# Patient Record
Sex: Male | Born: 1935
Health system: Southern US, Community
[De-identification: ages and names within clinical notes are randomized; demographics above are authoritative.]

## PROBLEM LIST (undated history)

## (undated) ENCOUNTER — Emergency Department (HOSPITAL_COMMUNITY): Admission: EM | Disposition: A | Discharge status: Home / Self Care | Payer: Self-pay

## (undated) DIAGNOSIS — I480 Paroxysmal atrial fibrillation: Secondary | ICD-10-CM

## (undated) DIAGNOSIS — E66811 Obesity, class 1: Secondary | ICD-10-CM

## (undated) DIAGNOSIS — K219 Gastro-esophageal reflux disease without esophagitis: Secondary | ICD-10-CM

## (undated) DIAGNOSIS — I252 Old myocardial infarction: Secondary | ICD-10-CM

## (undated) DIAGNOSIS — I119 Hypertensive heart disease without heart failure: Secondary | ICD-10-CM

## (undated) DIAGNOSIS — I4891 Unspecified atrial fibrillation: Secondary | ICD-10-CM

## (undated) DIAGNOSIS — Z9289 Personal history of other medical treatment: Secondary | ICD-10-CM

## (undated) DIAGNOSIS — I2510 Atherosclerotic heart disease of native coronary artery without angina pectoris: Secondary | ICD-10-CM

## (undated) DIAGNOSIS — I2699 Other pulmonary embolism without acute cor pulmonale: Secondary | ICD-10-CM

## (undated) DIAGNOSIS — E669 Obesity, unspecified: Secondary | ICD-10-CM

## (undated) HISTORY — DX: Atherosclerotic heart disease of native coronary artery without angina pectoris: I25.10

## (undated) HISTORY — DX: Gastro-esophageal reflux disease without esophagitis: K21.9

## (undated) HISTORY — PX: ACHILLES TENDON REPAIR: SUR1153

## (undated) HISTORY — DX: Old myocardial infarction: I25.2

## (undated) HISTORY — DX: Other pulmonary embolism without acute cor pulmonale: I26.99

## (undated) HISTORY — DX: Paroxysmal atrial fibrillation: I48.0

## (undated) HISTORY — PX: SHOULDER SURGERY: SHX246

## (undated) HISTORY — DX: Obesity, class 1: E66.811

## (undated) HISTORY — DX: Obesity, unspecified: E66.9

## (undated) HISTORY — DX: Hypertensive heart disease without heart failure: I11.9

## (undated) HISTORY — PX: APPENDECTOMY: SHX54

---

## 1993-03-08 DIAGNOSIS — I252 Old myocardial infarction: Secondary | ICD-10-CM

## 1993-03-08 HISTORY — DX: Old myocardial infarction: I25.2

## 2004-07-06 ENCOUNTER — Encounter: Admission: RE | Admit: 2004-07-06 | Discharge: 2004-07-06 | Payer: Self-pay | Admitting: Cardiology

## 2004-07-09 ENCOUNTER — Inpatient Hospital Stay (HOSPITAL_BASED_OUTPATIENT_CLINIC_OR_DEPARTMENT_OTHER): Admission: RE | Admit: 2004-07-09 | Discharge: 2004-07-09 | Payer: Self-pay | Admitting: Cardiology

## 2009-04-08 ENCOUNTER — Ambulatory Visit (HOSPITAL_COMMUNITY): Admission: RE | Admit: 2009-04-08 | Discharge: 2009-04-08 | Payer: Self-pay | Admitting: Urology

## 2010-04-05 ENCOUNTER — Encounter
Admission: RE | Admit: 2010-04-05 | Discharge: 2010-04-05 | Payer: Self-pay | Source: Home / Self Care | Attending: Orthopaedic Surgery | Admitting: Orthopaedic Surgery

## 2010-07-24 NOTE — Cardiovascular Report (Signed)
NAME:  Richard [name], Richard [name] NO.:  [id number]   MEDICAL RECORD NO.:  [id number]          PATIENT TYPE:  OIB   LOCATION:  6501                         FACILITY:  [facility]   PHYSICIAN:  W. Ashley Royalty., M.D.DATE OF BIRTH:  23-Jul-1933   DATE OF PROCEDURE:  DATE OF DISCHARGE:                              CARDIAC CATHETERIZATION   INDICATIONS:  A 77 year old male with known coronary artery disease who  presents with increasing dyspnea.  He had a previous Cardiolite study one  year ago that showed some inferior ischemia and thought due to a previous  infarction and he has a known occlusion of the right coronary artery.   PROCEDURE:  Left heart catheterization with coronary angiograms and left  ventriculogram.   COMMENTS ABOUT PROCEDURE:  The procedure was done with 4 French catheters.  He tolerated the procedure well without complications and had good  hemostasis and peripheral pulses noted at the end of the procedure.  The  femoral artery was entered using a single anterior needle wall stick.   HEMODYNAMIC DATA:  The aorta post contrast 149/78 LV, post contrast 149/18.   ANGIOGRAPHIC DATA:  Left ventriculogram:  Performed in the 30 degree RAO  projection.  The aortic valve is normal.  The mitral valve is normal.  The  left ventricle appears normal in size.  The estimated ejection fraction is  60%.  Coronary arteries arise and distribute normally.  The left main  coronary artery has some aneurysmal change and has some mild tapering at the  proximal portion but is large and is free of disease.  The left anterior  descending is a large vessel which extends around the apex.  There appears  to be some aneurysmal disease in the proximal portion of the vessel with  moderate irregularity.  A large first diagonal branch arises and a series of  several other diagonal branches arise but are free of disease.  The  circumflex coronary artery has a small intermediate branch.  There  are two  marginal arteries that arise.  Collateral filling is seen at the distal  right coronary vessel.  The right coronary artery is occluded proximally  with bridging collaterals seen.  A well developed system of collaterals in  the left coronary artery is visible.   IMPRESSION:  1.  Coronary artery disease with occlusion of right coronary artery, some      aneurysmal disease and moderate irregularity of the left anterior      descending and circumflex without significant focal obstruction of these      vessels.  2.  Normal left ventricular function.   RECOMMENDATIONS:  Continue medical therapy.  Evaluate for other causes of  dyspnea.  Possibly consider stopping beta blockers or reducing beta blockers  to give a better heart rate response with exercise.       WST/MEDQ  D:  07/09/2004  T:  07/09/2004  Job:  [id number]   cc:   Fara Chute  [address] [place]  [place] [address]  Fax: [phone]

## 2011-06-29 DIAGNOSIS — E7800 Pure hypercholesterolemia, unspecified: Secondary | ICD-10-CM | POA: Diagnosis not present

## 2011-06-29 DIAGNOSIS — E119 Type 2 diabetes mellitus without complications: Secondary | ICD-10-CM | POA: Diagnosis not present

## 2011-06-29 DIAGNOSIS — I10 Essential (primary) hypertension: Secondary | ICD-10-CM | POA: Diagnosis not present

## 2011-07-06 DIAGNOSIS — E669 Obesity, unspecified: Secondary | ICD-10-CM | POA: Diagnosis not present

## 2011-07-06 DIAGNOSIS — M1990 Unspecified osteoarthritis, unspecified site: Secondary | ICD-10-CM | POA: Diagnosis not present

## 2011-07-06 DIAGNOSIS — I10 Essential (primary) hypertension: Secondary | ICD-10-CM | POA: Diagnosis not present

## 2011-07-06 DIAGNOSIS — E7800 Pure hypercholesterolemia, unspecified: Secondary | ICD-10-CM | POA: Diagnosis not present

## 2011-07-06 DIAGNOSIS — E119 Type 2 diabetes mellitus without complications: Secondary | ICD-10-CM | POA: Diagnosis not present

## 2011-07-06 DIAGNOSIS — I259 Chronic ischemic heart disease, unspecified: Secondary | ICD-10-CM | POA: Diagnosis not present

## 2011-10-14 DIAGNOSIS — M62838 Other muscle spasm: Secondary | ICD-10-CM | POA: Diagnosis not present

## 2011-10-14 DIAGNOSIS — M549 Dorsalgia, unspecified: Secondary | ICD-10-CM | POA: Diagnosis not present

## 2011-11-02 DIAGNOSIS — I10 Essential (primary) hypertension: Secondary | ICD-10-CM | POA: Diagnosis not present

## 2011-11-02 DIAGNOSIS — E7800 Pure hypercholesterolemia, unspecified: Secondary | ICD-10-CM | POA: Diagnosis not present

## 2011-11-02 DIAGNOSIS — E119 Type 2 diabetes mellitus without complications: Secondary | ICD-10-CM | POA: Diagnosis not present

## 2011-11-09 DIAGNOSIS — L570 Actinic keratosis: Secondary | ICD-10-CM | POA: Diagnosis not present

## 2011-11-09 DIAGNOSIS — C44319 Basal cell carcinoma of skin of other parts of face: Secondary | ICD-10-CM | POA: Diagnosis not present

## 2011-11-09 DIAGNOSIS — D235 Other benign neoplasm of skin of trunk: Secondary | ICD-10-CM | POA: Diagnosis not present

## 2011-11-11 DIAGNOSIS — E7800 Pure hypercholesterolemia, unspecified: Secondary | ICD-10-CM | POA: Diagnosis not present

## 2011-11-11 DIAGNOSIS — E119 Type 2 diabetes mellitus without complications: Secondary | ICD-10-CM | POA: Diagnosis not present

## 2011-11-11 DIAGNOSIS — I10 Essential (primary) hypertension: Secondary | ICD-10-CM | POA: Diagnosis not present

## 2011-11-11 DIAGNOSIS — M1990 Unspecified osteoarthritis, unspecified site: Secondary | ICD-10-CM | POA: Diagnosis not present

## 2011-11-11 DIAGNOSIS — E669 Obesity, unspecified: Secondary | ICD-10-CM | POA: Diagnosis not present

## 2011-11-15 DIAGNOSIS — J019 Acute sinusitis, unspecified: Secondary | ICD-10-CM | POA: Diagnosis not present

## 2011-11-15 DIAGNOSIS — J209 Acute bronchitis, unspecified: Secondary | ICD-10-CM | POA: Diagnosis not present

## 2011-12-21 DIAGNOSIS — Z85828 Personal history of other malignant neoplasm of skin: Secondary | ICD-10-CM | POA: Diagnosis not present

## 2011-12-23 DIAGNOSIS — Z23 Encounter for immunization: Secondary | ICD-10-CM | POA: Diagnosis not present

## 2012-01-21 ENCOUNTER — Inpatient Hospital Stay (HOSPITAL_COMMUNITY)
Admission: AD | Admit: 2012-01-21 | Discharge: 2012-01-24 | DRG: 287 | Disposition: A | Discharge status: Home / Self Care | Payer: Medicare Other | Source: Other Acute Inpatient Hospital | Attending: Cardiology | Admitting: Cardiology

## 2012-01-21 ENCOUNTER — Encounter: Payer: Self-pay | Admitting: Cardiology

## 2012-01-21 DIAGNOSIS — Z9089 Acquired absence of other organs: Secondary | ICD-10-CM | POA: Diagnosis not present

## 2012-01-21 DIAGNOSIS — Z7982 Long term (current) use of aspirin: Secondary | ICD-10-CM

## 2012-01-21 DIAGNOSIS — E119 Type 2 diabetes mellitus without complications: Secondary | ICD-10-CM | POA: Diagnosis present

## 2012-01-21 DIAGNOSIS — Z79899 Other long term (current) drug therapy: Secondary | ICD-10-CM | POA: Diagnosis not present

## 2012-01-21 DIAGNOSIS — R109 Unspecified abdominal pain: Secondary | ICD-10-CM | POA: Diagnosis not present

## 2012-01-21 DIAGNOSIS — K219 Gastro-esophageal reflux disease without esophagitis: Secondary | ICD-10-CM | POA: Diagnosis present

## 2012-01-21 DIAGNOSIS — J9819 Other pulmonary collapse: Secondary | ICD-10-CM | POA: Diagnosis not present

## 2012-01-21 DIAGNOSIS — I119 Hypertensive heart disease without heart failure: Secondary | ICD-10-CM | POA: Diagnosis present

## 2012-01-21 DIAGNOSIS — R141 Gas pain: Secondary | ICD-10-CM | POA: Diagnosis not present

## 2012-01-21 DIAGNOSIS — E669 Obesity, unspecified: Secondary | ICD-10-CM | POA: Diagnosis present

## 2012-01-21 DIAGNOSIS — I2582 Chronic total occlusion of coronary artery: Secondary | ICD-10-CM | POA: Diagnosis present

## 2012-01-21 DIAGNOSIS — Z888 Allergy status to other drugs, medicaments and biological substances status: Secondary | ICD-10-CM

## 2012-01-21 DIAGNOSIS — E785 Hyperlipidemia, unspecified: Secondary | ICD-10-CM | POA: Diagnosis present

## 2012-01-21 DIAGNOSIS — I10 Essential (primary) hypertension: Secondary | ICD-10-CM | POA: Diagnosis not present

## 2012-01-21 DIAGNOSIS — I200 Unstable angina: Secondary | ICD-10-CM

## 2012-01-21 DIAGNOSIS — Z87891 Personal history of nicotine dependence: Secondary | ICD-10-CM

## 2012-01-21 DIAGNOSIS — I252 Old myocardial infarction: Secondary | ICD-10-CM

## 2012-01-21 DIAGNOSIS — M79609 Pain in unspecified limb: Secondary | ICD-10-CM | POA: Diagnosis not present

## 2012-01-21 DIAGNOSIS — I509 Heart failure, unspecified: Secondary | ICD-10-CM | POA: Diagnosis not present

## 2012-01-21 DIAGNOSIS — I2510 Atherosclerotic heart disease of native coronary artery without angina pectoris: Secondary | ICD-10-CM | POA: Insufficient documentation

## 2012-01-21 DIAGNOSIS — I498 Other specified cardiac arrhythmias: Secondary | ICD-10-CM | POA: Diagnosis present

## 2012-01-21 DIAGNOSIS — Z6832 Body mass index (BMI) 32.0-32.9, adult: Secondary | ICD-10-CM | POA: Diagnosis not present

## 2012-01-21 DIAGNOSIS — R0789 Other chest pain: Secondary | ICD-10-CM | POA: Diagnosis not present

## 2012-01-21 LAB — COMPREHENSIVE METABOLIC PANEL
ALT: 23 U/L (ref 0–53)
BUN: 15 mg/dL (ref 6–23)
CO2: 25 mEq/L (ref 19–32)
Calcium: 9 mg/dL (ref 8.4–10.5)
Creatinine, Ser: 0.8 mg/dL (ref 0.50–1.35)
GFR calc Af Amer: >90 mL/min (ref >90)
GFR calc non Af Amer: 85 mL/min — ABNORMAL LOW (ref >90)
Glucose, Bld: 150 mg/dL — ABNORMAL HIGH (ref 70–99)
Total Protein: 6.7 g/dL (ref 6.0–8.3)

## 2012-01-21 LAB — TROPONIN I: Troponin I: <0.3 ng/mL (ref <0.30)

## 2012-01-21 LAB — PROTIME-INR
INR: 1.03 (ref 0.00–1.49)
Prothrombin Time: 13.4 seconds (ref 11.6–15.2)

## 2012-01-21 MED ORDER — HEPARIN (PORCINE) IN NACL 100-0.45 UNIT/ML-% IJ SOLN
1750.0000 [IU]/h | INTRAMUSCULAR | Status: DC
  Administered 2012-01-21: 1350 [IU]/h via INTRAVENOUS
  Administered 2012-01-22: 1700 [IU]/h via INTRAVENOUS
  Administered 2012-01-23: 1750 [IU]/h via INTRAVENOUS
  Filled 2012-01-21 (×6): qty 250

## 2012-01-21 MED ORDER — IRBESARTAN 150 MG PO TABS
150.0000 mg | ORAL_TABLET | Freq: Every day | ORAL | Status: DC
  Administered 2012-01-22 – 2012-01-24 (×3): 150 mg via ORAL
  Filled 2012-01-21 (×3): qty 1

## 2012-01-21 MED ORDER — ACETAMINOPHEN 325 MG PO TABS: 650.0000 mg | ORAL_TABLET | ORAL | Status: DC | PRN

## 2012-01-21 MED ORDER — ATENOLOL 12.5 MG HALF TABLET
12.5000 mg | ORAL_TABLET | Freq: Every day | ORAL | Status: DC
  Administered 2012-01-22 – 2012-01-24 (×3): 12.5 mg via ORAL
  Filled 2012-01-21 (×3): qty 1

## 2012-01-21 MED ORDER — HEPARIN BOLUS VIA INFUSION
4000.0000 [IU] | Freq: Once | INTRAVENOUS | Status: AC
  Administered 2012-01-21: 4000 [IU] via INTRAVENOUS
  Filled 2012-01-21: qty 4000

## 2012-01-21 MED ORDER — SODIUM CHLORIDE 0.9 % IV SOLN
250.0000 mL | INTRAVENOUS | Status: DC | PRN
  Administered 2012-01-21: 250 mL via INTRAVENOUS

## 2012-01-21 MED ORDER — NITROGLYCERIN 0.4 MG SL SUBL: 0.4000 mg | SUBLINGUAL_TABLET | SUBLINGUAL | Status: DC | PRN

## 2012-01-21 MED ORDER — ONDANSETRON HCL 4 MG/2ML IJ SOLN: 4.0000 mg | Freq: Four times a day (QID) | INTRAMUSCULAR | Status: DC | PRN

## 2012-01-21 MED ORDER — ATORVASTATIN CALCIUM 10 MG PO TABS
10.0000 mg | ORAL_TABLET | Freq: Every evening | ORAL | Status: DC
  Administered 2012-01-21 – 2012-01-23 (×3): 10 mg via ORAL
  Filled 2012-01-21 (×4): qty 1

## 2012-01-21 MED ORDER — SODIUM CHLORIDE 0.9 % IJ SOLN
3.0000 mL | Freq: Two times a day (BID) | INTRAMUSCULAR | Status: DC
  Administered 2012-01-21 – 2012-01-23 (×2): 3 mL via INTRAVENOUS

## 2012-01-21 MED ORDER — SODIUM CHLORIDE 0.9 % IJ SOLN: 3.0000 mL | INTRAMUSCULAR | Status: DC | PRN

## 2012-01-21 MED ORDER — METFORMIN HCL ER 500 MG PO TB24
500.0000 mg | ORAL_TABLET | Freq: Every day | ORAL | Status: DC
  Administered 2012-01-22 – 2012-01-23 (×2): 500 mg via ORAL
  Filled 2012-01-21 (×6): qty 1

## 2012-01-21 MED ORDER — ASPIRIN EC 81 MG PO TBEC
81.0000 mg | DELAYED_RELEASE_TABLET | Freq: Every day | ORAL | Status: DC
  Administered 2012-01-22 – 2012-01-24 (×3): 81 mg via ORAL
  Filled 2012-01-21 (×3): qty 1

## 2012-01-21 NOTE — Progress Notes (Signed)
ANTICOAGULATION CONSULT NOTE - Initial Consult  Pharmacy Consult for heparin Indication: chest pain/ACS  Allergies  Allergen Reactions  . Metoprolol     fatigue  . Niaspan (Niacin Er)     flushing    Patient Measurements: Height: 5\' 8"  (172.7 cm) Weight: 212 lb 8.4 oz (96.4 kg) IBW/kg (Calculated) : 68.4  Heparin Dosing Weight: 89 kg  Vital Signs: Temp: 98.1 F (36.7 C) (11/15 1742) Temp src: Oral (11/15 1742) BP: 159/83 mmHg (11/15 1742) Pulse Rate: 54  (11/15 1742)  Labs: No results found for this basename: HGB:2,HCT:3,PLT:3,APTT:3,LABPROT:3,INR:3,HEPARINUNFRC:3,CREATININE:3,CKTOTAL:3,CKMB:3,TROPONINI:3 in the last 72 hours  CrCl is unknown because no creatinine reading has been taken.   Medical History: Past Medical History  Diagnosis Date  . Hypertensive heart disease   . GERD (gastroesophageal reflux disease)   . Obesity (BMI 30.0-34.9)   . CAD (coronary artery disease)   . Old inferior myocardial infarction 1995    Medications:  Prescriptions prior to admission  Medication Sig Dispense Refill  . aspirin EC 81 MG tablet Take 81 mg by mouth daily.      Marland Kitchen atenolol (TENORMIN) 25 MG tablet Take 12.5 mg by mouth daily.      Marland Kitchen atorvastatin (LIPITOR) 10 MG tablet Take 10 mg by mouth every evening.      . naproxen sodium (ANAPROX) 220 MG tablet Take 440 mg by mouth daily as needed. For pain      . valsartan (DIOVAN) 160 MG tablet Take 160 mg by mouth daily.      . metFORMIN (GLUCOPHAGE-XR) 500 MG 24 hr tablet Take 500 mg by mouth daily with breakfast.        Assessment: 78 yo male with known hx CAD admitted with chest pain, awaiting cath.  Pharmacy asked to begin anticoagulation with IV heparin.  No anticoagulants PTA, baseline labs pending.  Goal of Therapy:  Heparin level 0.3-0.7 units/ml Monitor platelets by anticoagulation protocol: Yes   Plan:  1. Heparin 4000 unit IV bolus x 1. 2. Then start IV heparin gtt at 1350 units/hr. 3. Check heparin level 8  hrs after gtt starts. 4. Daily heparin level and CBC.  Orren Pietsch C 01/21/2012,7:10 PM

## 2012-01-21 NOTE — Progress Notes (Signed)
Pt arrived to unit from [facility]. VSS, BP: 159/83, P: 54, T: 98.1, R:18, 97 % RA. Pt comfortable and resting.

## 2012-01-21 NOTE — Progress Notes (Signed)
MD called upon arrival for orders.

## 2012-01-21 NOTE — H&P (Signed)
History and Physical   Admit date: 01/21/2012 Name:  Richard [name] Medical record number: [id number] DOB/Age:  Feb 14, 1934  78 y.o. male  Referring Physician:  [facility]  Primary Cardiologist:  Dr. Viann Fish  Primary Physician: Dr. Fara Chute  Chief complaint/reason for admission: Arm discomfort  HPI:  This 78 year old male has a history of an inferior infarction in 1995. He was treated medically at the time and he is subsequently had cardiac catheterization showing an occluded right coronary artery and a 50% diagonal stenosis. He has normally been asymptomatic without significant angina. Today he was sitting and had the onset of excessive belching associated with left arm discomfort which involved his entire left arm similar to his previous symptoms of his myocardial infarction except that it was his left arm instead of the right. The discomfort intensified. It was not associated with sweating or shortness of breath and not associated with mid sternal chest discomfort. [facility] was [facility] room where he was given aspirin and 2 nitroglycerin with ultimate relief after about 2 hours of pain. He was transferred to [facility] for further evaluation. He is currently pain-free. He has noted some mild dyspnea with exertion. He normally has no PND, orthopnea, syncope, or claudication. He does not normally have any significant angina.   Past Medical History  Diagnosis Date  . Hypertensive heart disease   . GERD (gastroesophageal reflux disease)   . Obesity (BMI 30.0-34.9)   . CAD (coronary artery disease)   . Old inferior myocardial infarction 1995     Past Surgical History  Procedure Date  . Appendectomy   . Shoulder surgery   . Achilles tendon repair    Allergies: is allergic to metoprolol and niaspan.   Medications: Prior to Admission medications   Medication Sig Start Date End Date Taking? Authorizing Provider  aspirin EC 81 MG  tablet Take 81 mg by mouth daily.   Yes Historical Provider, MD  atenolol (TENORMIN) 25 MG tablet Take 12.5 mg by mouth daily.   Yes Historical Provider, MD  atorvastatin (LIPITOR) 10 MG tablet Take 10 mg by mouth every evening.   Yes Historical Provider, MD  naproxen sodium (ANAPROX) 220 MG tablet Take 440 mg by mouth daily as needed. For pain   Yes Historical Provider, MD  valsartan (DIOVAN) 160 MG tablet Take 160 mg by mouth daily.   Yes Historical Provider, MD  metFORMIN (GLUCOPHAGE-XR) 500 MG 24 hr tablet Take 500 mg by mouth daily with breakfast.    Historical Provider, MD    Family History:  Family Status  Relation Status Death Age  . Father Deceased 61    suicide  . Mother Deceased 32    died of CHF  . Sister Alive     Social History:   reports that he quit smoking about 18 years ago. He has never used smokeless tobacco. He reports that he does not drink alcohol or use illicit drugs.   History   Social History Narrative   Married, retired [employer].  Works part time at the [employer]     Review of Systems: He is been obese for several years. He has had significant low back pain and has had some injections in his low back. He normally has some mild dyspnea. He has some mild indigestion. Mild nocturia. Occasional headaches. Other than as noted above, the remainder of the review of systems is normal  Physical Exam: BP 159/83  Pulse 54  Temp 98.1 F (36.7 C) (Oral)  Resp 18  Ht 5\' 8"  (1.727 m)  Wt 96.4 kg (212 lb 8.4 oz)  BMI 32.31 kg/m2  SpO2 97% General appearance: alert, cooperative, appears stated age and moderately obese Head: Normocephalic, without obvious abnormality, atraumatic Eyes: conjunctivae/corneas clear. PERRL, EOM's intact. Fundi benign. Neck: no adenopathy, no carotid bruit, no JVD and supple, symmetrical, trachea midline Lungs: clear to auscultation bilaterally Heart: regular rate and rhythm, S1, S2 normal, no murmur, click, rub or  gallop Abdomen: soft, non-tender; bowel sounds normal; no masses,  no organomegaly Rectal: deferred Extremities: extremities normal, atraumatic, no cyanosis or edema Pulses: 2+ and symmetric Skin: Skin color, texture, turgor normal. No rashes or lesions Neurologic: Grossly normal   Labs: EKG: Sinus bradycardia, nonspecific ST changes   IMPRESSIONS: 1. Unstable angina 2. Coronary artery disease with previous inferior infarction 3. Sinus bradycardia 4. Diabetes mellitus 5. Hyperlipidemia 6. Obesity  PLAN: Admit to telemetry bed. Intravenous heparin, we will hold metformin and a day or so in anticipation of cardiac catheterization. He has a previously abnormal nuclear scan so noninvasive testing would not be useful.  Signed: Darden Palmer MD [facility] Cardiology  01/21/2012, 6:37 PM

## 2012-01-22 ENCOUNTER — Encounter (HOSPITAL_COMMUNITY): Payer: Self-pay | Admitting: *Deleted

## 2012-01-22 LAB — HEMOGLOBIN A1C
Hgb A1c MFr Bld: 6.5 % — ABNORMAL HIGH (ref <5.7)
Mean Plasma Glucose: 140 mg/dL — ABNORMAL HIGH (ref <117)

## 2012-01-22 LAB — CBC
HCT: 41.9 % (ref 39.0–52.0)
MCH: 31.2 pg (ref 26.0–34.0)
MCV: 93.9 fL (ref 78.0–100.0)
RDW: 13.6 % (ref 11.5–15.5)
WBC: 7 10*3/uL (ref 4.0–10.5)

## 2012-01-22 LAB — GLUCOSE, CAPILLARY: Glucose-Capillary: 157 mg/dL — ABNORMAL HIGH (ref 70–99)

## 2012-01-22 LAB — LIPID PANEL
HDL: 36 mg/dL — ABNORMAL LOW (ref >39)
LDL Cholesterol: 81 mg/dL (ref 0–99)

## 2012-01-22 LAB — HEPARIN LEVEL (UNFRACTIONATED): Heparin Unfractionated: 0.4 IU/mL (ref 0.30–0.70)

## 2012-01-22 MED ORDER — HEPARIN BOLUS VIA INFUSION
2500.0000 [IU] | Freq: Once | INTRAVENOUS | Status: AC
  Administered 2012-01-22: 2500 [IU] via INTRAVENOUS
  Filled 2012-01-22: qty 2500

## 2012-01-22 NOTE — Progress Notes (Signed)
Admit Complaint: 48 yoM Tx to [facility] from [facility] 11/15 with excessive belching and L arm discomfort- to be evaluated for UA. Pain alleviated in [facility] ED with ASA and NTG.  AC: none PTA, heparin gtt per Rx for CP/ACS. HL in therapeutic range at 0.40 on 1700 units/hr. H/H ok, plts low at 129. Baseline INR 1.03  Plan: -Increase heparin gtt to 1750 units/hr to keep in therapeutic range -8 hour HL to confirm -Daily HL and CBC -F/u s/sx bleeding  ANTICOAGULATION CONSULT NOTE - Follow Up Consult  Pharmacy Consult for heparin Indication: chest pain/ACS  Allergies  Allergen Reactions  . Metoprolol     fatigue  . Niaspan (Niacin Er)     flushing    Patient Measurements: Height: 5\' 8"  (172.7 cm) Weight: 212 lb 8.4 oz (96.4 kg) IBW/kg (Calculated) : 68.4  Heparin Dosing Weight: 89 kg  Vital Signs: Temp: 98.5 F (36.9 C) (11/16 1408) Temp src: Oral (11/16 1408) BP: 116/71 mmHg (11/16 1408) Pulse Rate: 56  (11/16 1408)  Labs:  Basename 01/22/12 1406 01/22/12 0515 01/22/12 0010 01/21/12 2030 01/21/12 2028  HGB -- 13.9 -- -- --  HCT -- 41.9 -- -- --  PLT -- 129* -- -- --  APTT -- -- -- -- 30  LABPROT -- -- -- -- 13.4  INR -- -- -- -- 1.03  HEPARINUNFRC 0.40 0.13* -- -- --  CREATININE -- -- -- -- 0.80  CKTOTAL -- -- -- -- --  CKMB -- -- -- -- --  TROPONINI -- <0.30 <0.30 <0.30 --    Estimated Creatinine Clearance: 88.4 ml/min (by C-G formula based on Cr of 0.8).    Assessment: 78 yoM Tx to [facility] from [facility] 11/15 with excessive belching and L arm discomfort- to be evaluated for UA. Pain alleviated in [facility] ED with ASA and NTG.  No anticoagulation PTA. Heparin gtt per Rx for CP/ACS. HL in therapeutic range at 0.40 on 1700 units/hr. H/H ok, plts low at 129. Baseline INR 1.03  Goal of Therapy:  Heparin level 0.3-0.7 units/ml Monitor platelets by anticoagulation protocol: Yes   Plan:  -Increase heparin gtt to 1750 units/hr to keep in therapeutic range -8 hour HL to  confirm -Daily HL and CBC -F/u s/sx bleeding  Thank you for the consult.  Tomi Bamberger, PharmD Clinical Pharmacist Pager: [phone] Pharmacy: [phone] 01/22/2012 3:11 PM

## 2012-01-22 NOTE — Progress Notes (Signed)
ANTICOAGULATION CONSULT NOTE - Follow Up Consult  Pharmacy Consult for heparin Indication: chest pain/ACS  Allergies  Allergen Reactions  . Metoprolol     fatigue  . Niaspan (Niacin Er)     flushing    Patient Measurements: Height: 5\' 8"  (172.7 cm) Weight: 212 lb 8.4 oz (96.4 kg) IBW/kg (Calculated) : 68.4  Heparin Dosing Weight: 89 kg  Vital Signs: Temp: 98.3 F (36.8 C) (11/15 2100) BP: 140/84 mmHg (11/15 2100) Pulse Rate: 51  (11/15 2100)  Labs:  Basename 01/22/12 0515 01/22/12 0010 01/21/12 2030 01/21/12 2028  HGB 13.9 -- -- --  HCT 41.9 -- -- --  PLT 129* -- -- --  APTT -- -- -- 30  LABPROT -- -- -- 13.4  INR -- -- -- 1.03  HEPARINUNFRC 0.13* -- -- --  CREATININE -- -- -- 0.80  CKTOTAL -- -- -- --  CKMB -- -- -- --  TROPONINI -- <0.30 <0.30 --    Estimated Creatinine Clearance: 88.4 ml/min (by C-G formula based on Cr of 0.8).   Medical History: Past Medical History  Diagnosis Date  . Hypertensive heart disease   . GERD (gastroesophageal reflux disease)   . Obesity (BMI 30.0-34.9)   . CAD (coronary artery disease)   . Old inferior myocardial infarction 1995    Medications:  Prescriptions prior to admission  Medication Sig Dispense Refill  . aspirin EC 81 MG tablet Take 81 mg by mouth daily.      Marland Kitchen atenolol (TENORMIN) 25 MG tablet Take 12.5 mg by mouth daily.      Marland Kitchen atorvastatin (LIPITOR) 10 MG tablet Take 10 mg by mouth every evening.      . naproxen sodium (ANAPROX) 220 MG tablet Take 440 mg by mouth daily as needed. For pain      . valsartan (DIOVAN) 160 MG tablet Take 160 mg by mouth daily.      . metFORMIN (GLUCOPHAGE-XR) 500 MG 24 hr tablet Take 500 mg by mouth daily with breakfast.        Assessment: 78 yo male with known hx CAD admitted with chest pain, awaiting cath.  Pharmacy asked to manage anticoagulation with IV heparin. Heparin level (0.13) is below-goal on 1350 units/hr.   Goal of Therapy:  Heparin level 0.3-0.7 units/ml Monitor  platelets by anticoagulation protocol: Yes   Plan:  1. Heparin 2500 unit IV bolus x 1, then increase IV infusion to 1700 units/hr 2. Heparin level in 8 hours.   Emeline Gins 01/22/2012,6:31 AM

## 2012-01-22 NOTE — Progress Notes (Signed)
Subjective:  No recurrent arm pain overnight.  Enzymes negative. No arrhythmias.  Objective:  Vital Signs in the last 24 hours: BP 116/71  Pulse 58  Temp 98.3 F (36.8 C) (Oral)  Resp 15  Ht 5\' 8"  (1.727 m)  Wt 96.4 kg (212 lb 8.4 oz)  BMI 32.31 kg/m2  SpO2 97%  Physical Exam: Pleasant WM in NAD Lungs:  Clear  Cardiac:  Regular rhythm, normal S1 and S2, no S3 Extremities:  No edema present  Intake/Output from previous day:    Weight Filed Weights   01/21/12 1742  Weight: 96.4 kg (212 lb 8.4 oz)    Lab Results: Basic Metabolic Panel:  [facility] 01/21/12 2028  NA 138  K 3.8  CL 103  CO2 25  GLUCOSE 150*  BUN 15  CREATININE 0.80   CBC:  Basename 01/22/12 0515  WBC 7.0  NEUTROABS --  HGB 13.9  HCT 41.9  MCV 93.9  PLT 129*   Cardiac Enzymes:  Basename 01/22/12 0515 01/22/12 0010 01/21/12 2030  CKTOTAL -- -- --  CKMB -- -- --  CKMBINDEX -- -- --  TROPONINI <0.30 <0.30 <0.30    Telemetry: Sinus bradycardia  Assessment/Plan: 1. Unstable angina 2. CAD 3. Diabetes  Rec:  Stable on IV heparin, cath Monday. Cardiac catheterization was discussed with the patient fully including risks of myocardial infarction, death, stroke, bleeding, arrhythmia, dye allergy, renal insufficiency or bleeding.  The patient understands and is willing to proceed.    Darden Palmer  MD [facility] Cardiology  01/22/2012, 10:37 AM

## 2012-01-23 LAB — CBC
Hemoglobin: 14.7 g/dL (ref 13.0–17.0)
MCH: 31.3 pg (ref 26.0–34.0)
MCV: 94.3 fL (ref 78.0–100.0)
Platelets: 127 10*3/uL — ABNORMAL LOW (ref 150–400)
RBC: 4.7 MIL/uL (ref 4.22–5.81)

## 2012-01-23 LAB — GLUCOSE, CAPILLARY
Glucose-Capillary: 136 mg/dL — ABNORMAL HIGH (ref 70–99)
Glucose-Capillary: 142 mg/dL — ABNORMAL HIGH (ref 70–99)

## 2012-01-23 MED ORDER — DIAZEPAM 5 MG PO TABS
10.0000 mg | ORAL_TABLET | ORAL | Status: AC
  Administered 2012-01-24: 10 mg via ORAL
  Filled 2012-01-23: qty 2

## 2012-01-23 MED ORDER — SODIUM CHLORIDE 0.9 % IV SOLN
1.0000 mL/kg/h | INTRAVENOUS | Status: DC
  Administered 2012-01-24: 1 mL/kg/h via INTRAVENOUS

## 2012-01-23 NOTE — Progress Notes (Signed)
Subjective:  No recurrent arm pain overnight.  Enzymes negative. No arrhythmias.  Objective:  Vital Signs in the last 24 hours: BP 129/79  Pulse 55  Temp 97.8 F (36.6 C) (Oral)  Resp 17  Ht 5\' 8"  (1.727 m)  Wt 95.981 kg (211 lb 9.6 oz)  BMI 32.17 kg/m2  SpO2 95%  Physical Exam: Pleasant WM in NAD Lungs:  Clear  Cardiac:  Regular rhythm, normal S1 and S2, no S3 Extremities:  No edema present  Intake/Output from previous day: 11/16 0701 - 11/17 0700 In: 1775.7 [P.O.:1440; I.V.:335.7] Out: 2900 [Urine:2900]  Weight Filed Weights   01/21/12 1742 01/23/12 0546  Weight: 96.4 kg (212 lb 8.4 oz) 95.981 kg (211 lb 9.6 oz)    Lab Results: Basic Metabolic Panel:  [facility] 01/21/12 2028  NA 138  K 3.8  CL 103  CO2 25  GLUCOSE 150*  BUN 15  CREATININE 0.80   CBC:  Basename 01/23/12 0540 01/22/12 0515  WBC 7.5 7.0  NEUTROABS -- --  HGB 14.7 13.9  HCT 44.3 41.9  MCV 94.3 93.9  PLT 127* 129*   Cardiac Enzymes:  Basename 01/22/12 0515 01/22/12 0010 01/21/12 2030  CKTOTAL -- -- --  CKMB -- -- --  CKMBINDEX -- -- --  TROPONINI <0.30 <0.30 <0.30    Telemetry: Sinus bradycardia  Assessment/Plan: 1. Unstable angina 2. CAD 3. Diabetes  Rec:  Stable on IV heparin, cath in am. Cardiac catheterization was discussed with the patient fully including risks of myocardial infarction, death, stroke, bleeding, arrhythmia, dye allergy, renal insufficiency or bleeding.  The patient understands and is willing to proceed.    Darden Palmer  MD [facility] Cardiology  01/23/2012, 11:57 AM

## 2012-01-23 NOTE — Progress Notes (Signed)
Pt with diabetes.  CBG's being monitored.  Metformin will be held tomorrow for cath.  Notified Dr. Donnie Aho.  No new orders rec'd.  Will continue to monitor.  Vanna Sailer, [facility]

## 2012-01-23 NOTE — Progress Notes (Signed)
ANTICOAGULATION CONSULT NOTE - Follow Up Consult  Pharmacy Consult for heparin Indication: chest pain/ACS  Allergies  Allergen Reactions  . Metoprolol     fatigue  . Niaspan (Niacin Er)     flushing    Patient Measurements: Height: 5\' 8"  (172.7 cm) Weight: 212 lb 8.4 oz (96.4 kg) IBW/kg (Calculated) : 68.4  Heparin Dosing Weight: 89 kg  Vital Signs: Temp: 98.2 F (36.8 C) (11/16 2059) Temp src: Oral (11/16 2059) BP: 118/71 mmHg (11/16 2059) Pulse Rate: 53  (11/16 2059)  Labs:  Basename 01/22/12 2357 01/22/12 1406 01/22/12 0515 01/22/12 0010 01/21/12 2030 01/21/12 2028  HGB -- -- 13.9 -- -- --  HCT -- -- 41.9 -- -- --  PLT -- -- 129* -- -- --  APTT -- -- -- -- -- 30  LABPROT -- -- -- -- -- 13.4  INR -- -- -- -- -- 1.03  HEPARINUNFRC 0.48 0.40 0.13* -- -- --  CREATININE -- -- -- -- -- 0.80  CKTOTAL -- -- -- -- -- --  CKMB -- -- -- -- -- --  TROPONINI -- -- <0.30 <0.30 <0.30 --    Estimated Creatinine Clearance: 88.4 ml/min (by C-G formula based on Cr of 0.8).   Medical History: Past Medical History  Diagnosis Date  . Hypertensive heart disease   . GERD (gastroesophageal reflux disease)   . Obesity (BMI 30.0-34.9)   . CAD (coronary artery disease)   . Old inferior myocardial infarction 1995    Medications:  Prescriptions prior to admission  Medication Sig Dispense Refill  . aspirin EC 81 MG tablet Take 81 mg by mouth daily.      Marland Kitchen atenolol (TENORMIN) 25 MG tablet Take 12.5 mg by mouth daily.      Marland Kitchen atorvastatin (LIPITOR) 10 MG tablet Take 10 mg by mouth every evening.      . naproxen sodium (ANAPROX) 220 MG tablet Take 440 mg by mouth daily as needed. For pain      . valsartan (DIOVAN) 160 MG tablet Take 160 mg by mouth daily.      . metFORMIN (GLUCOPHAGE-XR) 500 MG 24 hr tablet Take 500 mg by mouth daily with breakfast.        Assessment: 78 yo male with known hx CAD admitted with chest pain, awaiting cath on Monday.  Pharmacy asked to manage  anticoagulation with IV heparin. Heparin level (0.48) is at-goal on 1750 units/hr.   Goal of Therapy:  Heparin level 0.3-0.7 units/ml Monitor platelets by anticoagulation protocol: Yes   Plan:  1. Continue IV heparin at 1750 units/hr. 2. Daily CBC, heparin level.  Emeline Gins 01/23/2012,12:47 AM

## 2012-01-23 NOTE — Progress Notes (Signed)
ANTICOAGULATION CONSULT NOTE - Follow Up Consult  Pharmacy Consult for Heparin Indication: chest pain/ACS  Allergies  Allergen Reactions  . Metoprolol     fatigue  . Niaspan (Niacin Er)     flushing    Patient Measurements: Height: 5\' 8"  (172.7 cm) Weight: 211 lb 9.6 oz (95.981 kg) IBW/kg (Calculated) : 68.4  Heparin Dosing Weight: 89 kg  Vital Signs: Temp: 97.8 F (36.6 C) (11/17 0546) Temp src: Oral (11/17 0546) BP: 129/79 mmHg (11/17 0546) Pulse Rate: 55  (11/17 0546)  Labs:  Basename 01/23/12 0540 01/22/12 2357 01/22/12 1406 01/22/12 0515 01/22/12 0010 01/21/12 2030 01/21/12 2028  HGB 14.7 -- -- 13.9 -- -- --  HCT 44.3 -- -- 41.9 -- -- --  PLT 127* -- -- 129* -- -- --  APTT -- -- -- -- -- -- 30  LABPROT -- -- -- -- -- -- 13.4  INR -- -- -- -- -- -- 1.03  HEPARINUNFRC 0.52 0.48 0.40 -- -- -- --  CREATININE -- -- -- -- -- -- 0.80  CKTOTAL -- -- -- -- -- -- --  CKMB -- -- -- -- -- -- --  TROPONINI -- -- -- <0.30 <0.30 <0.30 --    Estimated Creatinine Clearance: 88.2 ml/min (by C-G formula based on Cr of 0.8).    Assessment: 68 yoM Tx to [facility] from [facility] 11/15 with excessive belching and L arm discomfort- to be evaluated for unstable angina. Pain alleviated in [facility] ED with ASA and NTG.   No anticoagulation PTA. Heparin gtt per Rx for CP/ACS. Heparin level remains in therapeutic range at 0.52 on 1750 units/hr.  No infusion line/ bleeding issues per RN. H/H ok, plts low, stable at 127. Baseline INR 1.03  Goal of Therapy:  Heparin level 0.3-0.7 units/ml Monitor platelets by anticoagulation protocol: Yes   Plan:  -Continue heparin gtt at 1750 units/hr -Daily HL and CBC -F/u s/sx bleeding   Thank you for the consult.  Tomi Bamberger, PharmD Clinical Pharmacist Pager: [phone] Pharmacy: [phone] 01/23/2012 9:51 AM

## 2012-01-24 ENCOUNTER — Encounter (HOSPITAL_COMMUNITY)
Admission: AD | Disposition: A | Discharge status: Home / Self Care | Payer: Self-pay | Source: Other Acute Inpatient Hospital | Attending: Cardiology

## 2012-01-24 HISTORY — PX: LEFT HEART CATHETERIZATION WITH CORONARY ANGIOGRAM: SHX5451

## 2012-01-24 LAB — CBC
HCT: 44.1 % (ref 39.0–52.0)
Platelets: 128 10*3/uL — ABNORMAL LOW (ref 150–400)
RDW: 13.6 % (ref 11.5–15.5)
WBC: 7.4 10*3/uL (ref 4.0–10.5)

## 2012-01-24 LAB — HEPARIN LEVEL (UNFRACTIONATED): Heparin Unfractionated: 0.29 IU/mL — ABNORMAL LOW (ref 0.30–0.70)

## 2012-01-24 LAB — GLUCOSE, CAPILLARY: Glucose-Capillary: 136 mg/dL — ABNORMAL HIGH (ref 70–99)

## 2012-01-24 LAB — POCT ACTIVATED CLOTTING TIME: Activated Clotting Time: 130 seconds

## 2012-01-24 SURGERY — LEFT HEART CATHETERIZATION WITH CORONARY ANGIOGRAM
Anesthesia: LOCAL

## 2012-01-24 MED ORDER — SODIUM CHLORIDE 0.9 % IV SOLN
1.0000 mL/kg/h | INTRAVENOUS | Status: DC
  Administered 2012-01-24: 1 mL/kg/h via INTRAVENOUS

## 2012-01-24 MED ORDER — ACETAMINOPHEN 325 MG PO TABS: 650.0000 mg | ORAL_TABLET | ORAL | Status: DC | PRN

## 2012-01-24 MED ORDER — CLOPIDOGREL BISULFATE 75 MG PO TABS: 75.0000 mg | ORAL_TABLET | Freq: Every day | ORAL | Status: DC

## 2012-01-24 MED ORDER — LIDOCAINE HCL (PF) 1 % IJ SOLN
INTRAMUSCULAR | Status: AC
  Filled 2012-01-24: qty 30

## 2012-01-24 MED ORDER — METFORMIN HCL ER 500 MG PO TB24: 500.0000 mg | ORAL_TABLET | Freq: Every day | ORAL | Status: DC

## 2012-01-24 MED ORDER — NITROGLYCERIN 0.4 MG SL SUBL: 0.4000 mg | SUBLINGUAL_TABLET | SUBLINGUAL | Status: DC | PRN

## 2012-01-24 MED ORDER — NITROGLYCERIN 0.2 MG/ML ON CALL CATH LAB
INTRAVENOUS | Status: AC
  Filled 2012-01-24: qty 1

## 2012-01-24 MED ORDER — HEPARIN (PORCINE) IN NACL 2-0.9 UNIT/ML-% IJ SOLN
INTRAMUSCULAR | Status: AC
  Filled 2012-01-24: qty 1000

## 2012-01-24 NOTE — Discharge Summary (Signed)
Physician Discharge Summary  Patient ID: Richard [name] MRN: [id number] DOB/AGE: 13-Jun-1933 78 y.o.  Admit date: 01/21/2012 Discharge date: 01/24/2012  Primary Physician: Dr. Fara Chute  Primary Discharge Diagnosis: 1. Prolonged chest pain-possible unstable angina pectoris  Secondary Discharge Diagnosis:  2. Coronary artery disease with total occlusion of the right coronary arteries collaterals, mild/moderate proximal left main coronary artery disease treated medically 3. Hypertensive heart disease 4. Esophageal reflux 5. Obesity 6. Hyperlipidemia  Procedures:  Cardiac catheterization  Hospital Course: This 78 year old male has a history of an inferior infarction in 1995. He was treated medically at the time and he is subsequently had cardiac catheterization showing an occluded right coronary artery and a 50% diagonal stenosis. He has normally been asymptomatic without significant angina. Today he was sitting and had the onset of excessive belching associated with left arm discomfort which involved his entire left arm similar to his previous symptoms of his myocardial infarction except that it was his left arm instead of the right. The discomfort intensified. It was not associated with sweating or shortness of breath and not associated with mid sternal chest discomfort. He was taken to the [facility] emergency room where he was given aspirin and 2 nitroglycerin with ultimate relief after about 2 hours of pain. He was transferred to [facility] for further evaluation. He is currently pain-free. He has noted some mild dyspnea with exertion. He normally has no PND, orthopnea, syncope, or claudication. He does not normally have any significant angina.  The patient was transferred from the [facility] emergency room. Initial enzymes were negative. EKG did not show any ischemic ST changes. He was placed on intravenous heparin and remained pain-free throughout the weekend. He was taken to  the cardiac catheterization laboratory on the 18th and had findings of a 30-50% proximal left main stenosis that had progressed somewhat since his previous catheterization. The right coronary was occluded. There was minimal disease in the LAD and the circumflex. It was opted to place him on Plavix and continue medical treatment at this time. The left main is a very large caliber vessel distally. He is to resume his metformin in 2 days.   Discharge Exam: Blood pressure 110/61, pulse 54, temperature 98.3 F (36.8 C), temperature source Oral, resp. rate 16, height 5\' 8"  (1.727 m), weight 95.981 kg (211 lb 9.6 oz), SpO2 96.00%.   Catheterization site has a moderate size ecchymoses noted  Labs: CBC:   Lab Results  Component Value Date   WBC 7.4 01/24/2012   HGB 14.5 01/24/2012   HCT 44.1 01/24/2012   MCV 95.0 01/24/2012   PLT 128* 01/24/2012   CMP:  Lab 01/21/12 2028  NA 138  K 3.8  CL 103  CO2 25  BUN 15  CREATININE 0.80  CALCIUM 9.0  PROT 6.7  BILITOT 0.7  ALKPHOS 45  ALT 23  AST 21  GLUCOSE 150*   Lipid Panel     Component Value Date/Time   CHOL 141 01/22/2012 0515   TRIG 122 01/22/2012 0515   HDL 36* 01/22/2012 0515   CHOLHDL 3.9 01/22/2012 0515   VLDL 24 01/22/2012 0515   LDLCALC 81 01/22/2012 0515   Cardiac Enzymes:  Basename 01/22/12 0515 01/22/12 0010 01/21/12 2030  CKTOTAL -- -- --  CKMB -- -- --  CKMBINDEX -- -- --  TROPONINI <0.30 <0.30 <0.30   EKG: Normal EKG without ischemic changes  Discharge Medications:  [place], [place]  Home Medication Instructions FAO:[id number]   Printed on:01/24/12 1745  Medication Information                    metFORMIN (GLUCOPHAGE-XR) 500 MG 24 hr tablet Take 500 mg by mouth daily with breakfast.           atenolol (TENORMIN) 25 MG tablet Take 12.5 mg by mouth daily.           atorvastatin (LIPITOR) 10 MG tablet Take 10 mg by mouth every evening.           valsartan (DIOVAN) 160 MG tablet Take 160 mg by  mouth daily.           aspirin EC 81 MG tablet Take 81 mg by mouth daily.           naproxen sodium (ANAPROX) 220 MG tablet Take 440 mg by mouth daily as needed. For pain           nitroGLYCERIN (NITROSTAT) 0.4 MG SL tablet Place 1 tablet (0.4 mg total) under the tongue every 5 (five) minutes x 3 doses as needed for chest pain.           clopidogrel (PLAVIX) 75 MG tablet Take 1 tablet (75 mg total) by mouth daily with breakfast.             Followup plans and appointments: Followup in one week. Advance activity slowly. Call if problems with catheterization site.    Time spent with patient to include physician time. 30 minutes  Signed: Darden Palmer. MD [facility] 01/24/2012, 5:45 PM

## 2012-01-24 NOTE — H&P (View-Only) (Signed)
Subjective:  No recurrent arm pain overnight.  Enzymes negative. No arrhythmias.  Objective:  Vital Signs in the last 24 hours: BP 129/79  Pulse 55  Temp 97.8 F (36.6 C) (Oral)  Resp 17  Ht 5' 8" (1.727 m)  Wt 95.981 kg (211 lb 9.6 oz)  BMI 32.17 kg/m2  SpO2 95%  Physical Exam: Pleasant WM in NAD Lungs:  Clear  Cardiac:  Regular rhythm, normal S1 and S2, no S3 Extremities:  No edema present  Intake/Output from previous day: 11/16 0701 - 11/17 0700 In: 1775.7 [P.O.:1440; I.V.:335.7] Out: 2900 [Urine:2900]  Weight Filed Weights   01/21/12 1742 01/23/12 0546  Weight: 96.4 kg (212 lb 8.4 oz) 95.981 kg (211 lb 9.6 oz)    Lab Results: Basic Metabolic Panel:  Basename 01/21/12 2028  NA 138  K 3.8  CL 103  CO2 25  GLUCOSE 150*  BUN 15  CREATININE 0.80   CBC:  Basename 01/23/12 0540 01/22/12 0515  WBC 7.5 7.0  NEUTROABS -- --  HGB 14.7 13.9  HCT 44.3 41.9  MCV 94.3 93.9  PLT 127* 129*   Cardiac Enzymes:  Basename 01/22/12 0515 01/22/12 0010 01/21/12 2030  CKTOTAL -- -- --  CKMB -- -- --  CKMBINDEX -- -- --  TROPONINI <0.30 <0.30 <0.30    Telemetry: Sinus bradycardia  Assessment/Plan: 1. Unstable angina 2. CAD 3. Diabetes  Rec:  Stable on IV heparin, cath in am. Cardiac catheterization was discussed with the patient fully including risks of myocardial infarction, death, stroke, bleeding, arrhythmia, dye allergy, renal insufficiency or bleeding.  The patient understands and is willing to proceed.    W. Spencer Tilley, Jr.  MD FACC Cardiology  01/23/2012, 11:57 AM    

## 2012-01-24 NOTE — Interval H&P Note (Signed)
History and Physical Interval Note:  01/24/2012 7:34 AM    Patient seen and examined.  No interval change in history and exam since last note.  Stable for procedure.  Richard [name]. MD [facility]  01/24/2012

## 2012-01-24 NOTE — Op Note (Signed)
Cardiac Catheterization Report   Richard [name]    78 y.o.  male  DOB: 1933/04/06  MRN: [id number]  01/24/2012   PROCEDURE:  Left heart catheterization with selective coronary angiography, left ventriculogram.  INDICATIONS: Unstable angina pectoris. The risks, benefits, and details of the procedure were explained to the patient.  The patient verbalized understanding and wanted to proceed.  Informed written consent was obtained.  PROCEDURE TECHNIQUE:  After Xylocaine anesthesia a 64F sheath was placed in the right femoral artery with a single anterior needle wall stick.   Left coronary angiography was done using a Judkins L4 guide catheter.  Right coronary angiography was done using a Judkins R4 guide catheter.  A 30 cc ventriculogram was performed in the 30 degree RAO projection.  Tolerated the procedure well.  Sheath removed in the holding area.   CONTRAST:  Total of 75 cc.  COMPLICATIONS:  None.    HEMODYNAMICS:  Aortic postcontrast 119/70, LV postcontrast 119/6-12.  There was no gradient between the left ventricle and aorta.    ANGIOGRAPHIC DATA:    CORONARY ARTERIES:   Arise and distribute normally.  Right dominant. Mild coronary calcification is noted.  Left main coronary artery: Proximal 40% narrowing but the vessel is quite large distally  Left anterior descending: Mild irregularities throughout the vessel, has a moderate sized diagonal branch  Circumflex coronary artery: Scattered irregularities.  Right coronary artery: Occluded proximally. The distal vessel fills 3 well-developed system of collaterals from the left coronary system.  LEFT VENTRICULOGRAM:  Performed in the 30 RAO projection.  The aortic and mitral valves are normal. The left ventricle is normal in size with normal wall motion. Estimated ejection fraction is 60%.  IMPRESSIONS:  1. Mild to moderate proximal left main coronary artery stenosis, occluded right coronary  artery with good collaterals and scattered irregularities in the LAD and circumflex.  2. Normal left ventricle.  RECOMMENDATION:  Continued medical treatment at this time.   Darden Palmer MD [facility]

## 2012-02-01 DIAGNOSIS — E1159 Type 2 diabetes mellitus with other circulatory complications: Secondary | ICD-10-CM | POA: Diagnosis not present

## 2012-02-01 DIAGNOSIS — E669 Obesity, unspecified: Secondary | ICD-10-CM | POA: Diagnosis not present

## 2012-02-01 DIAGNOSIS — I252 Old myocardial infarction: Secondary | ICD-10-CM | POA: Diagnosis not present

## 2012-02-01 DIAGNOSIS — I10 Essential (primary) hypertension: Secondary | ICD-10-CM | POA: Diagnosis not present

## 2012-02-01 DIAGNOSIS — R5381 Other malaise: Secondary | ICD-10-CM | POA: Diagnosis not present

## 2012-02-01 DIAGNOSIS — E785 Hyperlipidemia, unspecified: Secondary | ICD-10-CM | POA: Diagnosis not present

## 2012-02-01 DIAGNOSIS — I2510 Atherosclerotic heart disease of native coronary artery without angina pectoris: Secondary | ICD-10-CM | POA: Diagnosis not present

## 2012-02-01 DIAGNOSIS — R5383 Other fatigue: Secondary | ICD-10-CM | POA: Diagnosis not present

## 2012-02-01 DIAGNOSIS — K219 Gastro-esophageal reflux disease without esophagitis: Secondary | ICD-10-CM | POA: Diagnosis not present

## 2012-02-15 DIAGNOSIS — E669 Obesity, unspecified: Secondary | ICD-10-CM | POA: Diagnosis not present

## 2012-02-15 DIAGNOSIS — K219 Gastro-esophageal reflux disease without esophagitis: Secondary | ICD-10-CM | POA: Diagnosis not present

## 2012-02-15 DIAGNOSIS — E785 Hyperlipidemia, unspecified: Secondary | ICD-10-CM | POA: Diagnosis not present

## 2012-02-15 DIAGNOSIS — I252 Old myocardial infarction: Secondary | ICD-10-CM | POA: Diagnosis not present

## 2012-02-15 DIAGNOSIS — E1159 Type 2 diabetes mellitus with other circulatory complications: Secondary | ICD-10-CM | POA: Diagnosis not present

## 2012-02-15 DIAGNOSIS — I10 Essential (primary) hypertension: Secondary | ICD-10-CM | POA: Diagnosis not present

## 2012-02-15 DIAGNOSIS — I2510 Atherosclerotic heart disease of native coronary artery without angina pectoris: Secondary | ICD-10-CM | POA: Diagnosis not present

## 2012-03-09 DIAGNOSIS — E119 Type 2 diabetes mellitus without complications: Secondary | ICD-10-CM | POA: Diagnosis not present

## 2012-03-16 DIAGNOSIS — I10 Essential (primary) hypertension: Secondary | ICD-10-CM | POA: Diagnosis not present

## 2012-03-16 DIAGNOSIS — E7800 Pure hypercholesterolemia, unspecified: Secondary | ICD-10-CM | POA: Diagnosis not present

## 2012-03-16 DIAGNOSIS — E119 Type 2 diabetes mellitus without complications: Secondary | ICD-10-CM | POA: Diagnosis not present

## 2012-03-16 DIAGNOSIS — N400 Enlarged prostate without lower urinary tract symptoms: Secondary | ICD-10-CM | POA: Diagnosis not present

## 2012-03-21 DIAGNOSIS — M1990 Unspecified osteoarthritis, unspecified site: Secondary | ICD-10-CM | POA: Diagnosis not present

## 2012-03-21 DIAGNOSIS — E669 Obesity, unspecified: Secondary | ICD-10-CM | POA: Diagnosis not present

## 2012-03-21 DIAGNOSIS — I2510 Atherosclerotic heart disease of native coronary artery without angina pectoris: Secondary | ICD-10-CM | POA: Diagnosis not present

## 2012-03-21 DIAGNOSIS — E7800 Pure hypercholesterolemia, unspecified: Secondary | ICD-10-CM | POA: Diagnosis not present

## 2012-03-21 DIAGNOSIS — I10 Essential (primary) hypertension: Secondary | ICD-10-CM | POA: Diagnosis not present

## 2012-03-21 DIAGNOSIS — E119 Type 2 diabetes mellitus without complications: Secondary | ICD-10-CM | POA: Diagnosis not present

## 2012-03-22 DIAGNOSIS — Z85828 Personal history of other malignant neoplasm of skin: Secondary | ICD-10-CM | POA: Diagnosis not present

## 2012-04-13 DIAGNOSIS — H402290 Chronic angle-closure glaucoma, unspecified eye, stage unspecified: Secondary | ICD-10-CM | POA: Diagnosis not present

## 2012-04-13 DIAGNOSIS — H2510 Age-related nuclear cataract, unspecified eye: Secondary | ICD-10-CM | POA: Diagnosis not present

## 2012-04-13 DIAGNOSIS — E109 Type 1 diabetes mellitus without complications: Secondary | ICD-10-CM | POA: Diagnosis not present

## 2012-06-09 DIAGNOSIS — H2510 Age-related nuclear cataract, unspecified eye: Secondary | ICD-10-CM | POA: Diagnosis not present

## 2012-06-09 DIAGNOSIS — H269 Unspecified cataract: Secondary | ICD-10-CM | POA: Diagnosis not present

## 2012-07-12 DIAGNOSIS — E7800 Pure hypercholesterolemia, unspecified: Secondary | ICD-10-CM | POA: Diagnosis not present

## 2012-07-12 DIAGNOSIS — E119 Type 2 diabetes mellitus without complications: Secondary | ICD-10-CM | POA: Diagnosis not present

## 2012-07-12 DIAGNOSIS — I10 Essential (primary) hypertension: Secondary | ICD-10-CM | POA: Diagnosis not present

## 2012-07-12 DIAGNOSIS — I2510 Atherosclerotic heart disease of native coronary artery without angina pectoris: Secondary | ICD-10-CM | POA: Diagnosis not present

## 2012-07-12 DIAGNOSIS — M1990 Unspecified osteoarthritis, unspecified site: Secondary | ICD-10-CM | POA: Diagnosis not present

## 2012-07-12 DIAGNOSIS — E669 Obesity, unspecified: Secondary | ICD-10-CM | POA: Diagnosis not present

## 2012-07-19 DIAGNOSIS — E7800 Pure hypercholesterolemia, unspecified: Secondary | ICD-10-CM | POA: Diagnosis not present

## 2012-07-19 DIAGNOSIS — M1990 Unspecified osteoarthritis, unspecified site: Secondary | ICD-10-CM | POA: Diagnosis not present

## 2012-07-19 DIAGNOSIS — E119 Type 2 diabetes mellitus without complications: Secondary | ICD-10-CM | POA: Diagnosis not present

## 2012-07-19 DIAGNOSIS — I2510 Atherosclerotic heart disease of native coronary artery without angina pectoris: Secondary | ICD-10-CM | POA: Diagnosis not present

## 2012-07-19 DIAGNOSIS — I10 Essential (primary) hypertension: Secondary | ICD-10-CM | POA: Diagnosis not present

## 2012-07-19 DIAGNOSIS — E669 Obesity, unspecified: Secondary | ICD-10-CM | POA: Diagnosis not present

## 2012-07-25 DIAGNOSIS — R21 Rash and other nonspecific skin eruption: Secondary | ICD-10-CM | POA: Diagnosis not present

## 2012-07-25 DIAGNOSIS — R42 Dizziness and giddiness: Secondary | ICD-10-CM | POA: Diagnosis not present

## 2012-08-23 DIAGNOSIS — H2510 Age-related nuclear cataract, unspecified eye: Secondary | ICD-10-CM | POA: Diagnosis not present

## 2012-08-25 DIAGNOSIS — H2510 Age-related nuclear cataract, unspecified eye: Secondary | ICD-10-CM | POA: Diagnosis not present

## 2012-08-25 DIAGNOSIS — H269 Unspecified cataract: Secondary | ICD-10-CM | POA: Diagnosis not present

## 2012-11-07 DIAGNOSIS — Z85828 Personal history of other malignant neoplasm of skin: Secondary | ICD-10-CM | POA: Diagnosis not present

## 2012-11-07 DIAGNOSIS — L570 Actinic keratosis: Secondary | ICD-10-CM | POA: Diagnosis not present

## 2012-11-15 DIAGNOSIS — I10 Essential (primary) hypertension: Secondary | ICD-10-CM | POA: Diagnosis not present

## 2012-11-15 DIAGNOSIS — E7800 Pure hypercholesterolemia, unspecified: Secondary | ICD-10-CM | POA: Diagnosis not present

## 2012-11-15 DIAGNOSIS — E119 Type 2 diabetes mellitus without complications: Secondary | ICD-10-CM | POA: Diagnosis not present

## 2012-11-22 DIAGNOSIS — M1990 Unspecified osteoarthritis, unspecified site: Secondary | ICD-10-CM | POA: Diagnosis not present

## 2012-11-22 DIAGNOSIS — E669 Obesity, unspecified: Secondary | ICD-10-CM | POA: Diagnosis not present

## 2012-11-22 DIAGNOSIS — Z23 Encounter for immunization: Secondary | ICD-10-CM | POA: Diagnosis not present

## 2012-11-22 DIAGNOSIS — I10 Essential (primary) hypertension: Secondary | ICD-10-CM | POA: Diagnosis not present

## 2012-11-22 DIAGNOSIS — E7800 Pure hypercholesterolemia, unspecified: Secondary | ICD-10-CM | POA: Diagnosis not present

## 2012-11-22 DIAGNOSIS — E119 Type 2 diabetes mellitus without complications: Secondary | ICD-10-CM | POA: Diagnosis not present

## 2012-11-22 DIAGNOSIS — I2510 Atherosclerotic heart disease of native coronary artery without angina pectoris: Secondary | ICD-10-CM | POA: Diagnosis not present

## 2013-02-13 DIAGNOSIS — I2510 Atherosclerotic heart disease of native coronary artery without angina pectoris: Secondary | ICD-10-CM | POA: Diagnosis not present

## 2013-02-13 DIAGNOSIS — K219 Gastro-esophageal reflux disease without esophagitis: Secondary | ICD-10-CM | POA: Diagnosis not present

## 2013-02-13 DIAGNOSIS — I10 Essential (primary) hypertension: Secondary | ICD-10-CM | POA: Diagnosis not present

## 2013-02-13 DIAGNOSIS — E1159 Type 2 diabetes mellitus with other circulatory complications: Secondary | ICD-10-CM | POA: Diagnosis not present

## 2013-02-13 DIAGNOSIS — E785 Hyperlipidemia, unspecified: Secondary | ICD-10-CM | POA: Diagnosis not present

## 2013-02-13 DIAGNOSIS — I252 Old myocardial infarction: Secondary | ICD-10-CM | POA: Diagnosis not present

## 2013-02-13 DIAGNOSIS — E669 Obesity, unspecified: Secondary | ICD-10-CM | POA: Diagnosis not present

## 2013-03-09 DIAGNOSIS — H6690 Otitis media, unspecified, unspecified ear: Secondary | ICD-10-CM | POA: Diagnosis not present

## 2013-03-09 DIAGNOSIS — H833X9 Noise effects on inner ear, unspecified ear: Secondary | ICD-10-CM | POA: Diagnosis not present

## 2013-03-15 DIAGNOSIS — J111 Influenza due to unidentified influenza virus with other respiratory manifestations: Secondary | ICD-10-CM | POA: Diagnosis not present

## 2013-03-15 DIAGNOSIS — R1110 Vomiting, unspecified: Secondary | ICD-10-CM | POA: Diagnosis not present

## 2013-03-19 DIAGNOSIS — E119 Type 2 diabetes mellitus without complications: Secondary | ICD-10-CM | POA: Diagnosis not present

## 2013-03-19 DIAGNOSIS — K2100 Gastro-esophageal reflux disease with esophagitis, without bleeding: Secondary | ICD-10-CM | POA: Diagnosis not present

## 2013-03-19 DIAGNOSIS — I10 Essential (primary) hypertension: Secondary | ICD-10-CM | POA: Diagnosis not present

## 2013-03-19 DIAGNOSIS — E7800 Pure hypercholesterolemia, unspecified: Secondary | ICD-10-CM | POA: Diagnosis not present

## 2013-03-23 DIAGNOSIS — H6690 Otitis media, unspecified, unspecified ear: Secondary | ICD-10-CM | POA: Diagnosis not present

## 2013-03-23 DIAGNOSIS — H903 Sensorineural hearing loss, bilateral: Secondary | ICD-10-CM | POA: Diagnosis not present

## 2013-03-26 DIAGNOSIS — IMO0001 Reserved for inherently not codable concepts without codable children: Secondary | ICD-10-CM | POA: Diagnosis not present

## 2013-03-26 DIAGNOSIS — I10 Essential (primary) hypertension: Secondary | ICD-10-CM | POA: Diagnosis not present

## 2013-03-26 DIAGNOSIS — M1990 Unspecified osteoarthritis, unspecified site: Secondary | ICD-10-CM | POA: Diagnosis not present

## 2013-03-26 DIAGNOSIS — E7800 Pure hypercholesterolemia, unspecified: Secondary | ICD-10-CM | POA: Diagnosis not present

## 2013-03-26 DIAGNOSIS — Z23 Encounter for immunization: Secondary | ICD-10-CM | POA: Diagnosis not present

## 2013-03-26 DIAGNOSIS — E669 Obesity, unspecified: Secondary | ICD-10-CM | POA: Diagnosis not present

## 2013-03-26 DIAGNOSIS — I2510 Atherosclerotic heart disease of native coronary artery without angina pectoris: Secondary | ICD-10-CM | POA: Diagnosis not present

## 2013-04-08 DIAGNOSIS — I059 Rheumatic mitral valve disease, unspecified: Secondary | ICD-10-CM | POA: Diagnosis not present

## 2013-04-08 DIAGNOSIS — R079 Chest pain, unspecified: Secondary | ICD-10-CM | POA: Diagnosis not present

## 2013-04-08 DIAGNOSIS — Z79899 Other long term (current) drug therapy: Secondary | ICD-10-CM | POA: Diagnosis not present

## 2013-04-08 DIAGNOSIS — E7800 Pure hypercholesterolemia, unspecified: Secondary | ICD-10-CM | POA: Diagnosis present

## 2013-04-08 DIAGNOSIS — Z7982 Long term (current) use of aspirin: Secondary | ICD-10-CM | POA: Diagnosis not present

## 2013-04-08 DIAGNOSIS — Z7902 Long term (current) use of antithrombotics/antiplatelets: Secondary | ICD-10-CM | POA: Diagnosis not present

## 2013-04-08 DIAGNOSIS — I10 Essential (primary) hypertension: Secondary | ICD-10-CM | POA: Diagnosis not present

## 2013-04-08 DIAGNOSIS — I2699 Other pulmonary embolism without acute cor pulmonale: Secondary | ICD-10-CM | POA: Diagnosis not present

## 2013-04-08 DIAGNOSIS — I4891 Unspecified atrial fibrillation: Secondary | ICD-10-CM | POA: Diagnosis not present

## 2013-04-08 DIAGNOSIS — I2510 Atherosclerotic heart disease of native coronary artery without angina pectoris: Secondary | ICD-10-CM | POA: Diagnosis not present

## 2013-04-08 DIAGNOSIS — I252 Old myocardial infarction: Secondary | ICD-10-CM | POA: Diagnosis not present

## 2013-04-08 DIAGNOSIS — Z87891 Personal history of nicotine dependence: Secondary | ICD-10-CM | POA: Diagnosis not present

## 2013-04-08 DIAGNOSIS — I369 Nonrheumatic tricuspid valve disorder, unspecified: Secondary | ICD-10-CM | POA: Diagnosis not present

## 2013-04-08 DIAGNOSIS — E785 Hyperlipidemia, unspecified: Secondary | ICD-10-CM | POA: Diagnosis present

## 2013-04-08 DIAGNOSIS — E119 Type 2 diabetes mellitus without complications: Secondary | ICD-10-CM | POA: Diagnosis not present

## 2013-04-08 DIAGNOSIS — I359 Nonrheumatic aortic valve disorder, unspecified: Secondary | ICD-10-CM | POA: Diagnosis not present

## 2013-05-09 DIAGNOSIS — R0602 Shortness of breath: Secondary | ICD-10-CM | POA: Diagnosis not present

## 2013-05-09 DIAGNOSIS — I4891 Unspecified atrial fibrillation: Secondary | ICD-10-CM | POA: Diagnosis not present

## 2013-05-09 DIAGNOSIS — R079 Chest pain, unspecified: Secondary | ICD-10-CM | POA: Diagnosis not present

## 2013-05-12 DIAGNOSIS — I2699 Other pulmonary embolism without acute cor pulmonale: Secondary | ICD-10-CM | POA: Diagnosis not present

## 2013-05-12 DIAGNOSIS — E7800 Pure hypercholesterolemia, unspecified: Secondary | ICD-10-CM | POA: Diagnosis not present

## 2013-05-12 DIAGNOSIS — E119 Type 2 diabetes mellitus without complications: Secondary | ICD-10-CM | POA: Diagnosis not present

## 2013-05-12 DIAGNOSIS — I4891 Unspecified atrial fibrillation: Secondary | ICD-10-CM | POA: Diagnosis not present

## 2013-05-12 DIAGNOSIS — I10 Essential (primary) hypertension: Secondary | ICD-10-CM | POA: Diagnosis not present

## 2013-05-14 DIAGNOSIS — E1139 Type 2 diabetes mellitus with other diabetic ophthalmic complication: Secondary | ICD-10-CM | POA: Diagnosis not present

## 2013-06-26 DIAGNOSIS — S60229A Contusion of unspecified hand, initial encounter: Secondary | ICD-10-CM | POA: Diagnosis not present

## 2013-06-26 DIAGNOSIS — S20219A Contusion of unspecified front wall of thorax, initial encounter: Secondary | ICD-10-CM | POA: Diagnosis not present

## 2013-07-04 DIAGNOSIS — I4891 Unspecified atrial fibrillation: Secondary | ICD-10-CM | POA: Diagnosis not present

## 2013-07-04 DIAGNOSIS — R079 Chest pain, unspecified: Secondary | ICD-10-CM | POA: Diagnosis not present

## 2013-07-09 DIAGNOSIS — I4891 Unspecified atrial fibrillation: Secondary | ICD-10-CM | POA: Diagnosis not present

## 2013-07-09 DIAGNOSIS — R0602 Shortness of breath: Secondary | ICD-10-CM | POA: Diagnosis not present

## 2013-07-24 DIAGNOSIS — M546 Pain in thoracic spine: Secondary | ICD-10-CM | POA: Diagnosis not present

## 2013-07-24 DIAGNOSIS — M5430 Sciatica, unspecified side: Secondary | ICD-10-CM | POA: Diagnosis not present

## 2013-07-24 DIAGNOSIS — M9981 Other biomechanical lesions of cervical region: Secondary | ICD-10-CM | POA: Diagnosis not present

## 2013-07-24 DIAGNOSIS — M999 Biomechanical lesion, unspecified: Secondary | ICD-10-CM | POA: Diagnosis not present

## 2013-07-24 DIAGNOSIS — M4712 Other spondylosis with myelopathy, cervical region: Secondary | ICD-10-CM | POA: Diagnosis not present

## 2013-07-27 DIAGNOSIS — I4891 Unspecified atrial fibrillation: Secondary | ICD-10-CM | POA: Diagnosis not present

## 2013-07-27 DIAGNOSIS — I259 Chronic ischemic heart disease, unspecified: Secondary | ICD-10-CM | POA: Diagnosis not present

## 2013-07-27 DIAGNOSIS — I10 Essential (primary) hypertension: Secondary | ICD-10-CM | POA: Diagnosis not present

## 2013-07-27 DIAGNOSIS — E669 Obesity, unspecified: Secondary | ICD-10-CM | POA: Diagnosis not present

## 2013-07-27 DIAGNOSIS — M1990 Unspecified osteoarthritis, unspecified site: Secondary | ICD-10-CM | POA: Diagnosis not present

## 2013-07-27 DIAGNOSIS — E7800 Pure hypercholesterolemia, unspecified: Secondary | ICD-10-CM | POA: Diagnosis not present

## 2013-07-27 DIAGNOSIS — E119 Type 2 diabetes mellitus without complications: Secondary | ICD-10-CM | POA: Diagnosis not present

## 2013-07-28 DIAGNOSIS — M545 Low back pain, unspecified: Secondary | ICD-10-CM | POA: Diagnosis not present

## 2013-08-07 DIAGNOSIS — I2510 Atherosclerotic heart disease of native coronary artery without angina pectoris: Secondary | ICD-10-CM | POA: Diagnosis not present

## 2013-08-07 DIAGNOSIS — I10 Essential (primary) hypertension: Secondary | ICD-10-CM | POA: Diagnosis not present

## 2013-08-07 DIAGNOSIS — M545 Low back pain, unspecified: Secondary | ICD-10-CM | POA: Diagnosis not present

## 2013-08-07 DIAGNOSIS — IMO0001 Reserved for inherently not codable concepts without codable children: Secondary | ICD-10-CM | POA: Diagnosis not present

## 2013-08-07 DIAGNOSIS — M1990 Unspecified osteoarthritis, unspecified site: Secondary | ICD-10-CM | POA: Diagnosis not present

## 2013-08-07 DIAGNOSIS — E669 Obesity, unspecified: Secondary | ICD-10-CM | POA: Diagnosis not present

## 2013-08-07 DIAGNOSIS — E7800 Pure hypercholesterolemia, unspecified: Secondary | ICD-10-CM | POA: Diagnosis not present

## 2013-09-05 DIAGNOSIS — M545 Low back pain, unspecified: Secondary | ICD-10-CM | POA: Diagnosis not present

## 2013-09-25 DIAGNOSIS — C44319 Basal cell carcinoma of skin of other parts of face: Secondary | ICD-10-CM | POA: Diagnosis not present

## 2013-11-06 DIAGNOSIS — L570 Actinic keratosis: Secondary | ICD-10-CM | POA: Diagnosis not present

## 2013-11-06 DIAGNOSIS — Z85828 Personal history of other malignant neoplasm of skin: Secondary | ICD-10-CM | POA: Diagnosis not present

## 2013-12-04 DIAGNOSIS — I4891 Unspecified atrial fibrillation: Secondary | ICD-10-CM | POA: Diagnosis not present

## 2013-12-04 DIAGNOSIS — R0602 Shortness of breath: Secondary | ICD-10-CM | POA: Diagnosis not present

## 2013-12-13 DIAGNOSIS — Z23 Encounter for immunization: Secondary | ICD-10-CM | POA: Diagnosis not present

## 2014-01-09 DIAGNOSIS — L570 Actinic keratosis: Secondary | ICD-10-CM | POA: Diagnosis not present

## 2014-01-09 DIAGNOSIS — H02824 Cysts of left upper eyelid: Secondary | ICD-10-CM | POA: Diagnosis not present

## 2014-01-09 DIAGNOSIS — Z08 Encounter for follow-up examination after completed treatment for malignant neoplasm: Secondary | ICD-10-CM | POA: Diagnosis not present

## 2014-01-30 DIAGNOSIS — E1165 Type 2 diabetes mellitus with hyperglycemia: Secondary | ICD-10-CM | POA: Diagnosis not present

## 2014-01-30 DIAGNOSIS — K210 Gastro-esophageal reflux disease with esophagitis: Secondary | ICD-10-CM | POA: Diagnosis not present

## 2014-01-30 DIAGNOSIS — E780 Pure hypercholesterolemia: Secondary | ICD-10-CM | POA: Diagnosis not present

## 2014-01-30 DIAGNOSIS — I10 Essential (primary) hypertension: Secondary | ICD-10-CM | POA: Diagnosis not present

## 2014-02-06 DIAGNOSIS — I10 Essential (primary) hypertension: Secondary | ICD-10-CM | POA: Diagnosis not present

## 2014-02-06 DIAGNOSIS — I482 Chronic atrial fibrillation: Secondary | ICD-10-CM | POA: Diagnosis not present

## 2014-02-06 DIAGNOSIS — Z1389 Encounter for screening for other disorder: Secondary | ICD-10-CM | POA: Diagnosis not present

## 2014-02-06 DIAGNOSIS — E1165 Type 2 diabetes mellitus with hyperglycemia: Secondary | ICD-10-CM | POA: Diagnosis not present

## 2014-02-06 DIAGNOSIS — E780 Pure hypercholesterolemia: Secondary | ICD-10-CM | POA: Diagnosis not present

## 2014-02-06 DIAGNOSIS — I259 Chronic ischemic heart disease, unspecified: Secondary | ICD-10-CM | POA: Diagnosis not present

## 2014-02-14 ENCOUNTER — Encounter (HOSPITAL_COMMUNITY): Payer: Self-pay | Admitting: Cardiology

## 2014-02-20 DIAGNOSIS — M25531 Pain in right wrist: Secondary | ICD-10-CM | POA: Diagnosis not present

## 2014-02-20 DIAGNOSIS — G5601 Carpal tunnel syndrome, right upper limb: Secondary | ICD-10-CM | POA: Diagnosis not present

## 2014-04-23 DIAGNOSIS — R918 Other nonspecific abnormal finding of lung field: Secondary | ICD-10-CM | POA: Diagnosis not present

## 2014-04-23 DIAGNOSIS — R0602 Shortness of breath: Secondary | ICD-10-CM | POA: Diagnosis not present

## 2014-04-23 DIAGNOSIS — I4891 Unspecified atrial fibrillation: Secondary | ICD-10-CM | POA: Diagnosis not present

## 2014-04-30 DIAGNOSIS — E669 Obesity, unspecified: Secondary | ICD-10-CM | POA: Diagnosis not present

## 2014-04-30 DIAGNOSIS — E119 Type 2 diabetes mellitus without complications: Secondary | ICD-10-CM | POA: Diagnosis not present

## 2014-04-30 DIAGNOSIS — R0602 Shortness of breath: Secondary | ICD-10-CM | POA: Diagnosis not present

## 2014-04-30 DIAGNOSIS — I4891 Unspecified atrial fibrillation: Secondary | ICD-10-CM | POA: Diagnosis not present

## 2014-04-30 DIAGNOSIS — Z6833 Body mass index (BMI) 33.0-33.9, adult: Secondary | ICD-10-CM | POA: Diagnosis not present

## 2014-04-30 DIAGNOSIS — I252 Old myocardial infarction: Secondary | ICD-10-CM | POA: Diagnosis not present

## 2014-04-30 DIAGNOSIS — I10 Essential (primary) hypertension: Secondary | ICD-10-CM | POA: Diagnosis not present

## 2014-04-30 DIAGNOSIS — I2510 Atherosclerotic heart disease of native coronary artery without angina pectoris: Secondary | ICD-10-CM | POA: Diagnosis not present

## 2014-04-30 DIAGNOSIS — R0789 Other chest pain: Secondary | ICD-10-CM | POA: Diagnosis not present

## 2014-04-30 DIAGNOSIS — Z79899 Other long term (current) drug therapy: Secondary | ICD-10-CM | POA: Diagnosis not present

## 2014-04-30 DIAGNOSIS — Z7982 Long term (current) use of aspirin: Secondary | ICD-10-CM | POA: Diagnosis not present

## 2014-04-30 DIAGNOSIS — Z86711 Personal history of pulmonary embolism: Secondary | ICD-10-CM | POA: Diagnosis not present

## 2014-05-07 DIAGNOSIS — Z9889 Other specified postprocedural states: Secondary | ICD-10-CM | POA: Diagnosis not present

## 2014-05-07 DIAGNOSIS — I4891 Unspecified atrial fibrillation: Secondary | ICD-10-CM | POA: Diagnosis not present

## 2014-05-30 DIAGNOSIS — K210 Gastro-esophageal reflux disease with esophagitis: Secondary | ICD-10-CM | POA: Diagnosis not present

## 2014-05-30 DIAGNOSIS — E780 Pure hypercholesterolemia: Secondary | ICD-10-CM | POA: Diagnosis not present

## 2014-05-30 DIAGNOSIS — I482 Chronic atrial fibrillation: Secondary | ICD-10-CM | POA: Diagnosis not present

## 2014-05-30 DIAGNOSIS — I10 Essential (primary) hypertension: Secondary | ICD-10-CM | POA: Diagnosis not present

## 2014-05-30 DIAGNOSIS — E1165 Type 2 diabetes mellitus with hyperglycemia: Secondary | ICD-10-CM | POA: Diagnosis not present

## 2014-06-07 DIAGNOSIS — I482 Chronic atrial fibrillation: Secondary | ICD-10-CM | POA: Diagnosis not present

## 2014-06-07 DIAGNOSIS — I10 Essential (primary) hypertension: Secondary | ICD-10-CM | POA: Diagnosis not present

## 2014-06-07 DIAGNOSIS — E1165 Type 2 diabetes mellitus with hyperglycemia: Secondary | ICD-10-CM | POA: Diagnosis not present

## 2014-06-18 DIAGNOSIS — I4891 Unspecified atrial fibrillation: Secondary | ICD-10-CM | POA: Diagnosis not present

## 2014-07-08 DIAGNOSIS — E119 Type 2 diabetes mellitus without complications: Secondary | ICD-10-CM | POA: Diagnosis not present

## 2014-07-08 DIAGNOSIS — Z961 Presence of intraocular lens: Secondary | ICD-10-CM | POA: Diagnosis not present

## 2014-07-10 DIAGNOSIS — Z08 Encounter for follow-up examination after completed treatment for malignant neoplasm: Secondary | ICD-10-CM | POA: Diagnosis not present

## 2014-07-10 DIAGNOSIS — Z85828 Personal history of other malignant neoplasm of skin: Secondary | ICD-10-CM | POA: Diagnosis not present

## 2014-07-10 DIAGNOSIS — L570 Actinic keratosis: Secondary | ICD-10-CM | POA: Diagnosis not present

## 2014-08-29 DIAGNOSIS — I259 Chronic ischemic heart disease, unspecified: Secondary | ICD-10-CM | POA: Diagnosis not present

## 2014-08-29 DIAGNOSIS — E1165 Type 2 diabetes mellitus with hyperglycemia: Secondary | ICD-10-CM | POA: Diagnosis not present

## 2014-08-29 DIAGNOSIS — K210 Gastro-esophageal reflux disease with esophagitis: Secondary | ICD-10-CM | POA: Diagnosis not present

## 2014-08-29 DIAGNOSIS — E780 Pure hypercholesterolemia: Secondary | ICD-10-CM | POA: Diagnosis not present

## 2014-09-13 DIAGNOSIS — E1165 Type 2 diabetes mellitus with hyperglycemia: Secondary | ICD-10-CM | POA: Diagnosis not present

## 2014-09-13 DIAGNOSIS — I10 Essential (primary) hypertension: Secondary | ICD-10-CM | POA: Diagnosis not present

## 2014-09-13 DIAGNOSIS — M7061 Trochanteric bursitis, right hip: Secondary | ICD-10-CM | POA: Diagnosis not present

## 2014-09-13 DIAGNOSIS — E780 Pure hypercholesterolemia: Secondary | ICD-10-CM | POA: Diagnosis not present

## 2014-09-23 DIAGNOSIS — I480 Paroxysmal atrial fibrillation: Secondary | ICD-10-CM | POA: Diagnosis not present

## 2014-09-23 DIAGNOSIS — T462X4S Poisoning by other antidysrhythmic drugs, undetermined, sequela: Secondary | ICD-10-CM | POA: Diagnosis not present

## 2014-09-23 DIAGNOSIS — R0602 Shortness of breath: Secondary | ICD-10-CM | POA: Diagnosis not present

## 2014-10-08 DIAGNOSIS — R0602 Shortness of breath: Secondary | ICD-10-CM | POA: Diagnosis not present

## 2014-10-08 DIAGNOSIS — I480 Paroxysmal atrial fibrillation: Secondary | ICD-10-CM | POA: Diagnosis not present

## 2014-10-08 DIAGNOSIS — T462X4S Poisoning by other antidysrhythmic drugs, undetermined, sequela: Secondary | ICD-10-CM | POA: Diagnosis not present

## 2014-11-27 DIAGNOSIS — M5441 Lumbago with sciatica, right side: Secondary | ICD-10-CM | POA: Diagnosis not present

## 2014-11-27 DIAGNOSIS — M25551 Pain in right hip: Secondary | ICD-10-CM | POA: Diagnosis not present

## 2014-11-28 ENCOUNTER — Other Ambulatory Visit: Payer: Self-pay | Admitting: Orthopaedic Surgery

## 2014-11-28 DIAGNOSIS — M545 Low back pain: Secondary | ICD-10-CM

## 2014-12-12 ENCOUNTER — Other Ambulatory Visit: Payer: PRIVATE HEALTH INSURANCE

## 2014-12-25 DIAGNOSIS — I252 Old myocardial infarction: Secondary | ICD-10-CM | POA: Diagnosis not present

## 2014-12-25 DIAGNOSIS — S40819A Abrasion of unspecified upper arm, initial encounter: Secondary | ICD-10-CM | POA: Diagnosis not present

## 2014-12-25 DIAGNOSIS — I10 Essential (primary) hypertension: Secondary | ICD-10-CM | POA: Diagnosis not present

## 2014-12-25 DIAGNOSIS — S50311A Abrasion of right elbow, initial encounter: Secondary | ICD-10-CM | POA: Diagnosis not present

## 2014-12-25 DIAGNOSIS — Z7982 Long term (current) use of aspirin: Secondary | ICD-10-CM | POA: Diagnosis not present

## 2014-12-25 DIAGNOSIS — Z79899 Other long term (current) drug therapy: Secondary | ICD-10-CM | POA: Diagnosis not present

## 2014-12-25 DIAGNOSIS — S0003XA Contusion of scalp, initial encounter: Secondary | ICD-10-CM | POA: Diagnosis not present

## 2014-12-25 DIAGNOSIS — S80819A Abrasion, unspecified lower leg, initial encounter: Secondary | ICD-10-CM | POA: Diagnosis not present

## 2014-12-25 DIAGNOSIS — S0990XA Unspecified injury of head, initial encounter: Secondary | ICD-10-CM | POA: Diagnosis not present

## 2014-12-25 DIAGNOSIS — S0093XA Contusion of unspecified part of head, initial encounter: Secondary | ICD-10-CM | POA: Diagnosis not present

## 2014-12-25 DIAGNOSIS — R51 Headache: Secondary | ICD-10-CM | POA: Diagnosis not present

## 2014-12-25 DIAGNOSIS — S50312A Abrasion of left elbow, initial encounter: Secondary | ICD-10-CM | POA: Diagnosis not present

## 2014-12-25 DIAGNOSIS — I4891 Unspecified atrial fibrillation: Secondary | ICD-10-CM | POA: Diagnosis not present

## 2014-12-25 DIAGNOSIS — Z86711 Personal history of pulmonary embolism: Secondary | ICD-10-CM | POA: Diagnosis not present

## 2014-12-25 DIAGNOSIS — Z7901 Long term (current) use of anticoagulants: Secondary | ICD-10-CM | POA: Diagnosis not present

## 2014-12-25 DIAGNOSIS — E119 Type 2 diabetes mellitus without complications: Secondary | ICD-10-CM | POA: Diagnosis not present

## 2014-12-25 DIAGNOSIS — W0110XA Fall on same level from slipping, tripping and stumbling with subsequent striking against unspecified object, initial encounter: Secondary | ICD-10-CM | POA: Diagnosis not present

## 2014-12-27 ENCOUNTER — Other Ambulatory Visit: Payer: PRIVATE HEALTH INSURANCE

## 2014-12-31 DIAGNOSIS — Z23 Encounter for immunization: Secondary | ICD-10-CM | POA: Diagnosis not present

## 2015-01-03 ENCOUNTER — Ambulatory Visit
Admission: RE | Admit: 2015-01-03 | Discharge: 2015-01-03 | Disposition: A | Discharge status: Home / Self Care | Payer: Medicare Other | Source: Ambulatory Visit | Attending: Orthopaedic Surgery | Admitting: Orthopaedic Surgery

## 2015-01-03 DIAGNOSIS — M545 Low back pain: Secondary | ICD-10-CM

## 2015-01-03 DIAGNOSIS — M5126 Other intervertebral disc displacement, lumbar region: Secondary | ICD-10-CM | POA: Diagnosis not present

## 2015-01-03 DIAGNOSIS — M4806 Spinal stenosis, lumbar region: Secondary | ICD-10-CM | POA: Diagnosis not present

## 2015-01-08 DIAGNOSIS — M47817 Spondylosis without myelopathy or radiculopathy, lumbosacral region: Secondary | ICD-10-CM | POA: Diagnosis not present

## 2015-01-08 DIAGNOSIS — M5417 Radiculopathy, lumbosacral region: Secondary | ICD-10-CM | POA: Diagnosis not present

## 2015-01-08 DIAGNOSIS — M5441 Lumbago with sciatica, right side: Secondary | ICD-10-CM | POA: Diagnosis not present

## 2015-01-20 DIAGNOSIS — I480 Paroxysmal atrial fibrillation: Secondary | ICD-10-CM | POA: Diagnosis not present

## 2015-02-07 DIAGNOSIS — M5136 Other intervertebral disc degeneration, lumbar region: Secondary | ICD-10-CM | POA: Diagnosis not present

## 2015-02-07 DIAGNOSIS — M9983 Other biomechanical lesions of lumbar region: Secondary | ICD-10-CM | POA: Diagnosis not present

## 2015-02-07 DIAGNOSIS — M4727 Other spondylosis with radiculopathy, lumbosacral region: Secondary | ICD-10-CM | POA: Diagnosis not present

## 2015-02-17 DIAGNOSIS — Z86711 Personal history of pulmonary embolism: Secondary | ICD-10-CM | POA: Diagnosis not present

## 2015-02-17 DIAGNOSIS — M4726 Other spondylosis with radiculopathy, lumbar region: Secondary | ICD-10-CM | POA: Diagnosis not present

## 2015-02-17 DIAGNOSIS — Z79899 Other long term (current) drug therapy: Secondary | ICD-10-CM | POA: Diagnosis not present

## 2015-02-17 DIAGNOSIS — M5136 Other intervertebral disc degeneration, lumbar region: Secondary | ICD-10-CM | POA: Diagnosis not present

## 2015-02-17 DIAGNOSIS — I10 Essential (primary) hypertension: Secondary | ICD-10-CM | POA: Diagnosis not present

## 2015-02-17 DIAGNOSIS — Z7901 Long term (current) use of anticoagulants: Secondary | ICD-10-CM | POA: Diagnosis not present

## 2015-02-17 DIAGNOSIS — Z8249 Family history of ischemic heart disease and other diseases of the circulatory system: Secondary | ICD-10-CM | POA: Diagnosis not present

## 2015-02-17 DIAGNOSIS — Z7984 Long term (current) use of oral hypoglycemic drugs: Secondary | ICD-10-CM | POA: Diagnosis not present

## 2015-02-17 DIAGNOSIS — M9983 Other biomechanical lesions of lumbar region: Secondary | ICD-10-CM | POA: Diagnosis not present

## 2015-02-17 DIAGNOSIS — E119 Type 2 diabetes mellitus without complications: Secondary | ICD-10-CM | POA: Diagnosis not present

## 2015-02-17 DIAGNOSIS — E785 Hyperlipidemia, unspecified: Secondary | ICD-10-CM | POA: Diagnosis not present

## 2015-02-17 DIAGNOSIS — Z818 Family history of other mental and behavioral disorders: Secondary | ICD-10-CM | POA: Diagnosis not present

## 2015-02-17 DIAGNOSIS — M4727 Other spondylosis with radiculopathy, lumbosacral region: Secondary | ICD-10-CM | POA: Diagnosis not present

## 2015-02-17 DIAGNOSIS — M5116 Intervertebral disc disorders with radiculopathy, lumbar region: Secondary | ICD-10-CM | POA: Diagnosis not present

## 2015-02-17 DIAGNOSIS — Z87891 Personal history of nicotine dependence: Secondary | ICD-10-CM | POA: Diagnosis not present

## 2015-02-17 DIAGNOSIS — I252 Old myocardial infarction: Secondary | ICD-10-CM | POA: Diagnosis not present

## 2015-03-04 DIAGNOSIS — M9983 Other biomechanical lesions of lumbar region: Secondary | ICD-10-CM | POA: Diagnosis not present

## 2015-03-04 DIAGNOSIS — M5136 Other intervertebral disc degeneration, lumbar region: Secondary | ICD-10-CM | POA: Diagnosis not present

## 2015-03-04 DIAGNOSIS — M4727 Other spondylosis with radiculopathy, lumbosacral region: Secondary | ICD-10-CM | POA: Diagnosis not present

## 2015-03-05 DIAGNOSIS — R944 Abnormal results of kidney function studies: Secondary | ICD-10-CM | POA: Diagnosis not present

## 2015-03-05 DIAGNOSIS — Z0000 Encounter for general adult medical examination without abnormal findings: Secondary | ICD-10-CM | POA: Diagnosis not present

## 2015-03-05 DIAGNOSIS — K210 Gastro-esophageal reflux disease with esophagitis: Secondary | ICD-10-CM | POA: Diagnosis not present

## 2015-03-05 DIAGNOSIS — E7800 Pure hypercholesterolemia, unspecified: Secondary | ICD-10-CM | POA: Diagnosis not present

## 2015-03-05 DIAGNOSIS — I482 Chronic atrial fibrillation: Secondary | ICD-10-CM | POA: Diagnosis not present

## 2015-03-05 DIAGNOSIS — N400 Enlarged prostate without lower urinary tract symptoms: Secondary | ICD-10-CM | POA: Diagnosis not present

## 2015-03-05 DIAGNOSIS — E1165 Type 2 diabetes mellitus with hyperglycemia: Secondary | ICD-10-CM | POA: Diagnosis not present

## 2015-03-05 DIAGNOSIS — I10 Essential (primary) hypertension: Secondary | ICD-10-CM | POA: Diagnosis not present

## 2015-03-05 DIAGNOSIS — I259 Chronic ischemic heart disease, unspecified: Secondary | ICD-10-CM | POA: Diagnosis not present

## 2015-03-13 DIAGNOSIS — E1165 Type 2 diabetes mellitus with hyperglycemia: Secondary | ICD-10-CM | POA: Diagnosis not present

## 2015-03-13 DIAGNOSIS — R944 Abnormal results of kidney function studies: Secondary | ICD-10-CM | POA: Diagnosis not present

## 2015-03-13 DIAGNOSIS — Z23 Encounter for immunization: Secondary | ICD-10-CM | POA: Diagnosis not present

## 2015-03-13 DIAGNOSIS — I259 Chronic ischemic heart disease, unspecified: Secondary | ICD-10-CM | POA: Diagnosis not present

## 2015-03-27 DIAGNOSIS — R2681 Unsteadiness on feet: Secondary | ICD-10-CM | POA: Diagnosis not present

## 2015-03-27 DIAGNOSIS — I480 Paroxysmal atrial fibrillation: Secondary | ICD-10-CM | POA: Diagnosis not present

## 2015-03-27 DIAGNOSIS — R42 Dizziness and giddiness: Secondary | ICD-10-CM | POA: Diagnosis not present

## 2015-03-27 DIAGNOSIS — Z9181 History of falling: Secondary | ICD-10-CM | POA: Diagnosis not present

## 2015-03-27 DIAGNOSIS — R0602 Shortness of breath: Secondary | ICD-10-CM | POA: Diagnosis not present

## 2015-03-27 DIAGNOSIS — I491 Atrial premature depolarization: Secondary | ICD-10-CM | POA: Diagnosis not present

## 2015-03-27 DIAGNOSIS — S0990XS Unspecified injury of head, sequela: Secondary | ICD-10-CM | POA: Diagnosis not present

## 2015-03-27 DIAGNOSIS — R001 Bradycardia, unspecified: Secondary | ICD-10-CM | POA: Diagnosis not present

## 2015-03-27 DIAGNOSIS — G44309 Post-traumatic headache, unspecified, not intractable: Secondary | ICD-10-CM | POA: Diagnosis not present

## 2015-04-25 DIAGNOSIS — S20212A Contusion of left front wall of thorax, initial encounter: Secondary | ICD-10-CM | POA: Diagnosis not present

## 2015-04-25 DIAGNOSIS — R079 Chest pain, unspecified: Secondary | ICD-10-CM | POA: Diagnosis not present

## 2015-05-08 DIAGNOSIS — I480 Paroxysmal atrial fibrillation: Secondary | ICD-10-CM | POA: Diagnosis not present

## 2015-06-02 DIAGNOSIS — E1165 Type 2 diabetes mellitus with hyperglycemia: Secondary | ICD-10-CM | POA: Diagnosis not present

## 2015-06-11 DIAGNOSIS — E1165 Type 2 diabetes mellitus with hyperglycemia: Secondary | ICD-10-CM | POA: Diagnosis not present

## 2015-07-09 DIAGNOSIS — L570 Actinic keratosis: Secondary | ICD-10-CM | POA: Diagnosis not present

## 2015-09-02 DIAGNOSIS — H35031 Hypertensive retinopathy, right eye: Secondary | ICD-10-CM | POA: Diagnosis not present

## 2015-09-30 DIAGNOSIS — I482 Chronic atrial fibrillation: Secondary | ICD-10-CM | POA: Diagnosis not present

## 2015-09-30 DIAGNOSIS — K210 Gastro-esophageal reflux disease with esophagitis: Secondary | ICD-10-CM | POA: Diagnosis not present

## 2015-09-30 DIAGNOSIS — E1165 Type 2 diabetes mellitus with hyperglycemia: Secondary | ICD-10-CM | POA: Diagnosis not present

## 2015-09-30 DIAGNOSIS — R5383 Other fatigue: Secondary | ICD-10-CM | POA: Diagnosis not present

## 2015-09-30 DIAGNOSIS — Z13220 Encounter for screening for lipoid disorders: Secondary | ICD-10-CM | POA: Diagnosis not present

## 2015-09-30 DIAGNOSIS — R944 Abnormal results of kidney function studies: Secondary | ICD-10-CM | POA: Diagnosis not present

## 2015-09-30 DIAGNOSIS — E7800 Pure hypercholesterolemia, unspecified: Secondary | ICD-10-CM | POA: Diagnosis not present

## 2015-10-02 DIAGNOSIS — I482 Chronic atrial fibrillation: Secondary | ICD-10-CM | POA: Diagnosis not present

## 2015-10-02 DIAGNOSIS — E1165 Type 2 diabetes mellitus with hyperglycemia: Secondary | ICD-10-CM | POA: Diagnosis not present

## 2015-10-02 DIAGNOSIS — Z6832 Body mass index (BMI) 32.0-32.9, adult: Secondary | ICD-10-CM | POA: Diagnosis not present

## 2015-10-02 DIAGNOSIS — Z0000 Encounter for general adult medical examination without abnormal findings: Secondary | ICD-10-CM | POA: Diagnosis not present

## 2015-11-05 ENCOUNTER — Encounter: Payer: Self-pay | Admitting: *Deleted

## 2015-11-09 ENCOUNTER — Encounter: Payer: Self-pay | Admitting: Cardiology

## 2015-11-09 NOTE — Progress Notes (Signed)
Cardiology Office Note  Date: 11/11/2015   ID: Richard [name], DOB 1933/06/02, MRN [id number]  PCP: Richard Hilding, MD  Consulting Cardiologist: Richard Lesches, MD   Chief Complaint  [name] presents with  . Coronary Artery Disease  . PAF    History of Present Illness: Richard [name] is an 82 y.o. male referred to establish cardiology follow-up by Dr. Quintin [name]. He previously followed with Dr. Hamilton [name] with [name] cardiology practice, last seen in March. I reviewed extensive records and updated Richard chart. He is here today with his wife. He tells me that he has been experiencing exertional fatigue and shortness of breath, has to rest when he tries to do things outdoors. No specific angina or nitroglycerin use. He reports NYHA class III symptoms. This has been slowly progressive over Richard last few years. He has also noticed that his heart rate is generally in Richard 90-100 range at rest. He is in atrial fibrillation today, ECG showing heart rate 89 bpm with PVC and low voltage with nonspecific ST changes. Also feels lightheaded sometimes when he stands up. Systolic blood pressure has been on Richard low side.  He was previously on Amiodarone, it sounds like he had prior intolerances and possibly some degree of pulmonary toxicity although records are not clear.  He has a history of ischemic heart disease with mild to moderate left main disease and occluded RCA with collaterals as of evaluation in 2013.  Today we reviewed his medications and discussed changes aimed at better heart rate control as I suspect atrial fibrillation is at least partially contributing to some of his symptoms. We also discussed arranging follow-up ischemic evaluation to get a better sense for his ischemic burden and help guide treatment options.  He does not report any bleeding problems on Xarelto. We discussed stopping aspirin at this time.  He is retired from [employer] in [place]. Worked as a  Dealer.  Past Medical History:  Diagnosis Date  . CAD (coronary artery disease)    Mild-moderate LM with occluded RCA (collaterals) - managed medically 2013  . GERD (gastroesophageal reflux disease)   . Hypertensive heart disease   . Obesity (BMI 30.0-34.9)   . Old inferior myocardial infarction 1995  . PAF (paroxysmal atrial fibrillation) ([place])   . Pulmonary embolus ([place])    [name] 2015    Past Surgical History:  Procedure Laterality Date  . ACHILLES TENDON REPAIR    . APPENDECTOMY    . LEFT HEART CATHETERIZATION WITH CORONARY ANGIOGRAM N/A 01/24/2012   Procedure: LEFT HEART CATHETERIZATION WITH CORONARY ANGIOGRAM;  Surgeon: Richard Reedy, MD;  Location[name];  Service: Cardiovascular;  Laterality: N/A;  . SHOULDER [name]      Current Outpatient Prescriptions  Medication Sig Dispense Refill  . atorvastatin (LIPITOR) 10 MG tablet Take 10 mg by mouth every evening.    . diltiazem (CARDIZEM CD) 240 MG 24 hr capsule Take 240 mg by mouth daily.    . metFORMIN (GLUCOPHAGE-XR) 500 MG 24 hr tablet Take 1,000 mg by mouth daily with breakfast.     . naproxen sodium (ANAPROX) 220 MG tablet Take 440 mg by mouth daily as needed. For pain    . nitroGLYCERIN (NITROSTAT) 0.4 MG SL tablet Place 1 tablet (0.4 mg total) under Richard tongue every 5 (five) minutes x 3 doses as needed for chest pain. 25 tablet 12  . rivaroxaban (XARELTO) 20 MG TABS tablet Take 20 mg by mouth daily with supper.    Marland Kitchen  valsartan (DIOVAN) 40 MG tablet Take 1 tablet (40 mg total) by mouth daily. 30 tablet 6  . metoprolol succinate (TOPROL XL) 25 MG 24 hr tablet Take 0.5 tablets (12.5 mg total) by mouth daily. 15 tablet 6   No current facility-administered medications for this visit.    Allergies:  Metoprolol and Niaspan [niacin er]   Social History: Richard [name]  reports that he quit smoking about 22 years ago. He has never used smokeless tobacco. He reports that he does not drink alcohol or use drugs.    Family History: Richard [name]'s family history includes Heart failure in his mother.   ROS:  Please see Richard history of present illness. Otherwise, complete review of systems is positive for arthritic pains, right sided sciatica pain.  All other systems are reviewed and negative.   Physical Exam: VS:  BP 110/72   Pulse 77   Ht 5\' 8"  (1.727 m)   Wt 210 lb (95.3 kg)   SpO2 98%   BMI 31.93 kg/m , BMI Body mass index is 31.93 kg/m.  Wt Readings from Last 3 Encounters:  11/11/15 210 lb (95.3 kg)  01/23/12 211 lb 9.6 oz (96 kg)    General: Overweight elderly male, appears comfortable at rest. HEENT: Conjunctiva and lids normal, oropharynx clear. Neck: Supple, no elevated JVP or carotid bruits, no thyromegaly. Lungs: Decreased breath sounds without wheezing, nonlabored breathing at rest. Cardiac: Irregularly irregular, no S3, soft systolic murmur, no pericardial rub. Abdomen: Soft, nontender, bowel sounds present, no guarding or rebound. Extremities: No pitting edema, distal pulses 2+. Skin: Warm and dry. Musculoskeletal: Mild kyphosis. Neuropsychiatric: Alert and oriented x3, affect grossly appropriate.  ECG: I personally reviewed Richard tracing from 01/22/2012 which showed sinus bradycardia, NSST changes.  Recent Labwork:    Component Value Date/Time   CHOL 141 01/22/2012 0515   TRIG 122 01/22/2012 0515   HDL 36 (L) 01/22/2012 0515   CHOLHDL 3.9 01/22/2012 0515   VLDL 24 01/22/2012 0515   Richard [name] 81 01/22/2012 0515    Other Studies Reviewed Today:  Echocardiogram February 2015 [facility]): Mild LVH, LVEF 55-60%, moderate left atrial enlargement, mild aortic root dilatation, mild aortic regurgitation, moderate mitral regurgitation, mild tricuspid regurgitation.  Assessment and Plan:  1. Persistent/chronic atrial fibrillation, duration of present rhythm is not certain. He was last seen in Richard [name] practice back in March. CHADSVASC score is 4. Possibly related to some of his  exertional symptoms. We will strive for better heart rate control with Richard addition of Toprol-XL 12.5 mg daily (he had been on atenolol in Richard past). Otherwise continue present dose of Cardizem CD. He will continue on Xarelto, stop aspirin at this time.  2. Essential hypertension, blood pressures relatively low recently. Particularly with Richard above addition of Toprol-XL, we will have him stop chlorthalidone and reduce Diovan to 40 mg daily. He has a blood pressure cuff and will check at home.  3. Ischemic heart disease with mild to moderate left main stenosis and occluded RCA with collaterals as of 2013. We will obtain an echocardiogram and Lexiscan Myoview to follow-up on cardiac structural and ischemic evaluation. This will help to guide further management in terms of medical therapy versus considering invasive testing.  4. Prior history of pulmonary embolus in 2015. He is on long-term anticoagulation for atrial fibrillation.  Current medicines were reviewed with Richard [name] today.   Orders Placed This Encounter  Procedures  . NM Myocar Multi W/Spect W/Wall Motion / EF  . Myocardial Perfusion  Imaging  . EKG 12-Lead  . ECHOCARDIOGRAM COMPLETE    Disposition: Follow-up with me in 4-6 weeks.  Signed, Satira Sark, MD, [facility] 11/11/2015 9:09 AM    [facility] [place], [place], [place] [address] Phone: [phone]; Fax: [phone]

## 2015-11-11 ENCOUNTER — Ambulatory Visit (INDEPENDENT_AMBULATORY_CARE_PROVIDER_SITE_OTHER): Payer: Medicare Other | Admitting: Cardiology

## 2015-11-11 ENCOUNTER — Encounter: Payer: Self-pay | Admitting: *Deleted

## 2015-11-11 ENCOUNTER — Encounter: Payer: Self-pay | Admitting: Cardiology

## 2015-11-11 VITALS — BP 110/72 | HR 77 | Ht 68.0 in | Wt 210.0 lb

## 2015-11-11 DIAGNOSIS — Z86711 Personal history of pulmonary embolism: Secondary | ICD-10-CM | POA: Diagnosis not present

## 2015-11-11 DIAGNOSIS — I4819 Other persistent atrial fibrillation: Secondary | ICD-10-CM

## 2015-11-11 DIAGNOSIS — I481 Persistent atrial fibrillation: Secondary | ICD-10-CM | POA: Diagnosis not present

## 2015-11-11 DIAGNOSIS — I10 Essential (primary) hypertension: Secondary | ICD-10-CM

## 2015-11-11 DIAGNOSIS — R0609 Other forms of dyspnea: Secondary | ICD-10-CM

## 2015-11-11 DIAGNOSIS — I25119 Atherosclerotic heart disease of native coronary artery with unspecified angina pectoris: Secondary | ICD-10-CM

## 2015-11-11 MED ORDER — VALSARTAN 40 MG PO TABS: 40.0000 mg | ORAL_TABLET | Freq: Every day | ORAL | 6 refills | Status: DC

## 2015-11-11 MED ORDER — METOPROLOL SUCCINATE ER 25 MG PO TB24: 12.5000 mg | ORAL_TABLET | Freq: Every day | ORAL | 6 refills | Status: DC

## 2015-11-11 NOTE — Patient Instructions (Signed)
Medication Instructions:   Remain off of the Atenolol.  Stop Aspirin.  Stop Chlorthalicone.  Begin Toprol XL 12.5mg  daily.  Decrease Diovan to 40mg  daily, may take 1/2 tab of the 80mg  tablet till finish current supply.   Continue all other medications.    Labwork: none  Testing/Procedures:  Your physician has requested that you have an echocardiogram. Echocardiography is a painless test that uses sound waves to create images of your heart. It provides your doctor with information about the size and shape of your heart and how well your heart's chambers and valves are working. This procedure takes approximately one hour. There are no restrictions for this procedure.  Your physician has requested that you have a lexiscan myoview. For further information please visit [email or web address]. Please follow instruction sheet, as given.  Office will contact with results via phone or letter.    Follow-Up: 4-6 weeks  Any Other Special Instructions Will Be Listed Below (If Applicable).  If you need a refill on your cardiac medications before your next appointment, please call your pharmacy.

## 2015-11-19 ENCOUNTER — Encounter (HOSPITAL_COMMUNITY)
Admission: RE | Admit: 2015-11-19 | Discharge: 2015-11-19 | Disposition: A | Discharge status: Home / Self Care | Payer: Medicare Other | Source: Ambulatory Visit | Attending: Cardiology | Admitting: Cardiology

## 2015-11-19 ENCOUNTER — Encounter (HOSPITAL_COMMUNITY): Payer: Self-pay

## 2015-11-19 ENCOUNTER — Encounter (HOSPITAL_COMMUNITY): Admission: RE | Admit: 2015-11-19 | Payer: Medicare Other | Source: Ambulatory Visit

## 2015-11-19 DIAGNOSIS — I25119 Atherosclerotic heart disease of native coronary artery with unspecified angina pectoris: Secondary | ICD-10-CM

## 2015-11-19 LAB — NM MYOCAR MULTI W/SPECT W/WALL MOTION / EF
CHL CUP NUCLEAR SDS: 4
CHL CUP NUCLEAR SRS: 2
CHL CUP NUCLEAR SSS: 6
CHL CUP RESTING HR STRESS: 100 {beats}/min
LV dias vol: 63 mL (ref 62–150)
LV sys vol: 34 mL
NUC STRESS TID: 1.13
Peak HR: 125 {beats}/min
RATE: 0.34

## 2015-11-19 MED ORDER — SODIUM CHLORIDE 0.9% FLUSH
INTRAVENOUS | Status: AC
  Administered 2015-11-19: 10 mL via INTRAVENOUS
  Filled 2015-11-19: qty 10

## 2015-11-19 MED ORDER — REGADENOSON 0.4 MG/5ML IV SOLN
INTRAVENOUS | Status: AC
  Administered 2015-11-19: 0.4 mg via INTRAVENOUS
  Filled 2015-11-19: qty 5

## 2015-11-19 MED ORDER — TECHNETIUM TC 99M TETROFOSMIN IV KIT
10.0000 | PACK | Freq: Once | INTRAVENOUS | Status: AC | PRN
  Administered 2015-11-19: 10.14 via INTRAVENOUS

## 2015-11-19 MED ORDER — TECHNETIUM TC 99M TETROFOSMIN IV KIT
30.0000 | PACK | Freq: Once | INTRAVENOUS | Status: AC | PRN
  Administered 2015-11-19: 30 via INTRAVENOUS

## 2015-11-24 ENCOUNTER — Telehealth: Payer: Self-pay | Admitting: *Deleted

## 2015-11-24 NOTE — Telephone Encounter (Signed)
-----   Message from Satira Sark, MD sent at 11/20/2015  7:25 AM EDT ----- Results reviewed. Study is consistent with known CAD and occluded RCA with collaterals. We can likely continue medical therapy and observation if symptomatically stable. Will review echocardiogram results and plan to discuss further at follow-up. A copy of this test should be forwarded to [facility], MD.

## 2015-11-24 NOTE — Telephone Encounter (Signed)
Patient informed and copy sent to PCP. 

## 2015-11-26 ENCOUNTER — Other Ambulatory Visit: Payer: Self-pay

## 2015-11-26 ENCOUNTER — Ambulatory Visit (INDEPENDENT_AMBULATORY_CARE_PROVIDER_SITE_OTHER): Payer: Medicare Other

## 2015-11-26 ENCOUNTER — Telehealth: Payer: Self-pay | Admitting: *Deleted

## 2015-11-26 DIAGNOSIS — I25119 Atherosclerotic heart disease of native coronary artery with unspecified angina pectoris: Secondary | ICD-10-CM | POA: Diagnosis not present

## 2015-11-26 LAB — ECHOCARDIOGRAM COMPLETE
AVPHT: 1056 ms
CHL CUP DOP CALC LVOT VTI: 14 cm
CHL CUP MV DEC (S): 90
CHL CUP PV REG GRAD DIAS: 8 mmHg
CHL CUP REG VEL DIAS: 144 cm/s
CHL CUP STROKE VOLUME: 41 mL
E decel time: 90 msec
EERAT: 6.96
FS: 39 % (ref 28–44)
IVS/LV PW RATIO, ED: 1.02
LA ID, A-P, ES: 48 mm
LA diam end sys: 48 mm
LA vol: 96.3 mL
LADIAMINDEX: 2.3 cm/m2
LAVOLA4C: 95.6 mL
LAVOLIN: 46.1 mL/m2
LDCA: 3.8 cm2
LV E/e'average: 6.96
LV PW d: 8.64 mm — AB (ref 0.6–1.1)
LV dias vol index: 32 mL/m2
LV e' LATERAL: 15.8 cm/s
LV sys vol index: 13 mL/m2
LVDIAVOL: 68 mL (ref 62–150)
LVEEMED: 6.96
LVOT SV: 53 mL
LVOTD: 22 mm
LVOTPV: 82.5 cm/s
LVSYSVOL: 27 mL
MV Peak grad: 5 mmHg
MV pk E vel: 110 m/s
MVPKAVEL: 26.9 m/s
RV TAPSE: 14.7 mm
RV sys press: 23 mmHg
Reg peak vel: 223 cm/s
Simpson's disk: 60
TDI e' lateral: 15.8
TDI e' medial: 12.8
TRMAXVEL: 223 cm/s

## 2015-11-26 NOTE — Telephone Encounter (Signed)
Pt aware - confirmed 9/26 f/u - routed to pcp

## 2015-11-26 NOTE — Telephone Encounter (Signed)
-----   Message from Satira Sark, MD sent at 11/26/2015  2:30 PM EDT ----- Results reviewed. LVEF is normal range. Aortic root mildly dilated but unlikely to be of clinical significance, we can follow this over time. No major valvular abnormalities. Will follow-up on stress testing. A copy of this test should be forwarded to [facility], MD.

## 2015-12-02 ENCOUNTER — Ambulatory Visit (INDEPENDENT_AMBULATORY_CARE_PROVIDER_SITE_OTHER): Payer: Medicare Other | Admitting: Cardiology

## 2015-12-02 ENCOUNTER — Encounter: Payer: Self-pay | Admitting: Cardiology

## 2015-12-02 VITALS — BP 121/81 | HR 94 | Ht 68.0 in | Wt 218.6 lb

## 2015-12-02 DIAGNOSIS — I25119 Atherosclerotic heart disease of native coronary artery with unspecified angina pectoris: Secondary | ICD-10-CM

## 2015-12-02 DIAGNOSIS — I10 Essential (primary) hypertension: Secondary | ICD-10-CM | POA: Diagnosis not present

## 2015-12-02 DIAGNOSIS — I209 Angina pectoris, unspecified: Secondary | ICD-10-CM | POA: Diagnosis not present

## 2015-12-02 DIAGNOSIS — I481 Persistent atrial fibrillation: Secondary | ICD-10-CM

## 2015-12-02 DIAGNOSIS — I4819 Other persistent atrial fibrillation: Secondary | ICD-10-CM

## 2015-12-02 MED ORDER — METOPROLOL SUCCINATE ER 25 MG PO TB24: 25.0000 mg | ORAL_TABLET | Freq: Every day | ORAL | 3 refills | Status: DC

## 2015-12-02 NOTE — Progress Notes (Signed)
Cardiology Office Note  Date: 12/02/2015   ID: Richard [name], DOB 04/30/1933, MRN [id number]  PCP: Manon Hilding, MD  Primary Cardiologist: Rozann Lesches, MD   Chief Complaint  Patient presents with  . Follow-up testing    History of Present Illness: Richard [name] is an 82 y.o. male seen recently in early September to establish cardiology follow-up. We made medication adjustments at that visit and also referred him for follow-up cardiac ischemic and structural testing.  Results are outlined below, we discussed this today. Ischemic testing is consistent with his history and overall LVEF is normal by echocardiography. He states he does feel better since we put him on Toprol-XL and adjusted his other antihypertensives. We went over home blood pressure and heart rate checks, heart rate is better but not optimal in atrial fibrillation. We have discussed increasing Toprol-XL to 25 mg daily.  Past Medical History:  Diagnosis Date  . CAD (coronary artery disease)    Mild-moderate LM with occluded RCA (collaterals) - managed medically 2013  . GERD (gastroesophageal reflux disease)   . Hypertensive heart disease   . Obesity (BMI 30.0-34.9)   . Old inferior myocardial infarction 1995  . PAF (paroxysmal atrial fibrillation) ([place])   . Pulmonary embolus ([place])    [name] 2015    Current Outpatient Prescriptions  Medication Sig Dispense Refill  . atorvastatin (LIPITOR) 10 MG tablet Take 10 mg by mouth every evening.    . diltiazem (CARDIZEM CD) 240 MG 24 hr capsule Take 240 mg by mouth daily.    . metFORMIN (GLUCOPHAGE-XR) 500 MG 24 hr tablet Take 1,000 mg by mouth daily with breakfast.     . nitroGLYCERIN (NITROSTAT) 0.4 MG SL tablet Place 1 tablet (0.4 mg total) under the tongue every 5 (five) minutes x 3 doses as needed for chest pain. 25 tablet 12  . rivaroxaban (XARELTO) 20 MG TABS tablet Take 20 mg by mouth daily with supper.    . valsartan (DIOVAN) 40 MG tablet Take 1  tablet (40 mg total) by mouth daily. 30 tablet 6  . metoprolol succinate (TOPROL XL) 25 MG 24 hr tablet Take 1 tablet (25 mg total) by mouth daily. 90 tablet 3   No current facility-administered medications for this visit.    Allergies:  Metoprolol and Niaspan [niacin er]   Social History: The patient  reports that he quit smoking about 22 years ago. He has never used smokeless tobacco. He reports that he does not drink alcohol or use drugs.   ROS:  Please see the history of present illness. Otherwise, complete review of systems is positive for none.  All other systems are reviewed and negative.   Physical Exam: VS:  BP 121/81   Pulse 94   Ht 5\' 8"  (1.727 m)   Wt 218 lb 9.6 oz (99.2 kg)   SpO2 94%   BMI 33.24 kg/m , BMI Body mass index is 33.24 kg/m.  Wt Readings from Last 3 Encounters:  12/02/15 218 lb 9.6 oz (99.2 kg)  11/11/15 210 lb (95.3 kg)  01/23/12 211 lb 9.6 oz (96 kg)    General: Overweight elderly male, appears comfortable at rest. HEENT: Conjunctiva and lids normal, oropharynx clear. Neck: Supple, no elevated JVP or carotid bruits, no thyromegaly. Lungs: Decreased breath sounds without wheezing, nonlabored breathing at rest. Cardiac: Irregularly irregular, no S3, soft systolic murmur, no pericardial rub. Abdomen: Soft, nontender, bowel sounds present, no guarding or rebound. Extremities: No pitting edema, distal pulses 2+.  ECG: I personally reviewed the tracing from 11/11/2015 which showed rate-controlled atrial fibrillation with PVC and low voltage.  Other Studies Reviewed Today:  Echocardiogram 11/26/2015: Study Conclusions  - Left ventricle: The cavity size was normal. Wall thickness was   normal. Systolic function was normal. The estimated ejection   fraction was in the range of 55% to 60%. Although no diagnostic   regional wall motion abnormality was identified, this possibility   cannot be completely excluded on the basis of this study. The   study is  not technically sufficient to allow evaluation of LV   diastolic function. - Aortic valve: Mildly calcified annulus. Trileaflet; mildly   calcified leaflets. There was trivial regurgitation. - Aortic root: The aortic root was mildly dilated. - Mitral valve: Calcified annulus. There was trivial regurgitation. - Left atrium: The atrium was moderately dilated. - Right ventricle: The cavity size was mildly dilated. Systolic   function was mildly reduced. - Right atrium: The atrium was mildly dilated. Central venous   pressure (est): 3 mm Hg. - Tricuspid valve: There was mild regurgitation. - Pulmonary arteries: PA peak pressure: 23 mm Hg (S). - Pericardium, extracardiac: There was no pericardial effusion.  Impressions:  - Normal LV wall thickness with LVEF 55-60%. Indeterminate   diastolic function in the setting of atrial fibrillation.   Moderate left atrial enlargement. MAC with trivial mitral   regurgitation. Mildly dilated aortic root. Sclerotic aortic valve   with trivial aortic regurgitation. Mildly dilated RV with mildly   reduced contraction. Mild tricuspid regurgitation with PASP 23   mmHg.  Lexiscan Myoview 11/19/2015:  There was no ST segment deviation noted during stress.  Defect 1: There is a medium defect of mild severity present in the basal inferoseptal, basal inferior, basal inferolateral, mid inferoseptal, mid inferior and mid inferolateral location.  Findings consistent with prior myocardial infarction with peri-infarct ischemia.  This is an intermediate risk study.  Nuclear stress EF: 45%.  Assessment and Plan:  1. CAD with occluded RCA associate with collaterals and mild to moderate left main disease. Recent Myoview was consistent with inferior scar and some degree of peri-infarct ischemia. Plan to continue medical therapy at this point, LVEF normal range by echocardiography.  2. Paroxysmal to persistent atrial fibrillation. Increase Toprol-XL to 25 mg daily  for now. Continue Xarelto for stroke prophylaxis.  3. Essential hypertension, blood pressure control looks to be adequate following medication adjustments.  Current medicines were reviewed with the patient today.  Disposition: Follow-up with me in 6 months.  Signed, Satira Sark, MD, [facility] 12/02/2015 3:49 PM    [facility] at [place], [place], [place] [address] Phone: [phone]; Fax: [phone]

## 2015-12-02 NOTE — Patient Instructions (Signed)
Your physician wants you to follow-up in: [place] DR. Domenic Polite You will receive a reminder letter in the mail two months in advance. If you don't receive a letter, please call our office to schedule the follow-up appointment.  Your physician has recommended you make the following change in your medication:   CHANGE TOPROL XL 25 MG DAILY  Thank you for choosing [facility]!!

## 2015-12-04 ENCOUNTER — Other Ambulatory Visit: Payer: Self-pay | Admitting: *Deleted

## 2015-12-04 MED ORDER — VALSARTAN 40 MG PO TABS: 40.0000 mg | ORAL_TABLET | Freq: Every day | ORAL | 3 refills | Status: DC

## 2015-12-10 DIAGNOSIS — Z23 Encounter for immunization: Secondary | ICD-10-CM | POA: Diagnosis not present

## 2015-12-11 ENCOUNTER — Ambulatory Visit (INDEPENDENT_AMBULATORY_CARE_PROVIDER_SITE_OTHER): Payer: Medicare Other | Admitting: Orthopaedic Surgery

## 2015-12-11 DIAGNOSIS — M79604 Pain in right leg: Secondary | ICD-10-CM | POA: Diagnosis not present

## 2015-12-25 ENCOUNTER — Encounter (INDEPENDENT_AMBULATORY_CARE_PROVIDER_SITE_OTHER): Payer: Medicare Other | Admitting: Physical Medicine and Rehabilitation

## 2015-12-25 DIAGNOSIS — M5417 Radiculopathy, lumbosacral region: Secondary | ICD-10-CM

## 2016-01-07 DIAGNOSIS — L570 Actinic keratosis: Secondary | ICD-10-CM | POA: Diagnosis not present

## 2016-03-09 DIAGNOSIS — J209 Acute bronchitis, unspecified: Secondary | ICD-10-CM | POA: Diagnosis not present

## 2016-03-09 DIAGNOSIS — J019 Acute sinusitis, unspecified: Secondary | ICD-10-CM | POA: Diagnosis not present

## 2016-03-09 DIAGNOSIS — Z6833 Body mass index (BMI) 33.0-33.9, adult: Secondary | ICD-10-CM | POA: Diagnosis not present

## 2016-03-12 DIAGNOSIS — I10 Essential (primary) hypertension: Secondary | ICD-10-CM | POA: Diagnosis not present

## 2016-03-12 DIAGNOSIS — Z6833 Body mass index (BMI) 33.0-33.9, adult: Secondary | ICD-10-CM | POA: Diagnosis not present

## 2016-03-12 DIAGNOSIS — J189 Pneumonia, unspecified organism: Secondary | ICD-10-CM | POA: Diagnosis not present

## 2016-03-12 DIAGNOSIS — I482 Chronic atrial fibrillation: Secondary | ICD-10-CM | POA: Diagnosis not present

## 2016-03-15 DIAGNOSIS — J189 Pneumonia, unspecified organism: Secondary | ICD-10-CM | POA: Diagnosis not present

## 2016-03-15 DIAGNOSIS — I482 Chronic atrial fibrillation: Secondary | ICD-10-CM | POA: Diagnosis not present

## 2016-03-15 DIAGNOSIS — I10 Essential (primary) hypertension: Secondary | ICD-10-CM | POA: Diagnosis not present

## 2016-03-16 DIAGNOSIS — R918 Other nonspecific abnormal finding of lung field: Secondary | ICD-10-CM | POA: Diagnosis not present

## 2016-03-16 DIAGNOSIS — R05 Cough: Secondary | ICD-10-CM | POA: Diagnosis not present

## 2016-03-16 DIAGNOSIS — R911 Solitary pulmonary nodule: Secondary | ICD-10-CM | POA: Diagnosis not present

## 2016-03-18 DIAGNOSIS — Z6833 Body mass index (BMI) 33.0-33.9, adult: Secondary | ICD-10-CM | POA: Diagnosis not present

## 2016-03-18 DIAGNOSIS — J189 Pneumonia, unspecified organism: Secondary | ICD-10-CM | POA: Diagnosis not present

## 2016-03-18 DIAGNOSIS — I10 Essential (primary) hypertension: Secondary | ICD-10-CM | POA: Diagnosis not present

## 2016-03-18 DIAGNOSIS — I482 Chronic atrial fibrillation: Secondary | ICD-10-CM | POA: Diagnosis not present

## 2016-04-01 DIAGNOSIS — R944 Abnormal results of kidney function studies: Secondary | ICD-10-CM | POA: Diagnosis not present

## 2016-04-01 DIAGNOSIS — I259 Chronic ischemic heart disease, unspecified: Secondary | ICD-10-CM | POA: Diagnosis not present

## 2016-04-01 DIAGNOSIS — E1165 Type 2 diabetes mellitus with hyperglycemia: Secondary | ICD-10-CM | POA: Diagnosis not present

## 2016-04-01 DIAGNOSIS — I482 Chronic atrial fibrillation: Secondary | ICD-10-CM | POA: Diagnosis not present

## 2016-04-01 DIAGNOSIS — E7800 Pure hypercholesterolemia, unspecified: Secondary | ICD-10-CM | POA: Diagnosis not present

## 2016-04-01 DIAGNOSIS — I10 Essential (primary) hypertension: Secondary | ICD-10-CM | POA: Diagnosis not present

## 2016-04-01 DIAGNOSIS — K210 Gastro-esophageal reflux disease with esophagitis: Secondary | ICD-10-CM | POA: Diagnosis not present

## 2016-04-05 DIAGNOSIS — I259 Chronic ischemic heart disease, unspecified: Secondary | ICD-10-CM | POA: Diagnosis not present

## 2016-04-05 DIAGNOSIS — Z6833 Body mass index (BMI) 33.0-33.9, adult: Secondary | ICD-10-CM | POA: Diagnosis not present

## 2016-04-05 DIAGNOSIS — E1165 Type 2 diabetes mellitus with hyperglycemia: Secondary | ICD-10-CM | POA: Diagnosis not present

## 2016-04-14 ENCOUNTER — Telehealth: Payer: Self-pay | Admitting: Cardiology

## 2016-04-14 NOTE — Telephone Encounter (Signed)
Dr. Zipporah Plants called about having to do an extraction on Mr. Holsten.  He needs to know if patient needs to come off of the rivaroxaban (XARELTO) 20 MG TABS   Please call the office [phone]. Dr. Joya Gaskins said that you can leave a message.

## 2016-04-14 NOTE — Telephone Encounter (Signed)
Would hold Xarelto greater than 24 hours prior to dental extraction. Can resume within a few days presuming hemostasis is achieved.

## 2016-04-14 NOTE — Telephone Encounter (Signed)
Left message for Dr. Bettina Gavia office to call us back.

## 2016-04-15 NOTE — Telephone Encounter (Signed)
Made another attempt to call Dr. Bettina Gavia office. They are closed until Monday 04/19/16.

## 2016-04-16 NOTE — Telephone Encounter (Signed)
Dr. Joya Gaskins notified via voice mail.

## 2016-06-03 NOTE — Progress Notes (Signed)
Cardiology Office Note  Date: 06/04/2016   ID: RUARI MUDGETT, DOB [birth year], MRN [id number]  PCP: Manon Hilding, MD  Primary Cardiologist: Rozann Lesches, MD   Chief Complaint  Patient presents with  . Atrial Fibrillation  . Coronary Artery Disease    History of Present Illness: Richard [name] is an 83 y.o. male last seen in September 2017. He presents for a routine follow-up visit. Describes somewhat limited stamina and dyspnea on exertion, no definite palpitations or chest pain. When I had him get up on the examination table his heart rate went up to 110 quite quickly. He states that he has been compliant with his medications.  We discussed obtaining recent follow-up lab work. We plan to check with Dr. Edythe Lynn office to see if he has had a recent CBC and BMET, otherwise this will be arranged. He is not reporting any bleeding on Xarelto.  I went over his remaining medications. He is on a combination of Cardizem CD and Toprol-XL for heart rate control. We discussed further advancing his Toprol-XL dose.  Past Medical History:  Diagnosis Date  . CAD (coronary artery disease)    Mild-moderate LM with occluded RCA (collaterals) - managed medically 2013  . GERD (gastroesophageal reflux disease)   . Hypertensive heart disease   . Obesity (BMI 30.0-34.9)   . Old inferior myocardial infarction 1995  . PAF (paroxysmal atrial fibrillation) ([place])   . Pulmonary embolus ([place])    [name] 2015    Past Surgical History:  Procedure Laterality Date  . ACHILLES TENDON REPAIR    . APPENDECTOMY    . LEFT HEART CATHETERIZATION WITH CORONARY ANGIOGRAM N/A 01/24/2012   Procedure: LEFT HEART CATHETERIZATION WITH CORONARY ANGIOGRAM;  Surgeon: Jacolyn Reedy, MD;  Location: [facility] CATH LAB;  Service: Cardiovascular;  Laterality: N/A;  . SHOULDER SURGERY      Current Outpatient Prescriptions  Medication Sig Dispense Refill  . atorvastatin (LIPITOR) 10 MG tablet Take 10 mg by mouth  every evening.    . diltiazem (CARDIZEM CD) 240 MG 24 hr capsule Take 240 mg by mouth daily.    . metFORMIN (GLUCOPHAGE-XR) 500 MG 24 hr tablet Take 1,000 mg by mouth daily with breakfast.     . metoprolol succinate (TOPROL-XL) 50 MG 24 hr tablet Take 1 tablet (50 mg total) by mouth daily. 90 tablet 3  . nitroGLYCERIN (NITROSTAT) 0.4 MG SL tablet Place 1 tablet (0.4 mg total) under the tongue every 5 (five) minutes x 3 doses as needed for chest pain. 25 tablet 12  . rivaroxaban (XARELTO) 20 MG TABS tablet Take 20 mg by mouth daily with supper.    . valsartan (DIOVAN) 40 MG tablet Take 1 tablet (40 mg total) by mouth daily. 90 tablet 3   No current facility-administered medications for this visit.    Allergies:  Metoprolol and Niaspan [niacin er]   Social History: The patient  reports that he quit smoking about 23 years ago. He smoked 0.25 packs per day. He has never used smokeless tobacco. He reports that he does not drink alcohol or use drugs.   ROS:  Please see the history of present illness. Otherwise, complete review of systems is positive for pneumonia over the went are.  All other systems are reviewed and negative.   Physical Exam: VS:  BP (!) 135/92 (BP Location: Left Arm, Patient Position: Sitting, Cuff Size: Normal)   Pulse 78   Ht _0  (1.727 m)   Wt 212 lb  3.2 oz (96.3 kg)   SpO2 97%   BMI 32.26 kg/m , BMI Body mass index is 32.26 kg/m.  Wt Readings from Last 3 Encounters:  06/04/16 212 lb 3.2 oz (96.3 kg)  12/02/15 218 lb 9.6 oz (99.2 kg)  11/11/15 210 lb (95.3 kg)    General: Overweight elderly male,appears comfortable at rest. HEENT: Conjunctiva and lids normal, oropharynx clear. Neck: Supple, no elevated JVP or carotid bruits, no thyromegaly. Lungs: Decreased breath sounds without wheezing, nonlabored breathing at rest. Cardiac: Irregularly irregular, no S3, softsystolic murmur, no pericardial rub. Abdomen: Soft, nontender, bowel sounds present, no guarding or  rebound. Extremities: No pitting edema, distal pulses 2+. Skin: Warm and dry. Musculoskeletal: No kyphosis. Neuropsychiatric: Alert and oriented 3, affect appropriate.  ECG: I personally reviewed the tracing from 11/11/2015 which showed rate-controlled atrial fibrillation with PVC and low voltage.  Other Studies Reviewed Today:  Echocardiogram 11/26/2015: Study Conclusions  - Left ventricle: The cavity size was normal. Wall thickness was normal. Systolic function was normal. The estimated ejection fraction was in the range of 55% to 60%. Although no diagnostic regional wall motion abnormality was identified, this possibility cannot be completely excluded on the basis of this study. The study is not technically sufficient to allow evaluation of LV diastolic function. - Aortic valve: Mildly calcified annulus. Trileaflet; mildly calcified leaflets. There was trivial regurgitation. - Aortic root: The aortic root was mildly dilated. - Mitral valve: Calcified annulus. There was trivial regurgitation. - Left atrium: The atrium was moderately dilated. - Right ventricle: The cavity size was mildly dilated. Systolic function was mildly reduced. - Right atrium: The atrium was mildly dilated. Central venous pressure (est): 3 mm Hg. - Tricuspid valve: There was mild regurgitation. - Pulmonary arteries: PA peak pressure: 23 mm Hg (S). - Pericardium, extracardiac: There was no pericardial effusion.  Impressions:  - Normal LV wall thickness with LVEF 55-60%. Indeterminate diastolic function in the setting of atrial fibrillation. Moderate left atrial enlargement. MAC with trivial mitral regurgitation. Mildly dilated aortic root. Sclerotic aortic valve with trivial aortic regurgitation. Mildly dilated RV with mildly reduced contraction. Mild tricuspid regurgitation with PASP 23 mmHg.  Lexiscan Myoview 11/19/2015:  There was no ST segment deviation noted  during stress.  Defect 1: There is a medium defect of mild severity present in the basal inferoseptal, basal inferior, basal inferolateral, mid inferoseptal, mid inferior and mid inferolateral location.  Findings consistent with prior myocardial infarction with peri-infarct ischemia.  This is an intermediate risk study.  Nuclear stress EF: 45%.  Assessment and Plan:  1. Persistent atrial fibrillation. Question whether elevated heart rate with activity is contributing to his decrease in stamina. We will increase Toprol-XL from 25 mg daily to 50 mg daily. Otherwise obtain most recent lab work from Dr. Quintin Alto, if he has not had a recent CBC and BMET these will be arranged.  2. CAD including mild to moderate left main disease with occluded RCA associated with collaterals. He continues on medical therapy without angina and had a follow-up Myoview last fall showing evidence of inferior scar with peri-infarct ischemia. LVEF 55-60% by echocardiography.  3. Essential hypertension, plan to continue current treatment other than the increase in Toprol-XL.  4. Tobacco abuse in remission.  Current medicines were reviewed with the patient today.  Disposition: Follow-up in 6 months.   Signed, Satira Sark, MD, [facility] 06/04/2016 8:26 AM    [facility] at [place], [place], [place] [address] Phone: [phone])  [phone]; Fax: [phone]

## 2016-06-04 ENCOUNTER — Encounter: Payer: Self-pay | Admitting: Cardiology

## 2016-06-04 ENCOUNTER — Encounter: Payer: Self-pay | Admitting: *Deleted

## 2016-06-04 ENCOUNTER — Ambulatory Visit (INDEPENDENT_AMBULATORY_CARE_PROVIDER_SITE_OTHER): Payer: Medicare Other | Admitting: Cardiology

## 2016-06-04 ENCOUNTER — Other Ambulatory Visit: Payer: Self-pay | Admitting: *Deleted

## 2016-06-04 VITALS — BP 135/92 | HR 78 | Ht 68.0 in | Wt 212.2 lb

## 2016-06-04 DIAGNOSIS — Z7901 Long term (current) use of anticoagulants: Secondary | ICD-10-CM

## 2016-06-04 DIAGNOSIS — I209 Angina pectoris, unspecified: Secondary | ICD-10-CM

## 2016-06-04 DIAGNOSIS — I481 Persistent atrial fibrillation: Secondary | ICD-10-CM | POA: Diagnosis not present

## 2016-06-04 DIAGNOSIS — F17201 Nicotine dependence, unspecified, in remission: Secondary | ICD-10-CM

## 2016-06-04 DIAGNOSIS — I10 Essential (primary) hypertension: Secondary | ICD-10-CM

## 2016-06-04 DIAGNOSIS — I25119 Atherosclerotic heart disease of native coronary artery with unspecified angina pectoris: Secondary | ICD-10-CM | POA: Diagnosis not present

## 2016-06-04 DIAGNOSIS — I4819 Other persistent atrial fibrillation: Secondary | ICD-10-CM

## 2016-06-04 MED ORDER — METOPROLOL SUCCINATE ER 50 MG PO TB24: 50.0000 mg | ORAL_TABLET | Freq: Every day | ORAL | 3 refills | Status: DC

## 2016-06-04 MED ORDER — RIVAROXABAN 20 MG PO TABS: 20.0000 mg | ORAL_TABLET | Freq: Every day | ORAL | 0 refills | Status: DC

## 2016-06-04 NOTE — Addendum Note (Signed)
Addended by: Laurine Blazer on: 06/04/2016 08:50 AM   Modules accepted: Orders

## 2016-06-04 NOTE — Patient Instructions (Signed)
Medication Instructions:   Increase Toprol XL to 50mg  daily.  Continue all other medications.    Labwork: none  Testing/Procedures: none  Follow-Up: Your physician wants you to follow up in: 6 months.  You will receive a reminder letter in the mail one-two months in advance.  If you don't receive a letter, please call our office to schedule the follow up appointment   Any Other Special Instructions Will Be Listed Below (If Applicable).  If you need a refill on your cardiac medications before your next appointment, please call your pharmacy.

## 2016-06-15 DIAGNOSIS — K210 Gastro-esophageal reflux disease with esophagitis: Secondary | ICD-10-CM | POA: Diagnosis not present

## 2016-06-15 DIAGNOSIS — R944 Abnormal results of kidney function studies: Secondary | ICD-10-CM | POA: Diagnosis not present

## 2016-07-07 DIAGNOSIS — L570 Actinic keratosis: Secondary | ICD-10-CM | POA: Diagnosis not present

## 2016-09-14 DIAGNOSIS — R944 Abnormal results of kidney function studies: Secondary | ICD-10-CM | POA: Diagnosis not present

## 2016-09-14 DIAGNOSIS — I10 Essential (primary) hypertension: Secondary | ICD-10-CM | POA: Diagnosis not present

## 2016-09-14 DIAGNOSIS — E7800 Pure hypercholesterolemia, unspecified: Secondary | ICD-10-CM | POA: Diagnosis not present

## 2016-09-14 DIAGNOSIS — E1165 Type 2 diabetes mellitus with hyperglycemia: Secondary | ICD-10-CM | POA: Diagnosis not present

## 2016-09-14 DIAGNOSIS — K210 Gastro-esophageal reflux disease with esophagitis: Secondary | ICD-10-CM | POA: Diagnosis not present

## 2016-09-20 DIAGNOSIS — Z6833 Body mass index (BMI) 33.0-33.9, adult: Secondary | ICD-10-CM | POA: Diagnosis not present

## 2016-09-20 DIAGNOSIS — M7061 Trochanteric bursitis, right hip: Secondary | ICD-10-CM | POA: Diagnosis not present

## 2016-09-20 DIAGNOSIS — I259 Chronic ischemic heart disease, unspecified: Secondary | ICD-10-CM | POA: Diagnosis not present

## 2016-09-20 DIAGNOSIS — I10 Essential (primary) hypertension: Secondary | ICD-10-CM | POA: Diagnosis not present

## 2016-09-20 DIAGNOSIS — I482 Chronic atrial fibrillation: Secondary | ICD-10-CM | POA: Diagnosis not present

## 2016-09-20 DIAGNOSIS — E1165 Type 2 diabetes mellitus with hyperglycemia: Secondary | ICD-10-CM | POA: Diagnosis not present

## 2016-09-22 ENCOUNTER — Telehealth: Payer: Self-pay | Admitting: Cardiology

## 2016-09-22 MED ORDER — LOSARTAN POTASSIUM 25 MG PO TABS: 25.0000 mg | ORAL_TABLET | Freq: Every day | ORAL | 3 refills | Status: DC

## 2016-09-22 NOTE — Telephone Encounter (Signed)
Patient notified. New prescription sent in.

## 2016-09-22 NOTE — Telephone Encounter (Signed)
He is likely getting this filled per primary MD for hypertension. Switch would be to Losartan 25 mg daily.

## 2016-09-22 NOTE — Telephone Encounter (Signed)
Patient needs to know which medication he needs to switch to

## 2016-09-22 NOTE — Telephone Encounter (Signed)
Valsartan     Was told by his pharmacy to contact us

## 2016-10-04 DIAGNOSIS — M545 Low back pain: Secondary | ICD-10-CM | POA: Diagnosis not present

## 2016-10-04 DIAGNOSIS — M5416 Radiculopathy, lumbar region: Secondary | ICD-10-CM | POA: Diagnosis not present

## 2016-10-06 DIAGNOSIS — M545 Low back pain: Secondary | ICD-10-CM | POA: Diagnosis not present

## 2016-10-06 DIAGNOSIS — M5416 Radiculopathy, lumbar region: Secondary | ICD-10-CM | POA: Diagnosis not present

## 2016-10-11 DIAGNOSIS — M545 Low back pain: Secondary | ICD-10-CM | POA: Diagnosis not present

## 2016-10-11 DIAGNOSIS — M5416 Radiculopathy, lumbar region: Secondary | ICD-10-CM | POA: Diagnosis not present

## 2016-10-13 DIAGNOSIS — M545 Low back pain: Secondary | ICD-10-CM | POA: Diagnosis not present

## 2016-10-13 DIAGNOSIS — M5416 Radiculopathy, lumbar region: Secondary | ICD-10-CM | POA: Diagnosis not present

## 2016-10-18 DIAGNOSIS — M5416 Radiculopathy, lumbar region: Secondary | ICD-10-CM | POA: Diagnosis not present

## 2016-10-18 DIAGNOSIS — M545 Low back pain: Secondary | ICD-10-CM | POA: Diagnosis not present

## 2016-10-20 DIAGNOSIS — M545 Low back pain: Secondary | ICD-10-CM | POA: Diagnosis not present

## 2016-10-20 DIAGNOSIS — M5416 Radiculopathy, lumbar region: Secondary | ICD-10-CM | POA: Diagnosis not present

## 2016-10-26 DIAGNOSIS — H43393 Other vitreous opacities, bilateral: Secondary | ICD-10-CM | POA: Diagnosis not present

## 2016-10-26 DIAGNOSIS — H43391 Other vitreous opacities, right eye: Secondary | ICD-10-CM | POA: Diagnosis not present

## 2016-10-27 DIAGNOSIS — M545 Low back pain: Secondary | ICD-10-CM | POA: Diagnosis not present

## 2016-10-27 DIAGNOSIS — M5416 Radiculopathy, lumbar region: Secondary | ICD-10-CM | POA: Diagnosis not present

## 2016-10-29 DIAGNOSIS — M545 Low back pain: Secondary | ICD-10-CM | POA: Diagnosis not present

## 2016-10-29 DIAGNOSIS — M5416 Radiculopathy, lumbar region: Secondary | ICD-10-CM | POA: Diagnosis not present

## 2016-11-01 DIAGNOSIS — M545 Low back pain: Secondary | ICD-10-CM | POA: Diagnosis not present

## 2016-11-01 DIAGNOSIS — M5416 Radiculopathy, lumbar region: Secondary | ICD-10-CM | POA: Diagnosis not present

## 2016-11-03 DIAGNOSIS — M5416 Radiculopathy, lumbar region: Secondary | ICD-10-CM | POA: Diagnosis not present

## 2016-11-03 DIAGNOSIS — M545 Low back pain: Secondary | ICD-10-CM | POA: Diagnosis not present

## 2016-11-09 DIAGNOSIS — M545 Low back pain: Secondary | ICD-10-CM | POA: Diagnosis not present

## 2016-11-09 DIAGNOSIS — M5416 Radiculopathy, lumbar region: Secondary | ICD-10-CM | POA: Diagnosis not present

## 2016-11-12 DIAGNOSIS — M545 Low back pain: Secondary | ICD-10-CM | POA: Diagnosis not present

## 2016-11-12 DIAGNOSIS — M5416 Radiculopathy, lumbar region: Secondary | ICD-10-CM | POA: Diagnosis not present

## 2016-11-18 ENCOUNTER — Emergency Department (HOSPITAL_COMMUNITY)
Admission: EM | Admit: 2016-11-18 | Discharge: 2016-11-18 | Disposition: A | Discharge status: Home / Self Care | Payer: Medicare Other | Attending: Emergency Medicine | Admitting: Emergency Medicine

## 2016-11-18 ENCOUNTER — Telehealth: Payer: Self-pay | Admitting: Cardiology

## 2016-11-18 ENCOUNTER — Encounter (HOSPITAL_COMMUNITY): Payer: Self-pay | Admitting: *Deleted

## 2016-11-18 ENCOUNTER — Emergency Department (HOSPITAL_COMMUNITY): Payer: Medicare Other

## 2016-11-18 DIAGNOSIS — Z7984 Long term (current) use of oral hypoglycemic drugs: Secondary | ICD-10-CM | POA: Diagnosis not present

## 2016-11-18 DIAGNOSIS — R0602 Shortness of breath: Secondary | ICD-10-CM | POA: Insufficient documentation

## 2016-11-18 DIAGNOSIS — Z79899 Other long term (current) drug therapy: Secondary | ICD-10-CM | POA: Insufficient documentation

## 2016-11-18 DIAGNOSIS — Z87891 Personal history of nicotine dependence: Secondary | ICD-10-CM | POA: Diagnosis not present

## 2016-11-18 DIAGNOSIS — Z8673 Personal history of transient ischemic attack (TIA), and cerebral infarction without residual deficits: Secondary | ICD-10-CM | POA: Diagnosis not present

## 2016-11-18 DIAGNOSIS — R079 Chest pain, unspecified: Secondary | ICD-10-CM

## 2016-11-18 DIAGNOSIS — I119 Hypertensive heart disease without heart failure: Secondary | ICD-10-CM | POA: Insufficient documentation

## 2016-11-18 DIAGNOSIS — I25110 Atherosclerotic heart disease of native coronary artery with unstable angina pectoris: Secondary | ICD-10-CM | POA: Insufficient documentation

## 2016-11-18 DIAGNOSIS — Z7901 Long term (current) use of anticoagulants: Secondary | ICD-10-CM | POA: Insufficient documentation

## 2016-11-18 LAB — BASIC METABOLIC PANEL
ANION GAP: 10 (ref 5–15)
BUN: 19 mg/dL (ref 6–20)
CALCIUM: 9.2 mg/dL (ref 8.9–10.3)
CO2: 27 mmol/L (ref 22–32)
Chloride: 103 mmol/L (ref 101–111)
Creatinine, Ser: 1.08 mg/dL (ref 0.61–1.24)
GFR calc Af Amer: >60 mL/min (ref >60)
GLUCOSE: 174 mg/dL — AB (ref 65–99)
Potassium: 3.8 mmol/L (ref 3.5–5.1)
Sodium: 140 mmol/L (ref 135–145)

## 2016-11-18 LAB — CBC WITH DIFFERENTIAL/PLATELET
BASOS ABS: 0.1 10*3/uL (ref 0.0–0.1)
Basophils Relative: 1 %
Eosinophils Absolute: 0.4 10*3/uL (ref 0.0–0.7)
Eosinophils Relative: 5 %
HCT: 44 % (ref 39.0–52.0)
HEMOGLOBIN: 14.7 g/dL (ref 13.0–17.0)
LYMPHS PCT: 35 %
Lymphs Abs: 3.3 10*3/uL (ref 0.7–4.0)
MCH: 31.7 pg (ref 26.0–34.0)
MCHC: 33.4 g/dL (ref 30.0–36.0)
MCV: 94.8 fL (ref 78.0–100.0)
MONO ABS: 0.8 10*3/uL (ref 0.1–1.0)
MONOS PCT: 8 %
NEUTROS ABS: 4.8 10*3/uL (ref 1.7–7.7)
Neutrophils Relative %: 51 %
Platelets: 152 10*3/uL (ref 150–400)
RBC: 4.64 MIL/uL (ref 4.22–5.81)
RDW: 13.4 % (ref 11.5–15.5)
WBC: 9.4 10*3/uL (ref 4.0–10.5)

## 2016-11-18 LAB — I-STAT TROPONIN, ED: TROPONIN I, POC: 0.01 ng/mL (ref 0.00–0.08)

## 2016-11-18 NOTE — Telephone Encounter (Signed)
He has a pretty significant history of cardiac conditions, without further evaluation it is unclear which may be acting up and how serious it may be. I would recommend he be evaluated in the [facility]

## 2016-11-18 NOTE — Discharge Instructions (Signed)
Tests show no life-threatening condition. Follow-up your cardiologist. If symptoms worsen, return to the emergency department.

## 2016-11-18 NOTE — Telephone Encounter (Signed)
Patient notified and verbalized understanding. Patient going to [facility]

## 2016-11-18 NOTE — Telephone Encounter (Signed)
Patient states when he lays down he has a tightness in chest. Patient states when he gets up he is very short of breath and cannot do any ADL's. Patient reports HR 93 BP 128/78. Patient reports no chest pain just tightness in chest. No new medications and no other symptoms. Sending to DOD due to Dr. Domenic Polite being out of office

## 2016-11-18 NOTE — ED Triage Notes (Signed)
Shortness of breath for a week °

## 2016-11-18 NOTE — Telephone Encounter (Signed)
Has experiencing SOB along with AFIB episodes.  He just wants to check with [place]. He just has a funny feeling.

## 2016-11-20 NOTE — ED Provider Notes (Signed)
[facility] DEPT Provider Note   CSN: [id number] Arrival date & time: 11/18/16  1640     History   Chief Complaint Chief Complaint  Patient presents with  . Chest Pain  . Shortness of Breath    HPI Richard [name] is a 83 y.o. male.  Patient complains of dyspnea and intermittent chest pain for several days. He has a history of atrial fibrillation and MI. He did complain of tightness last night in his chest. He is feeling better now. Severity of symptoms mild to moderate. Exertion makes symptoms worse, but this is not new.      Past Medical History:  Diagnosis Date  . CAD (coronary artery disease)    Mild-moderate LM with occluded RCA (collaterals) - managed medically 2013  . GERD (gastroesophageal reflux disease)   . Hypertensive heart disease   . Obesity (BMI 30.0-34.9)   . Old inferior myocardial infarction 1995  . PAF (paroxysmal atrial fibrillation) ([place])   . Pulmonary embolus [facility])    [name] 2015    Patient Active Problem List   Diagnosis Date Noted  . Unstable angina ([place]) 01/21/2012  . Hypertensive heart disease   . GERD (gastroesophageal reflux disease)   . Obesity (BMI 30.0-34.9)   . CAD (coronary artery disease)   . Old inferior myocardial infarction   . Hyperlipidemia     Past Surgical History:  Procedure Laterality Date  . ACHILLES TENDON REPAIR    . APPENDECTOMY    . LEFT HEART CATHETERIZATION WITH CORONARY ANGIOGRAM N/A 01/24/2012   Procedure: LEFT HEART CATHETERIZATION WITH CORONARY ANGIOGRAM;  Surgeon: Jacolyn Reedy, MD;  Location: [facility] CATH LAB;  Service: Cardiovascular;  Laterality: N/A;  . SHOULDER SURGERY         Home Medications    Prior to Admission medications   Medication Sig Start Date End Date Taking? Authorizing Provider  atorvastatin (LIPITOR) 10 MG tablet Take 10 mg by mouth every evening.   Yes [provider]  diltiazem (CARDIZEM CD) 240 MG 24 hr capsule Take 240 mg by mouth daily.   Yes [provider]  losartan (COZAAR) 25 MG tablet Take 1 tablet (25 mg total) by mouth daily. 09/22/16 12/21/16 Yes Satira Sark, MD  metFORMIN (GLUCOPHAGE-XR) 500 MG 24 hr tablet Take 500 mg by mouth 2 (two) times daily.    Yes [provider]  metoprolol succinate (TOPROL-XL) 50 MG 24 hr tablet Take 1 tablet (50 mg total) by mouth daily. 06/04/16  Yes Satira Sark, MD  rivaroxaban (XARELTO) 20 MG TABS tablet Take 1 tablet (20 mg total) by mouth daily with supper. Patient taking differently: Take 20 mg by mouth every morning.  06/04/16  Yes Satira Sark, MD  nitroGLYCERIN (NITROSTAT) 0.4 MG SL tablet Place 1 tablet (0.4 mg total) under the tongue every 5 (five) minutes x 3 doses as needed for chest pain. 01/24/12   Jacolyn Reedy, MD    Family History Family History  Problem Relation Age of Onset  . Heart failure Mother     Social History Social History  Substance Use Topics  . Smoking status: Former Smoker    Packs/day: 0.25    Quit date: 03/08/1993  . Smokeless tobacco: Never Used  . Alcohol use No     Allergies   Niaspan [niacin er]   Review of Systems Review of Systems  All other systems reviewed and are negative.    Physical Exam Updated Vital Signs BP 110/81  Pulse 73   Temp 98.6 F (37 C) (Oral)   Resp (!) 21   Ht 5\' 8"  (1.727 m)   Wt 95.3 kg (210 lb)   SpO2 94%   BMI 31.93 kg/m   Physical Exam  Constitutional: He is oriented to person, place, and time. He appears well-developed and well-nourished.  HENT:  Head: Normocephalic and atraumatic.  Eyes: Conjunctivae are normal.  Neck: Neck supple.  Cardiovascular: Normal rate.   Irregularly irregular  Pulmonary/Chest: Effort normal and breath sounds normal.  Abdominal: Soft. Bowel sounds are normal.  Musculoskeletal: Normal range of motion.  Neurological: He is alert and oriented to person, place, and time.  Skin: Skin is warm and dry.  Psychiatric: He has a normal mood and  affect. His behavior is normal.  Nursing note and vitals reviewed.    ED Treatments / Results  Labs (all labs ordered are listed, but only abnormal results are displayed) Labs Reviewed  BASIC METABOLIC PANEL - Abnormal; Notable for the following:       Result Value   Glucose, Bld 174 (*)    All other components within normal limits  CBC WITH DIFFERENTIAL/PLATELET  I-STAT TROPONIN, ED    EKG  EKG Interpretation  Date/Time:  Thursday November 18 2016 16:53:01 EDT Ventricular Rate:  85 PR Interval:    QRS Duration: 103 QT Interval:  336 QTC Calculation: 400 R Axis:   83 Text Interpretation:  Atrial fibrillation Borderline right axis deviation Borderline T abnormalities, inferior leads Baseline wander in lead(s) V5 Confirmed by Nat Christen [phone]) on 11/18/2016 5:20:11 PM       Radiology Dg Chest 2 View  Result Date: 11/18/2016 CLINICAL DATA:  Chest pain EXAM: CHEST  2 VIEW COMPARISON:  03/12/2016 chest x-ray. 03/16/2016 chest CT is not available for review FINDINGS: Stable mild cardiomegaly and aortic tortuosity. Stable low lung volumes with scar-like appearance. There is no edema, consolidation, effusion, or pneumothorax. Spondylosis. No acute osseous finding. IMPRESSION: 1. No acute finding when compared to prior. 2. Cardiomegaly and mild pulmonary scarring. Electronically Signed   By: Monte Fantasia M.D.   On: 11/18/2016 18:24    Procedures Procedures (including critical care time)  Medications Ordered in ED Medications - No data to display   Initial Impression / Assessment and Plan / ED Course  I have reviewed the triage vital signs and the nursing notes.  Pertinent labs & imaging results that were available during my care of the patient were reviewed by me and considered in my medical decision making (see chart for details).     Patient is hemodynamically stable. Discussed clinical findings with the patient and his wife and son. He'll be discharged home with  close follow-up with cardiology.  Final Clinical Impressions(s) / ED Diagnoses   Final diagnoses:  Chest pain, unspecified type    New Prescriptions Discharge Medication List as of 11/18/2016  9:24 PM       Nat Christen, MD 11/20/16 1147

## 2016-12-01 ENCOUNTER — Ambulatory Visit: Payer: Medicare Other | Admitting: Cardiology

## 2016-12-01 NOTE — Progress Notes (Signed)
Cardiology Office Note  Date: 12/02/2016   ID: Richard [name], Richard [name] 1934-03-03, MRN [id number]  PCP: Manon Hilding, MD  Primary Cardiologist: Rozann Lesches, MD   Chief Complaint  Patient presents with  . Atrial Fibrillation    History of Present Illness: Richard [name] is an 83 y.o. male last seen in March. Records reviewed, he recently called the office reporting shortness of breath and chest discomfort. He was referred to the ER by Dr. Harl Bowie. He was assessed by Dr. Lacinda Axon, found to have normal troponin I level, ECG showed rate-controlled atrial fibrillation without acute ST segment changes. Chest x-ray showed no acute findings with mild pulmonary scarring and cardiomegaly. He was discharged home to follow-up with me.  He presents today with his wife for follow-up. He has had no recurrent episodes of chest pain but still has dyspnea on exertion and feels like his heart rate is up at times. He reports compliance with his medications including Cardizem CD and Toprol-XL. He has noticed these symptoms over the last few weeks to a month. Also admits to being under a lot of stress with other family members having health problems.  Ischemic testing from last year demonstrated region of inferior/inferolateral scar with peri-infarct ischemia.  Past Medical History:  Diagnosis Date  . CAD (coronary artery disease)    Mild-moderate LM with occluded RCA (collaterals) - managed medically 2013  . GERD (gastroesophageal reflux disease)   . Hypertensive heart disease   . Obesity (BMI 30.0-34.9)   . Old inferior myocardial infarction 1995  . PAF (paroxysmal atrial fibrillation) ([place])   . Pulmonary embolus ([place])    [name] 2015    Past Surgical History:  Procedure Laterality Date  . ACHILLES TENDON REPAIR    . APPENDECTOMY    . LEFT HEART CATHETERIZATION WITH CORONARY ANGIOGRAM N/A 01/24/2012   Procedure: LEFT HEART CATHETERIZATION WITH CORONARY ANGIOGRAM;  Surgeon: Jacolyn Reedy, MD;  Location: [facility] CATH LAB;  Service: Cardiovascular;  Laterality: N/A;  . SHOULDER SURGERY      Current Outpatient Prescriptions  Medication Sig Dispense Refill  . atorvastatin (LIPITOR) 10 MG tablet Take 10 mg by mouth every evening.    . diltiazem (CARDIZEM CD) 240 MG 24 hr capsule Take 240 mg by mouth daily.    Marland Kitchen losartan (COZAAR) 25 MG tablet Take 1 tablet (25 mg total) by mouth daily. 90 tablet 3  . metFORMIN (GLUCOPHAGE-XR) 500 MG 24 hr tablet Take 500 mg by mouth 2 (two) times daily.     . metoprolol succinate (TOPROL-XL) 50 MG 24 hr tablet Take 1 1/2 tabs (43m) by mouth daily. 45 tablet 6  . nitroGLYCERIN (NITROSTAT) 0.4 MG SL tablet Place 1 tablet (0.4 mg total) under the tongue every 5 (five) minutes x 3 doses as needed for chest pain. 25 tablet 12  . rivaroxaban (XARELTO) 20 MG TABS tablet Take 1 tablet (20 mg total) by mouth daily with supper. (Patient taking differently: Take 20 mg by mouth every morning. ) 21 tablet 0  . isosorbide mononitrate (IMDUR) 30 MG 24 hr tablet Take 1 tablet (30 mg total) by mouth every evening. 30 tablet 6   No current facility-administered medications for this visit.    Allergies:  Niaspan [niacin er]   Social History: The patient  reports that he quit smoking about 23 years ago. He smoked 0.25 packs per day. He has never used smokeless tobacco. He reports that he does not drink alcohol or use drugs.  ROS:  Please see the history of present illness. Otherwise, complete review of systems is positive for hearing loss.  All other systems are reviewed and negative.   Physical Exam: VS:  BP (!) 148/98   Pulse 97   Ht _0  (1.727 m)   Wt 215 lb (97.5 kg)   SpO2 96%   BMI 32.69 kg/m , BMI Body mass index is 32.69 kg/m.  Wt Readings from Last 3 Encounters:  12/02/16 215 lb (97.5 kg)  11/18/16 210 lb (95.3 kg)  06/04/16 212 lb 3.2 oz (96.3 kg)    General: Obese elderly male, appears comfortable at rest. HEENT: Conjunctiva and lids  normal, oropharynx clear. Neck: Supple, no elevated JVP or carotid bruits, no thyromegaly. Lungs: Decreased breath sounds, nonlabored breathing at rest. Cardiac: Irregularly irregular, no S3, 1/6 systolic murmur, no pericardial rub. Abdomen: Soft, nontender, bowel sounds present, no guarding or rebound. Extremities: No pitting edema, distal pulses 2+. Skin: Warm and dry. Musculoskeletal: No kyphosis. Neuropsychiatric: Alert and oriented x3, affect grossly appropriate.  ECG: I personally reviewed the tracing from9/13/2018 which showed rate-controlled atrial fibrillation with nonspecific ST-T changes.  Recent Labwork: 11/18/2016: BUN 19; Creatinine, Ser 1.08; Hemoglobin 14.7; Platelets 152; Potassium 3.8; Sodium 140   Other Studies Reviewed Today:  Echocardiogram 11/26/2015: Study Conclusions  - Left ventricle: The cavity size was normal. Wall thickness was   normal. Systolic function was normal. The estimated ejection   fraction was in the range of 55% to 60%. Although no diagnostic   regional wall motion abnormality was identified, this possibility   cannot be completely excluded on the basis of this study. The   study is not technically sufficient to allow evaluation of LV   diastolic function. - Aortic valve: Mildly calcified annulus. Trileaflet; mildly   calcified leaflets. There was trivial regurgitation. - Aortic root: The aortic root was mildly dilated. - Mitral valve: Calcified annulus. There was trivial regurgitation. - Left atrium: The atrium was moderately dilated. - Right ventricle: The cavity size was mildly dilated. Systolic   function was mildly reduced. - Right atrium: The atrium was mildly dilated. Central venous   pressure (est): 3 mm Hg. - Tricuspid valve: There was mild regurgitation. - Pulmonary arteries: PA peak pressure: 23 mm Hg (S). - Pericardium, extracardiac: There was no pericardial effusion.  Impressions:  - Normal LV wall thickness with LVEF  55-60%. Indeterminate   diastolic function in the setting of atrial fibrillation.   Moderate left atrial enlargement. MAC with trivial mitral   regurgitation. Mildly dilated aortic root. Sclerotic aortic valve   with trivial aortic regurgitation. Mildly dilated RV with mildly   reduced contraction. Mild tricuspid regurgitation with PASP 23   mmHg.  Lexiscan Myoview 11/19/2015:  There was no ST segment deviation noted during stress.  Defect 1: There is a medium defect of mild severity present in the basal inferoseptal, basal inferior, basal inferolateral, mid inferoseptal, mid inferior and mid inferolateral location.  Findings consistent with prior myocardial infarction with peri-infarct ischemia.  This is an intermediate risk study.  Nuclear stress EF: 45%.  Chest x-ray 11/18/2016: FINDINGS: Stable mild cardiomegaly and aortic tortuosity. Stable low lung volumes with scar-like appearance. There is no edema, consolidation, effusion, or pneumothorax. Spondylosis. No acute osseous finding.  IMPRESSION: 1. No acute finding when compared to prior. 2. Cardiomegaly and mild pulmonary scarring.  Assessment and Plan:  1. Dyspnea on exertion and intermittent chest discomfort consistent with angina. Patient has known ischemic heart disease and also  atrial fibrillation. He describes a sense of palpitations and his heart rate is not optimally controlled on current regimen which could also be contributing to his angina. We have discussed further evaluation and treatment options. Do not plan to pursue follow-up stress test now. We will increase Toprol-XL to 75 mg daily and add Imdur 30 mg once in the evening. Office follow-up arranged. If symptoms do not improve, cardiac catheterization will be pursued to assess for revascularization options.  2. CAD with history of mild to moderate left main disease and occluded RCA with collaterals as of 2013.  3. Persistent atrial fibrillation. Increasing  Toprol-XL as outlined, otherwise continue Xarelto for stroke prophylaxis. Recent lab was reviewed.  4. Essential hypertension, adjustments in medical regimen being made as outlined above.  Current medicines were reviewed with the patient today.  Disposition: Follow-up in one month.  Signed, Satira Sark, MD, [facility] 12/02/2016 9:03 AM    [facility] [place], [place], [place] [address] Phone: [phone]; Fax: [phone]

## 2016-12-02 ENCOUNTER — Ambulatory Visit (INDEPENDENT_AMBULATORY_CARE_PROVIDER_SITE_OTHER): Payer: Medicare Other | Admitting: Cardiology

## 2016-12-02 ENCOUNTER — Encounter: Payer: Self-pay | Admitting: Cardiology

## 2016-12-02 VITALS — BP 148/98 | HR 97 | Ht 68.0 in | Wt 215.0 lb

## 2016-12-02 DIAGNOSIS — I10 Essential (primary) hypertension: Secondary | ICD-10-CM

## 2016-12-02 DIAGNOSIS — I25119 Atherosclerotic heart disease of native coronary artery with unspecified angina pectoris: Secondary | ICD-10-CM

## 2016-12-02 DIAGNOSIS — I481 Persistent atrial fibrillation: Secondary | ICD-10-CM

## 2016-12-02 DIAGNOSIS — I209 Angina pectoris, unspecified: Secondary | ICD-10-CM | POA: Diagnosis not present

## 2016-12-02 DIAGNOSIS — I4819 Other persistent atrial fibrillation: Secondary | ICD-10-CM

## 2016-12-02 DIAGNOSIS — R0609 Other forms of dyspnea: Secondary | ICD-10-CM

## 2016-12-02 MED ORDER — METOPROLOL SUCCINATE ER 50 MG PO TB24: ORAL_TABLET | ORAL | 6 refills | Status: DC

## 2016-12-02 MED ORDER — ISOSORBIDE MONONITRATE ER 30 MG PO TB24: 30.0000 mg | ORAL_TABLET | Freq: Every evening | ORAL | 6 refills | Status: DC

## 2016-12-02 NOTE — Patient Instructions (Addendum)
Medication Instructions:   Begin Imdur 30mg  every evening.  Increase Toprol XL to 75mg  daily.  Continue all other medications.    Labwork: none  Testing/Procedures: none  Follow-Up: 1 month   Any Other Special Instructions Will Be Listed Below (If Applicable).  If you need a refill on your cardiac medications before your next appointment, please call your pharmacy.

## 2016-12-19 ENCOUNTER — Emergency Department (HOSPITAL_COMMUNITY)
Admission: EM | Admit: 2016-12-19 | Discharge: 2016-12-19 | Disposition: A | Discharge status: Home / Self Care | Payer: Medicare Other | Attending: Emergency Medicine | Admitting: Emergency Medicine

## 2016-12-19 ENCOUNTER — Emergency Department (HOSPITAL_COMMUNITY): Payer: Medicare Other

## 2016-12-19 ENCOUNTER — Encounter (HOSPITAL_COMMUNITY): Payer: Self-pay | Admitting: *Deleted

## 2016-12-19 DIAGNOSIS — Y929 Unspecified place or not applicable: Secondary | ICD-10-CM | POA: Insufficient documentation

## 2016-12-19 DIAGNOSIS — T148XXA Other injury of unspecified body region, initial encounter: Secondary | ICD-10-CM | POA: Diagnosis not present

## 2016-12-19 DIAGNOSIS — Y939 Activity, unspecified: Secondary | ICD-10-CM | POA: Diagnosis not present

## 2016-12-19 DIAGNOSIS — S7001XA Contusion of right hip, initial encounter: Secondary | ICD-10-CM | POA: Diagnosis not present

## 2016-12-19 DIAGNOSIS — I10 Essential (primary) hypertension: Secondary | ICD-10-CM | POA: Insufficient documentation

## 2016-12-19 DIAGNOSIS — I252 Old myocardial infarction: Secondary | ICD-10-CM | POA: Diagnosis not present

## 2016-12-19 DIAGNOSIS — I2510 Atherosclerotic heart disease of native coronary artery without angina pectoris: Secondary | ICD-10-CM | POA: Diagnosis not present

## 2016-12-19 DIAGNOSIS — Z79899 Other long term (current) drug therapy: Secondary | ICD-10-CM | POA: Diagnosis not present

## 2016-12-19 DIAGNOSIS — M7061 Trochanteric bursitis, right hip: Secondary | ICD-10-CM | POA: Insufficient documentation

## 2016-12-19 DIAGNOSIS — M25551 Pain in right hip: Secondary | ICD-10-CM | POA: Diagnosis not present

## 2016-12-19 DIAGNOSIS — Y33XXXA Other specified events, undetermined intent, initial encounter: Secondary | ICD-10-CM | POA: Insufficient documentation

## 2016-12-19 DIAGNOSIS — Z7984 Long term (current) use of oral hypoglycemic drugs: Secondary | ICD-10-CM | POA: Diagnosis not present

## 2016-12-19 DIAGNOSIS — Z87891 Personal history of nicotine dependence: Secondary | ICD-10-CM | POA: Diagnosis not present

## 2016-12-19 DIAGNOSIS — Y999 Unspecified external cause status: Secondary | ICD-10-CM | POA: Diagnosis not present

## 2016-12-19 LAB — CBG MONITORING, ED: GLUCOSE-CAPILLARY: 157 mg/dL — AB (ref 65–99)

## 2016-12-19 MED ORDER — HYDROCODONE-ACETAMINOPHEN 5-325 MG PO TABS: 1.0000 | ORAL_TABLET | ORAL | 0 refills | Status: DC | PRN

## 2016-12-19 MED ORDER — LIDOCAINE HCL (PF) 1 % IJ SOLN
30.0000 mL | Freq: Once | INTRAMUSCULAR | Status: DC
  Filled 2016-12-19: qty 30

## 2016-12-19 MED ORDER — HYDROMORPHONE HCL 1 MG/ML IJ SOLN
0.5000 mg | Freq: Once | INTRAMUSCULAR | Status: AC
  Administered 2016-12-19: 0.5 mg via INTRAVENOUS
  Filled 2016-12-19: qty 1

## 2016-12-19 MED ORDER — METHYLPREDNISOLONE ACETATE 80 MG/ML IJ SUSP
40.0000 mg | Freq: Once | INTRAMUSCULAR | Status: DC
  Filled 2016-12-19: qty 1

## 2016-12-19 MED ORDER — ONDANSETRON HCL 4 MG/2ML IJ SOLN
4.0000 mg | Freq: Once | INTRAMUSCULAR | Status: AC
  Administered 2016-12-19: 4 mg via INTRAVENOUS
  Filled 2016-12-19: qty 2

## 2016-12-19 MED ORDER — LIDOCAINE HCL (PF) 1 % IJ SOLN
INTRAMUSCULAR | Status: AC
  Administered 2016-12-19: 10 mL
  Filled 2016-12-19: qty 10

## 2016-12-19 NOTE — ED Notes (Signed)
Pt alert & oriented x4, stable gait. Patient given discharge instructions, paperwork & prescription(s). Patient verbalized understanding. Pt left department in wheelchair escorted by staff. Pt left w/ no further questions. 

## 2016-12-19 NOTE — ED Triage Notes (Signed)
Pt c/o right hip pain that started yesterday, denies any injury, pain is worse with movement,

## 2016-12-19 NOTE — ED Provider Notes (Signed)
[facility] DEPT Provider Note   CSN: [id number] Arrival date & time: 12/19/16  0143     History   Chief Complaint Chief Complaint  Patient presents with  . Hip Pain    HPI DAROL CUSH is a 83 y.o. male.  Patient presents to the ER for evaluation of hip pain. Patient reports that he started having pain on the outside portion of his right hip earlier today. Pain has progressively worsened, now feels like a "toothache". He reports that the pain worsens if he tries to walk, cannot lift his leg because of pain. He denies any injury.      Past Medical History:  Diagnosis Date  . CAD (coronary artery disease)    Mild-moderate LM with occluded RCA (collaterals) - managed medically 2013  . GERD (gastroesophageal reflux disease)   . Hypertensive heart disease   . Obesity (BMI 30.0-34.9)   . Old inferior myocardial infarction 1995  . PAF (paroxysmal atrial fibrillation) ([place])   . Pulmonary embolus [facility])    [name] 2015    Patient Active Problem List   Diagnosis Date Noted  . Unstable angina ([place]) 01/21/2012  . Hypertensive heart disease   . GERD (gastroesophageal reflux disease)   . Obesity (BMI 30.0-34.9)   . CAD (coronary artery disease)   . Old inferior myocardial infarction   . Hyperlipidemia     Past Surgical History:  Procedure Laterality Date  . ACHILLES TENDON REPAIR    . APPENDECTOMY    . LEFT HEART CATHETERIZATION WITH CORONARY ANGIOGRAM N/A 01/24/2012   Procedure: LEFT HEART CATHETERIZATION WITH CORONARY ANGIOGRAM;  Surgeon: Jacolyn Reedy, MD;  [facility];  Service: Cardiovascular;  Laterality: N/A;  . SHOULDER SURGERY         Home Medications    Prior to Admission medications   Medication Sig Start Date End Date Taking? Authorizing Provider  atorvastatin (LIPITOR) 10 MG tablet Take 10 mg by mouth every evening.   Yes [provider]  diltiazem (CARDIZEM CD) 240 MG 24 hr capsule Take 240 mg by mouth daily.   Yes  [provider]  isosorbide mononitrate (IMDUR) 30 MG 24 hr tablet Take 1 tablet (30 mg total) by mouth every evening. 12/02/16 03/02/17 Yes Satira Sark, MD  metFORMIN (GLUCOPHAGE-XR) 500 MG 24 hr tablet Take 500 mg by mouth 2 (two) times daily.    Yes [provider]  metoprolol succinate (TOPROL-XL) 50 MG 24 hr tablet Take 1 1/2 tabs (75mg ) by mouth daily. 12/02/16  Yes Satira Sark, MD  rivaroxaban (XARELTO) 20 MG TABS tablet Take 1 tablet (20 mg total) by mouth daily with supper. Patient taking differently: Take 20 mg by mouth every morning.  06/04/16  Yes Satira Sark, MD  HYDROcodone-acetaminophen (NORCO/VICODIN) 5-325 MG tablet Take 1 tablet by mouth every 4 (four) hours as needed for moderate pain. 12/19/16   Orpah Greek, MD  losartan (COZAAR) 25 MG tablet Take 1 tablet (25 mg total) by mouth daily. 09/22/16 12/21/16  Satira Sark, MD  nitroGLYCERIN (NITROSTAT) 0.4 MG SL tablet Place 1 tablet (0.4 mg total) under the tongue every 5 (five) minutes x 3 doses as needed for chest pain. 01/24/12   Jacolyn Reedy, MD    Family History Family History  Problem Relation Age of Onset  . Heart failure Mother     Social History Social History  Substance Use Topics  . Smoking status: Former Smoker    Packs/day: 0.25  Quit date: 03/08/1993  . Smokeless tobacco: Never Used  . Alcohol use No     Allergies   Niaspan [niacin er]   Review of Systems Review of Systems  Musculoskeletal: Positive for arthralgias.  All other systems reviewed and are negative.    Physical Exam Updated Vital Signs BP 117/83   Pulse 88   Temp 98.2 F (36.8 C) (Oral)   Resp 20   Ht 5\' 8"  (1.727 m)   Wt 99.8 kg (220 lb)   SpO2 97%   BMI 33.45 kg/m   Physical Exam  Constitutional: He is oriented to person, place, and time. He appears well-developed and well-nourished. No distress.  HENT:  Head: Normocephalic and atraumatic.  Right Ear: Hearing  normal.  Left Ear: Hearing normal.  Nose: Nose normal.  Mouth/Throat: Oropharynx is clear and moist and mucous membranes are normal.  Eyes: Pupils are equal, round, and reactive to light. Conjunctivae and EOM are normal.  Neck: Normal range of motion. Neck supple.  Cardiovascular: Regular rhythm, S1 normal and S2 normal.  Exam reveals no gallop and no friction rub.   No murmur heard. Pulmonary/Chest: Effort normal and breath sounds normal. No respiratory distress. He exhibits no tenderness.  Abdominal: Soft. Normal appearance and bowel sounds are normal. There is no hepatosplenomegaly. There is no tenderness. There is no rebound, no guarding, no tenderness at McBurney's point and negative Murphy's sign. No hernia.  Musculoskeletal:       Right shoulder: He exhibits decreased range of motion and tenderness.  Neurological: He is alert and oriented to person, place, and time. He has normal strength. No cranial nerve deficit or sensory deficit. Coordination normal. GCS eye subscore is 4. GCS verbal subscore is 5. GCS motor subscore is 6.  Skin: Skin is warm, dry and intact. No rash noted. No cyanosis.  Psychiatric: He has a normal mood and affect. His speech is normal and behavior is normal. Thought content normal.  Nursing note and vitals reviewed.    ED Treatments / Results  Labs (all labs ordered are listed, but only abnormal results are displayed) Labs Reviewed  CBG MONITORING, ED - Abnormal; Notable for the following:       Result Value   Glucose-Capillary 157 (*)    All other components within normal limits    EKG  EKG Interpretation None       Radiology Dg Hip Unilat W Or Wo Pelvis 2-3 Views Right  Result Date: 12/19/2016 CLINICAL DATA:  Right hip pain, nontraumatic.  Onset yesterday. EXAM: DG HIP (WITH OR WITHOUT PELVIS) 2-3V RIGHT COMPARISON:  None. FINDINGS: Negative for fracture or dislocation. No significant arthritic changes at the hip. No bone lesion or bony  destruction. Pubic symphysis and sacroiliac joints are intact. There is lower lumbar degenerative change. IMPRESSION: No significant abnormality of the right hip.  No acute findings. Electronically Signed   By: Andreas Newport M.D.   On: 12/19/2016 02:49    Procedures Procedures (including critical care time)  Medications Ordered in ED Medications  lidocaine (PF) (XYLOCAINE) 1 % injection 30 mL (not administered)  methylPREDNISolone acetate (DEPO-MEDROL) injection 40 mg (40 mg Intra-Lesional Not Given 12/19/16 0439)  HYDROmorphone (DILAUDID) injection 0.5 mg (not administered)  ondansetron (ZOFRAN) injection 4 mg (4 mg Intravenous Given 12/19/16 0216)  HYDROmorphone (DILAUDID) injection 0.5 mg (0.5 mg Intravenous Given 12/19/16 0217)  lidocaine (PF) (XYLOCAINE) 1 % injection (10 mLs  Given by Other 12/19/16 0441)     Initial Impression / Assessment  and Plan / ED Course  I have reviewed the triage vital signs and the nursing notes.  Pertinent labs & imaging results that were available during my care of the patient were reviewed by me and considered in my medical decision making (see chart for details).     Patient presents to the emergency department for evaluation of right hip pain. Patient is complaining of pain in the area of the right trochanteric bursa and just posterior to it that started yesterday. He denies any direct injury. He does takes Xarelto. No overlying skin changes to suggest infection. There was point tenderness with slight ballotable ability of the fullness over the posterior aspect of the trochanteric bursa. I recommended injection of this area for pain relief. Risks and benefits were discussed. During infiltration with lidocaine, I was incrementally advanced the needle with aspiration. I did get a sudden return of blood. Approximately 5 mL's of blood was evacuated from the area,likely spontaneous hematoma formation. Area was therefore not injected with Depo-Medrol.  Immediately after the seizure he had some relief was able to stand and walk. He was provided additional analgesia and will discharge to home, analgesia, rest, warm compresses, follow up with PCP.  Final Clinical Impressions(s) / ED Diagnoses   Final diagnoses:  Trochanteric bursitis of right hip  Hematoma    New Prescriptions New Prescriptions   HYDROCODONE-ACETAMINOPHEN (NORCO/VICODIN) 5-325 MG TABLET    Take 1 tablet by mouth every 4 (four) hours as needed for moderate pain.     Orpah Greek, MD 12/19/16 0500

## 2016-12-22 DIAGNOSIS — S7001XA Contusion of right hip, initial encounter: Secondary | ICD-10-CM | POA: Diagnosis not present

## 2016-12-22 DIAGNOSIS — Z6834 Body mass index (BMI) 34.0-34.9, adult: Secondary | ICD-10-CM | POA: Diagnosis not present

## 2016-12-22 DIAGNOSIS — M25551 Pain in right hip: Secondary | ICD-10-CM | POA: Diagnosis not present

## 2016-12-23 NOTE — Progress Notes (Signed)
Cardiology Office Note  Date: 12/24/2016   ID: [name], [name] November 22, 1933, MRN [id number]  PCP: Manon Hilding, MD  Primary Cardiologist: Rozann Lesches, MD   Chief Complaint  Patient presents with  . Coronary Artery Disease    History of Present Illness: Richard [name] is an 83 y.o. male last seen in September. He presents today with his wife for a follow-up visit. He states that his dyspnea on exertion and fatigue is no better since we adjusted medications. At the last visit we increased Toprol XL to 75 mg daily and added Imdur 30 mg daily. He came in today for [place] to discuss the possibility of a cardiac catheterization.  Of note however, he was just recently seen in the ER on Saturday. He states that he woke up at night with significant right hip pain, had difficulty even standing with his right leg. There was concern that he might need a joint injection but he was found to have a hematoma in his right gluteal region, has subsequently had tracking of blood down the back of his right leg. He does not recall any injury.  Medications include Xarelto. He has had no spontaneous bleeding on this medication before. CBC was not obtained recently. His wife states that he has mentioned some of his stools have been dark.  Past Medical History:  Diagnosis Date  . CAD (coronary artery disease)    Mild-moderate LM with occluded RCA (collaterals) - managed medically 2013  . GERD (gastroesophageal reflux disease)   . Hypertensive heart disease   . Obesity (BMI 30.0-34.9)   . Old inferior myocardial infarction 1995  . PAF (paroxysmal atrial fibrillation) ([facility])   . Pulmonary embolus ([place])    [name] 2015    Past Surgical History:  Procedure Laterality Date  . ACHILLES TENDON REPAIR    . APPENDECTOMY    . LEFT HEART CATHETERIZATION WITH CORONARY ANGIOGRAM N/A 01/24/2012   Procedure: LEFT HEART CATHETERIZATION WITH CORONARY ANGIOGRAM;  Surgeon: Jacolyn Reedy, MD;   Location: [facility] CATH LAB;  Service: Cardiovascular;  Laterality: N/A;  . SHOULDER SURGERY      Current Outpatient Prescriptions  Medication Sig Dispense Refill  . atorvastatin (LIPITOR) 10 MG tablet Take 10 mg by mouth every evening.    . diltiazem (CARDIZEM CD) 240 MG 24 hr capsule Take 240 mg by mouth daily.    Marland Kitchen HYDROcodone-acetaminophen (NORCO/VICODIN) 5-325 MG tablet Take 1 tablet by mouth every 4 (four) hours as needed for moderate pain. 10 tablet 0  . isosorbide mononitrate (IMDUR) 30 MG 24 hr tablet Take 1 tablet (30 mg total) by mouth every evening. 30 tablet 6  . losartan (COZAAR) 25 MG tablet Take 25 mg by mouth daily.    . metFORMIN (GLUCOPHAGE-XR) 500 MG 24 hr tablet Take 500 mg by mouth 2 (two) times daily.     . metoprolol succinate (TOPROL-XL) 50 MG 24 hr tablet Take 1 1/2 tabs (75m) by mouth daily. 45 tablet 6  . nitroGLYCERIN (NITROSTAT) 0.4 MG SL tablet Place 1 tablet (0.4 mg total) under the tongue every 5 (five) minutes x 3 doses as needed for chest pain. 25 tablet 3  . rivaroxaban (XARELTO) 20 MG TABS tablet Take 1 tablet (20 mg total) by mouth daily with supper. (Patient not taking: Reported on 12/24/2016) 21 tablet 0   No current facility-administered medications for this visit.    Allergies:  Niaspan [niacin er]   Social History: The patient  reports that  he quit smoking about 23 years ago. He smoked 0.25 packs per day. He has never used smokeless tobacco. He reports that he does not drink alcohol or use drugs.   ROS:  Please see the history of present illness. Otherwise, complete review of systems is positive for right hip pain, dyspnea exertion.  All other systems are reviewed and negative.   Physical Exam: VS:  BP 124/60   Pulse (!) 50   Ht 5' 8"  (1.727 m)   Wt 218 lb (98.9 kg)   SpO2 96%   BMI 33.15 kg/m , BMI Body mass index is 33.15 kg/m.  Wt Readings from Last 3 Encounters:  12/24/16 218 lb (98.9 kg)  12/19/16 220 lb (99.8 kg)  12/02/16 215 lb (97.5  kg)    General: Obese elderly male, appears comfortable at rest. HEENT: Conjunctiva and lids normal, oropharynx clear. Neck: Supple, no elevated JVP or carotid bruits, no thyromegaly. Lungs: No wheezing, nonlabored breathing at rest. Cardiac: Irregularly irregular, no S3, 1/6 systolic murmur, no pericardial rub. Abdomen: Soft, nontender, bowel sounds present, no guarding or rebound. Extremities: Firm area in right gluteus consistent with hematoma. Tracking ecchymoses down the posterior right thigh, distal pulses 2+. Skin: Warm and dry. Musculoskeletal: No kyphosis. Neuropsychiatric: Alert and oriented x3, affect grossly appropriate.  ECG: I personally reviewed the tracing from 11/19/2016 which showed atrial fibrillation with NSST changes.  Recent Labwork: 11/18/2016: BUN 19; Creatinine, Ser 1.08; Hemoglobin 14.7; Platelets 152; Potassium 3.8; Sodium 140   Other Studies Reviewed Today:  Echocardiogram 11/26/2015: Study Conclusions  - Left ventricle: The cavity size was normal. Wall thickness was normal. Systolic function was normal. The estimated ejection fraction was in the range of 55% to 60%. Although no diagnostic regional wall motion abnormality was identified, this possibility cannot be completely excluded on the basis of this study. The study is not technically sufficient to allow evaluation of LV diastolic function. - Aortic valve: Mildly calcified annulus. Trileaflet; mildly calcified leaflets. There was trivial regurgitation. - Aortic root: The aortic root was mildly dilated. - Mitral valve: Calcified annulus. There was trivial regurgitation. - Left atrium: The atrium was moderately dilated. - Right ventricle: The cavity size was mildly dilated. Systolic function was mildly reduced. - Right atrium: The atrium was mildly dilated. Central venous pressure (est): 3 mm Hg. - Tricuspid valve: There was mild regurgitation. - Pulmonary arteries: PA peak  pressure: 23 mm Hg (S). - Pericardium, extracardiac: There was no pericardial effusion.  Impressions:  - Normal LV wall thickness with LVEF 55-60%. Indeterminate diastolic function in the setting of atrial fibrillation. Moderate left atrial enlargement. MAC with trivial mitral regurgitation. Mildly dilated aortic root. Sclerotic aortic valve with trivial aortic regurgitation. Mildly dilated RV with mildly reduced contraction. Mild tricuspid regurgitation with PASP 23 mmHg.  Lexiscan Myoview 11/19/2015:  There was no ST segment deviation noted during stress.  Defect 1: There is a medium defect of mild severity present in the basal inferoseptal, basal inferior, basal inferolateral, mid inferoseptal, mid inferior and mid inferolateral location.  Findings consistent with prior myocardial infarction with peri-infarct ischemia.  This is an intermediate risk study.  Nuclear stress EF: 45%.  Assessment and Plan:  1. Patient presents with apparent spontaneous right gluteal hematoma, feels fairly large on examination with tracking of ecchymoses down the right posterior thigh. He was seen in the ER for this on October 14. I have recommended he stop Xarelto. Check CBC and BMET. He will also be sent for CT imaging of  the abdomen and pelvis to rule out retroperitoneal hematoma, also imaging of the right gluteal area and thigh.  2. Dyspnea on exertion and intermittent angina symptoms. He has had no major improvement following medication adjustments, and we will still ultimately pursue a cardiac catheterization. In light of problem #1 however, we will get this stabilized first prior to proceeding.  3. Persistent atrial fibrillation, heart rate is better controlled following increase in Toprol-XL. We are stopping Xarelto for now.  4. Essential hypertension, blood pressure is well controlled today.  5. CAD with history of mild to moderate left main stenosis and occluded RCA with  collaterals as of 2013.  Current medicines were reviewed with the patient today.   Orders Placed This Encounter  Procedures  . CT ABDOMEN PELVIS WO CONTRAST  . CBC  . Basic Metabolic Panel (BMET)    Disposition: Follow-up in a few weeks.  Signed, Satira Sark, MD, [facility] 12/24/2016 1:33 PM    [facility] [address], [place],  [address] Phone: [phone]; Fax: [phone]

## 2016-12-24 ENCOUNTER — Other Ambulatory Visit (HOSPITAL_COMMUNITY)
Admission: RE | Admit: 2016-12-24 | Discharge: 2016-12-24 | Disposition: A | Discharge status: Home / Self Care | Payer: Medicare Other | Source: Ambulatory Visit | Attending: Cardiology | Admitting: Cardiology

## 2016-12-24 ENCOUNTER — Ambulatory Visit (HOSPITAL_COMMUNITY)
Admission: RE | Admit: 2016-12-24 | Discharge: 2016-12-24 | Disposition: A | Discharge status: Home / Self Care | Payer: Medicare Other | Source: Ambulatory Visit | Attending: Cardiology | Admitting: Cardiology

## 2016-12-24 ENCOUNTER — Encounter: Payer: Self-pay | Admitting: Cardiology

## 2016-12-24 ENCOUNTER — Ambulatory Visit (INDEPENDENT_AMBULATORY_CARE_PROVIDER_SITE_OTHER): Payer: Medicare Other | Admitting: Cardiology

## 2016-12-24 VITALS — BP 124/60 | HR 50 | Ht 68.0 in | Wt 218.0 lb

## 2016-12-24 DIAGNOSIS — S7001XA Contusion of right hip, initial encounter: Secondary | ICD-10-CM | POA: Insufficient documentation

## 2016-12-24 DIAGNOSIS — I481 Persistent atrial fibrillation: Secondary | ICD-10-CM

## 2016-12-24 DIAGNOSIS — I25119 Atherosclerotic heart disease of native coronary artery with unspecified angina pectoris: Secondary | ICD-10-CM

## 2016-12-24 DIAGNOSIS — X58XXXA Exposure to other specified factors, initial encounter: Secondary | ICD-10-CM | POA: Diagnosis not present

## 2016-12-24 DIAGNOSIS — I7789 Other specified disorders of arteries and arterioles: Secondary | ICD-10-CM | POA: Insufficient documentation

## 2016-12-24 DIAGNOSIS — R0609 Other forms of dyspnea: Secondary | ICD-10-CM | POA: Diagnosis not present

## 2016-12-24 DIAGNOSIS — I2510 Atherosclerotic heart disease of native coronary artery without angina pectoris: Secondary | ICD-10-CM | POA: Insufficient documentation

## 2016-12-24 DIAGNOSIS — I700 Atherosclerosis of aorta: Secondary | ICD-10-CM | POA: Diagnosis not present

## 2016-12-24 DIAGNOSIS — I10 Essential (primary) hypertension: Secondary | ICD-10-CM | POA: Diagnosis not present

## 2016-12-24 DIAGNOSIS — I4819 Other persistent atrial fibrillation: Secondary | ICD-10-CM

## 2016-12-24 DIAGNOSIS — N400 Enlarged prostate without lower urinary tract symptoms: Secondary | ICD-10-CM | POA: Diagnosis not present

## 2016-12-24 DIAGNOSIS — I209 Angina pectoris, unspecified: Secondary | ICD-10-CM

## 2016-12-24 DIAGNOSIS — K8020 Calculus of gallbladder without cholecystitis without obstruction: Secondary | ICD-10-CM | POA: Insufficient documentation

## 2016-12-24 LAB — BASIC METABOLIC PANEL
ANION GAP: 7 (ref 5–15)
BUN: 16 mg/dL (ref 6–20)
CO2: 28 mmol/L (ref 22–32)
Calcium: 8.7 mg/dL — ABNORMAL LOW (ref 8.9–10.3)
Chloride: 100 mmol/L — ABNORMAL LOW (ref 101–111)
Creatinine, Ser: 0.9 mg/dL (ref 0.61–1.24)
GFR calc Af Amer: >60 mL/min (ref >60)
Glucose, Bld: 149 mg/dL — ABNORMAL HIGH (ref 65–99)
Potassium: 4.1 mmol/L (ref 3.5–5.1)
SODIUM: 135 mmol/L (ref 135–145)

## 2016-12-24 LAB — CBC
HEMATOCRIT: 39.3 % (ref 39.0–52.0)
HEMOGLOBIN: 12.9 g/dL — AB (ref 13.0–17.0)
MCH: 31.7 pg (ref 26.0–34.0)
MCHC: 32.8 g/dL (ref 30.0–36.0)
MCV: 96.6 fL (ref 78.0–100.0)
Platelets: 177 10*3/uL (ref 150–400)
RBC: 4.07 MIL/uL — AB (ref 4.22–5.81)
RDW: 13.6 % (ref 11.5–15.5)
WBC: 7.8 10*3/uL (ref 4.0–10.5)

## 2016-12-24 MED ORDER — NITROGLYCERIN 0.4 MG SL SUBL: 0.4000 mg | SUBLINGUAL_TABLET | SUBLINGUAL | 3 refills | Status: DC | PRN

## 2016-12-24 MED ORDER — NITROGLYCERIN 0.4 MG SL SUBL
0.4000 mg | SUBLINGUAL_TABLET | SUBLINGUAL | 3 refills | Status: DC | PRN
Start: 2016-12-24 — End: 2016-12-24

## 2016-12-24 NOTE — Patient Instructions (Addendum)
Your physician recommends that you schedule a follow-up appointment in: 2  Weeks Dr.McDowell     Get abdominal , pelvis CT NOW   Get Lab work NOW     STOP Xarelto   Apply moist heat to the area   Thank you for choosing [facility] !

## 2016-12-27 ENCOUNTER — Telehealth: Payer: Self-pay

## 2016-12-27 DIAGNOSIS — T148XXA Other injury of unspecified body region, initial encounter: Secondary | ICD-10-CM

## 2016-12-27 NOTE — Telephone Encounter (Signed)
-----   Message from Satira Sark, MD sent at 12/25/2016  7:21 AM EDT ----- Results reviewed. Results noted yesterday and discussed with Ms. West Pugh. No retroperitoneal hematoma. Continue to hold off on Xarelto for now. Warm compresses as needed. He should have a CBC for his next follow-up visit. A copy of this test should be forwarded to Manon Hilding, MD.

## 2016-12-27 NOTE — Telephone Encounter (Signed)
Pt notified of CT results, continues to use warm compresses, will have CBC done before visit on 01/11/17

## 2017-01-06 ENCOUNTER — Other Ambulatory Visit (HOSPITAL_COMMUNITY)
Admission: RE | Admit: 2017-01-06 | Discharge: 2017-01-06 | Disposition: A | Discharge status: Home / Self Care | Payer: Medicare Other | Source: Ambulatory Visit | Attending: Cardiology | Admitting: Cardiology

## 2017-01-06 DIAGNOSIS — X58XXXA Exposure to other specified factors, initial encounter: Secondary | ICD-10-CM | POA: Insufficient documentation

## 2017-01-06 DIAGNOSIS — T148XXA Other injury of unspecified body region, initial encounter: Secondary | ICD-10-CM | POA: Insufficient documentation

## 2017-01-06 LAB — CBC
HEMATOCRIT: 42 % (ref 39.0–52.0)
HEMOGLOBIN: 13.7 g/dL (ref 13.0–17.0)
MCH: 31.3 pg (ref 26.0–34.0)
MCHC: 32.6 g/dL (ref 30.0–36.0)
MCV: 95.9 fL (ref 78.0–100.0)
Platelets: 192 10*3/uL (ref 150–400)
RBC: 4.38 MIL/uL (ref 4.22–5.81)
RDW: 14 % (ref 11.5–15.5)
WBC: 6.8 10*3/uL (ref 4.0–10.5)

## 2017-01-10 DIAGNOSIS — L570 Actinic keratosis: Secondary | ICD-10-CM | POA: Diagnosis not present

## 2017-01-11 ENCOUNTER — Ambulatory Visit (INDEPENDENT_AMBULATORY_CARE_PROVIDER_SITE_OTHER): Payer: Medicare Other | Admitting: Cardiology

## 2017-01-11 ENCOUNTER — Other Ambulatory Visit: Payer: Self-pay | Admitting: Cardiology

## 2017-01-11 ENCOUNTER — Encounter: Payer: Self-pay | Admitting: Cardiology

## 2017-01-11 ENCOUNTER — Other Ambulatory Visit (HOSPITAL_COMMUNITY)
Admission: RE | Admit: 2017-01-11 | Discharge: 2017-01-11 | Disposition: A | Discharge status: Home / Self Care | Payer: Medicare Other | Source: Ambulatory Visit | Attending: Cardiology | Admitting: Cardiology

## 2017-01-11 VITALS — BP 128/80 | HR 94 | Ht 68.0 in | Wt 216.0 lb

## 2017-01-11 DIAGNOSIS — R0609 Other forms of dyspnea: Secondary | ICD-10-CM | POA: Diagnosis not present

## 2017-01-11 DIAGNOSIS — I25119 Atherosclerotic heart disease of native coronary artery with unspecified angina pectoris: Secondary | ICD-10-CM

## 2017-01-11 DIAGNOSIS — I10 Essential (primary) hypertension: Secondary | ICD-10-CM | POA: Diagnosis not present

## 2017-01-11 DIAGNOSIS — I481 Persistent atrial fibrillation: Secondary | ICD-10-CM

## 2017-01-11 DIAGNOSIS — I4819 Other persistent atrial fibrillation: Secondary | ICD-10-CM

## 2017-01-11 DIAGNOSIS — T148XXA Other injury of unspecified body region, initial encounter: Secondary | ICD-10-CM

## 2017-01-11 DIAGNOSIS — I209 Angina pectoris, unspecified: Secondary | ICD-10-CM

## 2017-01-11 DIAGNOSIS — Z01818 Encounter for other preprocedural examination: Secondary | ICD-10-CM | POA: Diagnosis not present

## 2017-01-11 LAB — PROTIME-INR
INR: 0.96
PROTHROMBIN TIME: 12.7 s (ref 11.4–15.2)

## 2017-01-11 LAB — CBC WITH DIFFERENTIAL/PLATELET
Basophils Absolute: 0 10*3/uL (ref 0.0–0.1)
Basophils Relative: 1 %
EOS ABS: 0.3 10*3/uL (ref 0.0–0.7)
Eosinophils Relative: 4 %
HEMATOCRIT: 43.1 % (ref 39.0–52.0)
HEMOGLOBIN: 14.2 g/dL (ref 13.0–17.0)
LYMPHS ABS: 2.3 10*3/uL (ref 0.7–4.0)
Lymphocytes Relative: 31 %
MCH: 31.6 pg (ref 26.0–34.0)
MCHC: 32.9 g/dL (ref 30.0–36.0)
MCV: 95.8 fL (ref 78.0–100.0)
MONOS PCT: 9 %
Monocytes Absolute: 0.6 10*3/uL (ref 0.1–1.0)
NEUTROS PCT: 57 %
Neutro Abs: 4.3 10*3/uL (ref 1.7–7.7)
Platelets: 166 10*3/uL (ref 150–400)
RBC: 4.5 MIL/uL (ref 4.22–5.81)
RDW: 14 % (ref 11.5–15.5)
WBC: 7.6 10*3/uL (ref 4.0–10.5)

## 2017-01-11 LAB — BASIC METABOLIC PANEL
ANION GAP: 10 (ref 5–15)
BUN: 14 mg/dL (ref 6–20)
CHLORIDE: 99 mmol/L — AB (ref 101–111)
CO2: 25 mmol/L (ref 22–32)
Calcium: 9 mg/dL (ref 8.9–10.3)
Creatinine, Ser: 0.92 mg/dL (ref 0.61–1.24)
GFR calc Af Amer: >60 mL/min (ref >60)
GFR calc non Af Amer: >60 mL/min (ref >60)
GLUCOSE: 131 mg/dL — AB (ref 65–99)
POTASSIUM: 4 mmol/L (ref 3.5–5.1)
Sodium: 134 mmol/L — ABNORMAL LOW (ref 135–145)

## 2017-01-11 NOTE — H&P (View-Only) (Signed)
Cardiology Office Note  Date: 01/11/2017   ID: Richard [name], DOB June 15, 1933, MRN [id number]  PCP: [facility]  Primary Cardiologist: Rozann Lesches, MD   Chief Complaint  Patient presents with  . Cardiac follow-up    History of Present Illness: Richard [name] is an 83 y.o. male seen recently in October.  He presents with his wife for a follow-up visit.  He states that the previous right gluteal intramuscular hematoma continues to resolve. We stopped Xarelto at the last visit due to a spontaneous intramuscular hematoma in the right gluteal area. CT imaging on October 19 showed subcutaneous bruising and intramuscular hematoma superficial to the greater trochanter of the right femur, no retroperitoneal hemorrhage. Serial hemoglobin levels showed improvement from 12.9-13.7 without specific intervention otherwise.  Otherwise, he remains short of breath with activity, reports exertional fatigue and lack of stamina.  We have adjusted his medications for heart rate control of atrial fibrillation without specific benefit so far.  He underwent ischemic testing within the last year as well, outlined below.  We have discussed referral for right and left heart catheterization to reassess coronary anatomy and hemodynamics and help guide treatment options.  It may be that medical therapy remains the best option.  We discussed this again today, including risks and benefits and he is in agreement to proceed.  He will stay off Xarelto for the time being we will discuss considering resumption of anticoagulation after his cardiac catheterization is completed.  Reviewed his medications which are outlined below and otherwise stable from a cardiac perspective.  I asked her to start taking aspirin 81 mg daily.  Past Medical History:  Diagnosis Date  . CAD (coronary artery disease)    Mild-moderate LM with occluded RCA (collaterals) - managed medically 2013  . GERD (gastroesophageal  reflux disease)   . Hypertensive heart disease   . Obesity (BMI 30.0-34.9)   . Old inferior myocardial infarction 1995  . PAF (paroxysmal atrial fibrillation) ([place])   . Pulmonary embolus ([place])    [name] 2015    Past Surgical History:  Procedure Laterality Date  . ACHILLES TENDON REPAIR    . APPENDECTOMY    . SHOULDER SURGERY      Current Outpatient Medications  Medication Sig Dispense Refill  . aspirin EC 81 MG tablet Take 81 mg daily by mouth.    Marland Kitchen atorvastatin (LIPITOR) 10 MG tablet Take 10 mg by mouth every evening.    . diltiazem (CARDIZEM CD) 240 MG 24 hr capsule Take 240 mg by mouth daily.    Marland Kitchen HYDROcodone-acetaminophen (NORCO/VICODIN) 5-325 MG tablet Take 1 tablet by mouth every 4 (four) hours as needed for moderate pain. 10 tablet 0  . isosorbide mononitrate (IMDUR) 30 MG 24 hr tablet Take 1 tablet (30 mg total) by mouth every evening. 30 tablet 6  . losartan (COZAAR) 25 MG tablet Take 25 mg by mouth daily.    . metFORMIN (GLUCOPHAGE-XR) 500 MG 24 hr tablet Take 500 mg by mouth 2 (two) times daily.     . metoprolol succinate (TOPROL-XL) 50 MG 24 hr tablet Take 1 1/2 tabs (54m) by mouth daily. 45 tablet 6  . nitroGLYCERIN (NITROSTAT) 0.4 MG SL tablet Place 1 tablet (0.4 mg total) under the tongue every 5 (five) minutes x 3 doses as needed for chest pain. 25 tablet 3  . rivaroxaban (XARELTO) 20 MG TABS tablet Take 1 tablet (20 mg total) by mouth daily with supper. (Patient not taking: Reported on  12/24/2016) 21 tablet 0   No current facility-administered medications for this visit.    Allergies:  Niaspan [niacin er]   Social History: The patient  reports that he quit smoking about 23 years ago. He smoked 0.25 packs per day. he has never used smokeless tobacco. He reports that he does not drink alcohol or use drugs.   Family History: The patient's family history includes Heart failure in his mother.   ROS:  Please see the history of present illness. Otherwise, complete  review of systems is positive for hearing loss, arthritic pain and stiffness.  All other systems are reviewed and negative.   Physical Exam: VS:  BP 128/80   Pulse 94   Ht _0  (1.727 m)   Wt 216 lb (98 kg)   SpO2 97%   BMI 32.84 kg/m , BMI Body mass index is 32.84 kg/m.  Wt Readings from Last 3 Encounters:  01/11/17 216 lb (98 kg)  12/24/16 218 lb (98.9 kg)  12/19/16 220 lb (99.8 kg)    General: Overweight elderly male, patient appears comfortable at rest. HEENT: Conjunctiva and lids normal, oropharynx clear. Neck: Supple, no elevated JVP or carotid bruits, no thyromegaly. Lungs: Clear to auscultation, nonlabored breathing at rest. Cardiac: Irregularly irregular, no S3, soft systolic murmur, no pericardial rub. Abdomen: Soft, nontender, bowel sounds present, no guarding or rebound. Extremities: Resolving ecchymoses right lower leg, no pitting edema, distal pulses 2+. Skin: Warm and dry. Musculoskeletal: No kyphosis. Neuropsychiatric: Alert and oriented x3, affect grossly appropriate.  ECG: I personally reviewed the tracing from 11/18/2016 which showed rate-controlled atrial fibrillation with nonspecific ST-T changes.  Recent Labwork: 12/24/2016: BUN 16; Creatinine, Ser 0.90; Potassium 4.1; Sodium 135 01/06/2017: Hemoglobin 13.7; Platelets 192     Component Value Date/Time   CHOL 141 01/22/2012 0515   TRIG 122 01/22/2012 0515   HDL 36 (L) 01/22/2012 0515   CHOLHDL 3.9 01/22/2012 0515   VLDL 24 01/22/2012 0515   [place] 81 01/22/2012 0515    Other Studies Reviewed Today:  Abdominal and pelvic CT 12/24/2016: IMPRESSION: 1. Subcutaneous bruising and intramuscular hematoma superficial to the greater trochanter of the right femur. 2. No fracture identified. 3. No retroperitoneal hemorrhage. 4. Coronary artery atherosclerosis. Aortic Atherosclerosis (ICD10-I70.0). 5. Pulmonary artery enlargement suggests pulmonary arterial hypertension. 6. Cholelithiasis. 7.  Prostatomegaly.  Echocardiogram 11/26/2015: Study Conclusions  - Left ventricle: The cavity size was normal. Wall thickness was   normal. Systolic function was normal. The estimated ejection   fraction was in the range of 55% to 60%. Although no diagnostic   regional wall motion abnormality was identified, this possibility   cannot be completely excluded on the basis of this study. The   study is not technically sufficient to allow evaluation of LV   diastolic function. - Aortic valve: Mildly calcified annulus. Trileaflet; mildly   calcified leaflets. There was trivial regurgitation. - Aortic root: The aortic root was mildly dilated. - Mitral valve: Calcified annulus. There was trivial regurgitation. - Left atrium: The atrium was moderately dilated. - Right ventricle: The cavity size was mildly dilated. Systolic   function was mildly reduced. - Right atrium: The atrium was mildly dilated. Central venous   pressure (est): 3 mm Hg. - Tricuspid valve: There was mild regurgitation. - Pulmonary arteries: PA peak pressure: 23 mm Hg (S). - Pericardium, extracardiac: There was no pericardial effusion.  Impressions:  - Normal LV wall thickness with LVEF 55-60%. Indeterminate   diastolic function in the setting of atrial fibrillation.  Moderate left atrial enlargement. MAC with trivial mitral   regurgitation. Mildly dilated aortic root. Sclerotic aortic valve   with trivial aortic regurgitation. Mildly dilated RV with mildly   reduced contraction. Mild tricuspid regurgitation with PASP 23   mmHg.  Lexiscan Myoview 11/19/2015:  There was no ST segment deviation noted during stress.  Defect 1: There is a medium defect of mild severity present in the basal inferoseptal, basal inferior, basal inferolateral, mid inferoseptal, mid inferior and mid inferolateral location.  Findings consistent with prior myocardial infarction with peri-infarct ischemia.  This is an intermediate risk  study.  Nuclear stress EF: 45%.  Assessment and Plan:  1.  Exertional fatigue and dyspnea as well as intermittent angina symptoms.  Myoview study from last year showed inferior/inferolateral scar with peri-infarct ischemia, medical therapy has been adjusted.  He reports decline in stamina since that time and we will plan to proceed with a right and left heart catheterization for further assessment.  Risks and benefits discussed and he is in agreement to proceed.  Start aspirin 81 mg daily at this time since off Xarelto.  2.  Persistent atrial fibrillation, heart rate control is better following increase in Toprol-XL.  3.  History of spontaneous intramuscular hematoma in the right gluteal area on Xarelto.  No evidence of retroperitoneal hematoma by CT imaging.  This area has improved with tracking ecchymosis down the right leg since then.  Hemoglobin normalized without transfusion.  For now we will keep him off anticoagulation pending further invasive cardiac testing as noted above.  4.  Essential hypertension, blood pressure is adequately controlled today.  5.  CAD with occluded RCA associated with collaterals and mild to moderate left main disease as of angiography in 2013.  Current medicines were reviewed with the patient today.  Disposition: Follow-up after cardiac catheterization.  Signed, Satira Sark, MD, [facility] 01/11/2017 1:39 PM    [facility] at [facility] [address], [place], [place] [address] Phone: [phone]; Fax: [phone]

## 2017-01-11 NOTE — Patient Instructions (Addendum)
   [place] [address] Dept: [phone] Loc: [phone]  Richard [name]  01/11/2017  You are scheduled for a Cardiac Catheterization on Thursday, November 8 with Dr. Lauree Chandler.  1. Please arrive at the [facility] (Main Entrance A) at [facility]: [address] [place], [place] [address] at 8:00 AM (two hours before your procedure to ensure your preparation). Free valet parking service is available.   Special note: Every effort is made to have your procedure done on time. Please understand that emergencies sometimes delay scheduled procedures.  2. Diet: Do not eat or drink anything after midnight prior to your procedure except sips of water to take medications.  3. Labs: Today at [facility] 4. Medication instructions in preparation for your procedure:  *For reference purposes while preparing patient instructions.   Delete this med list prior to printing instructions for patient.*    Stop taking, Glucophage (Metformin) on Wednesday, November 7.  HOLD the DAY of cath AND for 2 days after   Continue to HOLD Xarelto  On the morning of your procedure, take your Aspirin and any morning medicines NOT listed above.  You may use sips of water.  5. Plan for one night stay--bring personal belongings. 6. Bring a current list of your medications and current insurance cards. 7. You MUST have a responsible person to drive you home. 8. Someone MUST be with you the first 24 hours after you arrive home or your discharge will be delayed. 9. Please wear clothes that are easy to get on and off and wear slip-on shoes.  Thank you for allowing [place] to care for you!   -- [facility]

## 2017-01-11 NOTE — Progress Notes (Signed)
  Cardiology Office Note  Date: 01/11/2017   ID: Richard [name], DOB 06/26/1933, MRN [id number]  PCP: [facility], [facility]  Primary Cardiologist: Samuel McDowell, MD   Chief Complaint  Patient presents with  . Cardiac follow-up    History of Present Illness: Richard [name] is an 83 y.o. male seen recently in October.  He presents with his wife for a follow-up visit.  He states that the previous right gluteal intramuscular hematoma continues to resolve. We stopped Xarelto at the last visit due to a spontaneous intramuscular hematoma in the right gluteal area. CT imaging on October 19 showed subcutaneous bruising and intramuscular hematoma superficial to the greater trochanter of the right femur, no retroperitoneal hemorrhage. Serial hemoglobin levels showed improvement from 12.9-13.7 without specific intervention otherwise.  Otherwise, he remains short of breath with activity, reports exertional fatigue and lack of stamina.  We have adjusted his medications for heart rate control of atrial fibrillation without specific benefit so far.  He underwent ischemic testing within the last year as well, outlined below.  We have discussed referral for right and left heart catheterization to reassess coronary anatomy and hemodynamics and help guide treatment options.  It may be that medical therapy remains the best option.  We discussed this again today, including risks and benefits and he is in agreement to proceed.  He will stay off Xarelto for the time being we will discuss considering resumption of anticoagulation after his cardiac catheterization is completed.  Reviewed his medications which are outlined below and otherwise stable from a cardiac perspective.  I asked her to start taking aspirin 81 mg daily.  Past Medical History:  Diagnosis Date  . CAD (coronary artery disease)    Mild-moderate LM with occluded RCA (collaterals) - managed medically 2013  . GERD (gastroesophageal  reflux disease)   . Hypertensive heart disease   . Obesity (BMI 30.0-34.9)   . Old inferior myocardial infarction 1995  . PAF (paroxysmal atrial fibrillation) (HCC)   . Pulmonary embolus (HCC)    [name] 2015    Past Surgical History:  Procedure Laterality Date  . ACHILLES TENDON REPAIR    . APPENDECTOMY    . SHOULDER SURGERY      Current Outpatient Medications  Medication Sig Dispense Refill  . aspirin EC 81 MG tablet Take 81 mg daily by mouth.    . atorvastatin (LIPITOR) 10 MG tablet Take 10 mg by mouth every evening.    . diltiazem (CARDIZEM CD) 240 MG 24 hr capsule Take 240 mg by mouth daily.    . HYDROcodone-acetaminophen (NORCO/VICODIN) 5-325 MG tablet Take 1 tablet by mouth every 4 (four) hours as needed for moderate pain. 10 tablet 0  . isosorbide mononitrate (IMDUR) 30 MG 24 hr tablet Take 1 tablet (30 mg total) by mouth every evening. 30 tablet 6  . losartan (COZAAR) 25 MG tablet Take 25 mg by mouth daily.    . metFORMIN (GLUCOPHAGE-XR) 500 MG 24 hr tablet Take 500 mg by mouth 2 (two) times daily.     . metoprolol succinate (TOPROL-XL) 50 MG 24 hr tablet Take 1 1/2 tabs (75mg) by mouth daily. 45 tablet 6  . nitroGLYCERIN (NITROSTAT) 0.4 MG SL tablet Place 1 tablet (0.4 mg total) under the tongue every 5 (five) minutes x 3 doses as needed for chest pain. 25 tablet 3  . rivaroxaban (XARELTO) 20 MG TABS tablet Take 1 tablet (20 mg total) by mouth daily with supper. (Patient not taking: Reported on   12/24/2016) 21 tablet 0   No current facility-administered medications for this visit.    Allergies:  Niaspan [niacin er]   Social History: The patient  reports that he quit smoking about 23 years ago. He smoked 0.25 packs per day. he has never used smokeless tobacco. He reports that he does not drink alcohol or use drugs.   Family History: The patient's family history includes Heart failure in his mother.   ROS:  Please see the history of present illness. Otherwise, complete  review of systems is positive for hearing loss, arthritic pain and stiffness.  All other systems are reviewed and negative.   Physical Exam: VS:  BP 128/80   Pulse 94   Ht 5' 8" (1.727 m)   Wt 216 lb (98 kg)   SpO2 97%   BMI 32.84 kg/m , BMI Body mass index is 32.84 kg/m.  Wt Readings from Last 3 Encounters:  01/11/17 216 lb (98 kg)  12/24/16 218 lb (98.9 kg)  12/19/16 220 lb (99.8 kg)    General: Overweight elderly male, patient appears comfortable at rest. HEENT: Conjunctiva and lids normal, oropharynx clear. Neck: Supple, no elevated JVP or carotid bruits, no thyromegaly. Lungs: Clear to auscultation, nonlabored breathing at rest. Cardiac: Irregularly irregular, no S3, soft systolic murmur, no pericardial rub. Abdomen: Soft, nontender, bowel sounds present, no guarding or rebound. Extremities: Resolving ecchymoses right lower leg, no pitting edema, distal pulses 2+. Skin: Warm and dry. Musculoskeletal: No kyphosis. Neuropsychiatric: Alert and oriented x3, affect grossly appropriate.  ECG: I personally reviewed the tracing from 11/18/2016 which showed rate-controlled atrial fibrillation with nonspecific ST-T changes.  Recent Labwork: 12/24/2016: BUN 16; Creatinine, Ser 0.90; Potassium 4.1; Sodium 135 01/06/2017: Hemoglobin 13.7; Platelets 192     Component Value Date/Time   CHOL 141 01/22/2012 0515   TRIG 122 01/22/2012 0515   HDL 36 (L) 01/22/2012 0515   CHOLHDL 3.9 01/22/2012 0515   VLDL 24 01/22/2012 0515   LDLCALC 81 01/22/2012 0515    Other Studies Reviewed Today:  Abdominal and pelvic CT 12/24/2016: IMPRESSION: 1. Subcutaneous bruising and intramuscular hematoma superficial to the greater trochanter of the right femur. 2. No fracture identified. 3. No retroperitoneal hemorrhage. 4. Coronary artery atherosclerosis. Aortic Atherosclerosis (ICD10-I70.0). 5. Pulmonary artery enlargement suggests pulmonary arterial hypertension. 6. Cholelithiasis. 7.  Prostatomegaly.  Echocardiogram 11/26/2015: Study Conclusions  - Left ventricle: The cavity size was normal. Wall thickness was   normal. Systolic function was normal. The estimated ejection   fraction was in the range of 55% to 60%. Although no diagnostic   regional wall motion abnormality was identified, this possibility   cannot be completely excluded on the basis of this study. The   study is not technically sufficient to allow evaluation of LV   diastolic function. - Aortic valve: Mildly calcified annulus. Trileaflet; mildly   calcified leaflets. There was trivial regurgitation. - Aortic root: The aortic root was mildly dilated. - Mitral valve: Calcified annulus. There was trivial regurgitation. - Left atrium: The atrium was moderately dilated. - Right ventricle: The cavity size was mildly dilated. Systolic   function was mildly reduced. - Right atrium: The atrium was mildly dilated. Central venous   pressure (est): 3 mm Hg. - Tricuspid valve: There was mild regurgitation. - Pulmonary arteries: PA peak pressure: 23 mm Hg (S). - Pericardium, extracardiac: There was no pericardial effusion.  Impressions:  - Normal LV wall thickness with LVEF 55-60%. Indeterminate   diastolic function in the setting of atrial fibrillation.     Moderate left atrial enlargement. MAC with trivial mitral   regurgitation. Mildly dilated aortic root. Sclerotic aortic valve   with trivial aortic regurgitation. Mildly dilated RV with mildly   reduced contraction. Mild tricuspid regurgitation with PASP 23   mmHg.  Lexiscan Myoview 11/19/2015:  There was no ST segment deviation noted during stress.  Defect 1: There is a medium defect of mild severity present in the basal inferoseptal, basal inferior, basal inferolateral, mid inferoseptal, mid inferior and mid inferolateral location.  Findings consistent with prior myocardial infarction with peri-infarct ischemia.  This is an intermediate risk  study.  Nuclear stress EF: 45%.  Assessment and Plan:  1.  Exertional fatigue and dyspnea as well as intermittent angina symptoms.  Myoview study from last year showed inferior/inferolateral scar with peri-infarct ischemia, medical therapy has been adjusted.  He reports decline in stamina since that time and we will plan to proceed with a right and left heart catheterization for further assessment.  Risks and benefits discussed and he is in agreement to proceed.  Start aspirin 81 mg daily at this time since off Xarelto.  2.  Persistent atrial fibrillation, heart rate control is better following increase in Toprol-XL.  3.  History of spontaneous intramuscular hematoma in the right gluteal area on Xarelto.  No evidence of retroperitoneal hematoma by CT imaging.  This area has improved with tracking ecchymosis down the right leg since then.  Hemoglobin normalized without transfusion.  For now we will keep him off anticoagulation pending further invasive cardiac testing as noted above.  4.  Essential hypertension, blood pressure is adequately controlled today.  5.  CAD with occluded RCA associated with collaterals and mild to moderate left main disease as of angiography in 2013.  Current medicines were reviewed with the patient today.  Disposition: Follow-up after cardiac catheterization.  Signed, Samuel G. McDowell, MD, FACC 01/11/2017 1:39 PM    [facility] [address], [place], [place] [address] Phone: [phone]; Fax: [phone] 

## 2017-01-12 ENCOUNTER — Telehealth: Payer: Self-pay

## 2017-01-12 NOTE — Telephone Encounter (Signed)
Left detailed message per DPR:  Patient contacted pre-catheterization at [facility] scheduled for:  01/13/2017 @ 1030 Verified arrival time and place:  [facility] @ 0800 Confirmed AM meds to be taken pre-cath with sip of water: Take ASA Holding Xarelto-last dose around 12/02/2016 d/t hematoma Metformin-hold Wednesday and until after procedure Confirmed patient has responsible person to drive home post procedure and observe patient for 24 hours:  yes Addl concerns:  Left this nurse name and # for call back if any questions

## 2017-01-13 ENCOUNTER — Ambulatory Visit (HOSPITAL_COMMUNITY)
Admission: RE | Admit: 2017-01-13 | Discharge: 2017-01-13 | Disposition: A | Discharge status: Home / Self Care | Payer: Medicare Other | Source: Ambulatory Visit | Attending: Cardiovascular Disease | Admitting: Cardiovascular Disease

## 2017-01-13 ENCOUNTER — Encounter (HOSPITAL_COMMUNITY): Payer: Self-pay | Admitting: *Deleted

## 2017-01-13 ENCOUNTER — Encounter (HOSPITAL_COMMUNITY)
Admission: RE | Disposition: A | Discharge status: Home / Self Care | Payer: Self-pay | Source: Ambulatory Visit | Attending: Cardiovascular Disease

## 2017-01-13 DIAGNOSIS — Z87891 Personal history of nicotine dependence: Secondary | ICD-10-CM | POA: Diagnosis not present

## 2017-01-13 DIAGNOSIS — Z7984 Long term (current) use of oral hypoglycemic drugs: Secondary | ICD-10-CM | POA: Diagnosis not present

## 2017-01-13 DIAGNOSIS — E669 Obesity, unspecified: Secondary | ICD-10-CM | POA: Diagnosis not present

## 2017-01-13 DIAGNOSIS — K219 Gastro-esophageal reflux disease without esophagitis: Secondary | ICD-10-CM | POA: Insufficient documentation

## 2017-01-13 DIAGNOSIS — Z7901 Long term (current) use of anticoagulants: Secondary | ICD-10-CM | POA: Diagnosis not present

## 2017-01-13 DIAGNOSIS — I119 Hypertensive heart disease without heart failure: Secondary | ICD-10-CM | POA: Insufficient documentation

## 2017-01-13 DIAGNOSIS — I481 Persistent atrial fibrillation: Secondary | ICD-10-CM | POA: Insufficient documentation

## 2017-01-13 DIAGNOSIS — I480 Paroxysmal atrial fibrillation: Secondary | ICD-10-CM | POA: Diagnosis not present

## 2017-01-13 DIAGNOSIS — I2510 Atherosclerotic heart disease of native coronary artery without angina pectoris: Secondary | ICD-10-CM | POA: Insufficient documentation

## 2017-01-13 DIAGNOSIS — I252 Old myocardial infarction: Secondary | ICD-10-CM | POA: Diagnosis not present

## 2017-01-13 DIAGNOSIS — Z79899 Other long term (current) drug therapy: Secondary | ICD-10-CM | POA: Diagnosis not present

## 2017-01-13 DIAGNOSIS — I25118 Atherosclerotic heart disease of native coronary artery with other forms of angina pectoris: Secondary | ICD-10-CM | POA: Insufficient documentation

## 2017-01-13 DIAGNOSIS — Z86711 Personal history of pulmonary embolism: Secondary | ICD-10-CM | POA: Insufficient documentation

## 2017-01-13 DIAGNOSIS — Z7982 Long term (current) use of aspirin: Secondary | ICD-10-CM | POA: Diagnosis not present

## 2017-01-13 DIAGNOSIS — I25119 Atherosclerotic heart disease of native coronary artery with unspecified angina pectoris: Secondary | ICD-10-CM | POA: Diagnosis not present

## 2017-01-13 LAB — POCT I-STAT 3, ART BLOOD GAS (G3+)
Acid-Base Excess: 2 mmol/L (ref 0.0–2.0)
Bicarbonate: 27.6 mmol/L (ref 20.0–28.0)
O2 SAT: 99 %
PCO2 ART: 48 mmHg (ref 32.0–48.0)
PH ART: 7.367 (ref 7.350–7.450)
TCO2: 29 mmol/L (ref 22–32)
pO2, Arterial: 124 mmHg — ABNORMAL HIGH (ref 83.0–108.0)

## 2017-01-13 LAB — POCT I-STAT 3, VENOUS BLOOD GAS (G3P V)
ACID-BASE EXCESS: 4 mmol/L — AB (ref 0.0–2.0)
Bicarbonate: 30.4 mmol/L — ABNORMAL HIGH (ref 20.0–28.0)
O2 Saturation: 68 %
PH VEN: 7.364 (ref 7.250–7.430)
PO2 VEN: 38 mmHg (ref 32.0–45.0)
TCO2: 32 mmol/L (ref 22–32)
pCO2, Ven: 53.2 mmHg (ref 44.0–60.0)

## 2017-01-13 LAB — GLUCOSE, CAPILLARY
GLUCOSE-CAPILLARY: 192 mg/dL — AB (ref 65–99)
GLUCOSE-CAPILLARY: 143 mg/dL — AB (ref 65–99)

## 2017-01-13 SURGERY — RIGHT HEART CATH AND CORONARY ANGIOGRAPHY
Anesthesia: LOCAL

## 2017-01-13 MED ORDER — HEPARIN SODIUM (PORCINE) 1000 UNIT/ML IJ SOLN
INTRAMUSCULAR | Status: DC | PRN
  Administered 2017-01-13: 5000 [IU] via INTRAVENOUS

## 2017-01-13 MED ORDER — MIDAZOLAM HCL 2 MG/2ML IJ SOLN
INTRAMUSCULAR | Status: AC
  Filled 2017-01-13: qty 2

## 2017-01-13 MED ORDER — FENTANYL CITRATE (PF) 100 MCG/2ML IJ SOLN
INTRAMUSCULAR | Status: AC
  Filled 2017-01-13: qty 2

## 2017-01-13 MED ORDER — SODIUM CHLORIDE 0.9 % WEIGHT BASED INFUSION: 1.0000 mL/kg/h | INTRAVENOUS | Status: DC

## 2017-01-13 MED ORDER — HEPARIN SODIUM (PORCINE) 1000 UNIT/ML IJ SOLN
INTRAMUSCULAR | Status: AC
  Filled 2017-01-13: qty 1

## 2017-01-13 MED ORDER — SODIUM CHLORIDE 0.9 % WEIGHT BASED INFUSION
3.0000 mL/kg/h | INTRAVENOUS | Status: AC
  Administered 2017-01-13: 3 mL/kg/h via INTRAVENOUS

## 2017-01-13 MED ORDER — LIDOCAINE HCL (PF) 1 % IJ SOLN
INTRAMUSCULAR | Status: DC | PRN
  Administered 2017-01-13 (×2): 2 mL

## 2017-01-13 MED ORDER — IOPAMIDOL (ISOVUE-370) INJECTION 76%
INTRAVENOUS | Status: DC | PRN
  Administered 2017-01-13: 70 mL via INTRA_ARTERIAL

## 2017-01-13 MED ORDER — LIDOCAINE HCL (PF) 1 % IJ SOLN
INTRAMUSCULAR | Status: AC
  Filled 2017-01-13: qty 30

## 2017-01-13 MED ORDER — HEPARIN (PORCINE) IN NACL 2-0.9 UNIT/ML-% IJ SOLN
INTRAMUSCULAR | Status: AC
  Filled 2017-01-13: qty 1000

## 2017-01-13 MED ORDER — SODIUM CHLORIDE 0.9% FLUSH: 3.0000 mL | Freq: Two times a day (BID) | INTRAVENOUS | Status: DC

## 2017-01-13 MED ORDER — SODIUM CHLORIDE 0.9% FLUSH: 3.0000 mL | INTRAVENOUS | Status: DC | PRN

## 2017-01-13 MED ORDER — MIDAZOLAM HCL 2 MG/2ML IJ SOLN
INTRAMUSCULAR | Status: DC | PRN
  Administered 2017-01-13: 1 mg via INTRAVENOUS

## 2017-01-13 MED ORDER — VERAPAMIL HCL 2.5 MG/ML IV SOLN
INTRAVENOUS | Status: AC
  Filled 2017-01-13: qty 2

## 2017-01-13 MED ORDER — HEPARIN (PORCINE) IN NACL 2-0.9 UNIT/ML-% IJ SOLN
INTRAMUSCULAR | Status: AC | PRN
  Administered 2017-01-13: 1000 mL

## 2017-01-13 MED ORDER — SODIUM CHLORIDE 0.9 % IV SOLN: INTRAVENOUS | Status: AC

## 2017-01-13 MED ORDER — SODIUM CHLORIDE 0.9 % IV SOLN: 250.0000 mL | INTRAVENOUS | Status: DC | PRN

## 2017-01-13 MED ORDER — VERAPAMIL HCL 2.5 MG/ML IV SOLN
INTRAVENOUS | Status: DC | PRN
  Administered 2017-01-13: 10 mL via INTRA_ARTERIAL

## 2017-01-13 MED ORDER — ASPIRIN 81 MG PO CHEW: 81.0000 mg | CHEWABLE_TABLET | ORAL | Status: DC

## 2017-01-13 MED ORDER — FENTANYL CITRATE (PF) 100 MCG/2ML IJ SOLN
INTRAMUSCULAR | Status: DC | PRN
  Administered 2017-01-13: 25 ug via INTRAVENOUS

## 2017-01-13 SURGICAL SUPPLY — 15 items
CATH 5FR JL3.5 JR4 ANG PIG MP (CATHETERS) ×1 IMPLANT
CATH BALLN WEDGE 5F 110CM (CATHETERS) ×1 IMPLANT
CATH EXPO 5F MPA-1 (CATHETERS) ×1 IMPLANT
CATH LAUNCHER 5F RADR (CATHETERS) IMPLANT
CATHETER LAUNCHER 5F RADR (CATHETERS) ×2
DEVICE RAD COMP TR BAND LRG (VASCULAR PRODUCTS) ×1 IMPLANT
GLIDESHEATH SLEND SS 6F .021 (SHEATH) ×1 IMPLANT
GUIDEWIRE .025 260CM (WIRE) ×1 IMPLANT
GUIDEWIRE INQWIRE 1.5J.035X260 (WIRE) IMPLANT
INQWIRE 1.5J .035X260CM (WIRE) ×2
KIT HEART LEFT (KITS) ×2 IMPLANT
PACK CARDIAC CATHETERIZATION (CUSTOM PROCEDURE TRAY) ×2 IMPLANT
SHEATH GLIDE SLENDER 4/5FR (SHEATH) ×1 IMPLANT
TRANSDUCER W/STOPCOCK (MISCELLANEOUS) ×2 IMPLANT
TUBING CIL FLEX 10 FLL-RA (TUBING) ×2 IMPLANT

## 2017-01-13 NOTE — Progress Notes (Signed)
R brachial sheath pulled. Pressure held for 10 minutes. VSS. Level 0. Gauze/transparent dsg applied. Clean, dry, intact.

## 2017-01-13 NOTE — Discharge Instructions (Signed)
Hold Metformin for 48 hour post cath   Radial Site Care Refer to this sheet in the next few weeks. These instructions provide you with information about caring for yourself after your procedure. Your health care provider may also give you more specific instructions. Your treatment has been planned according to current medical practices, but problems sometimes occur. Call your health care provider if you have any problems or questions after your procedure. What can I expect after the procedure? After your procedure, it is typical to have the following:  Bruising at the radial site that usually fades within 1-2 weeks.  Blood collecting in the tissue (hematoma) that may be painful to the touch. It should usually decrease in size and tenderness within 1-2 weeks.  Follow these instructions at home:  Take medicines only as directed by your health care provider.  You may shower 24-48 hours after the procedure or as directed by your health care provider. Remove the bandage (dressing) and gently wash the site with plain soap and water. Pat the area dry with a clean towel. Do not rub the site, because this may cause bleeding.  Do not take baths, swim, or use a hot tub until your health care provider approves.  Check your insertion site every day for redness, swelling, or drainage.  Do not apply powder or lotion to the site.  Do not flex or bend the affected arm for 24 hours or as directed by your health care provider.  Do not push or pull heavy objects with the affected arm for 24 hours or as directed by your health care provider.  Do not lift over 10 lb (4.5 kg) for 5 days after your procedure or as directed by your health care provider.  Ask your health care provider when it is okay to: ? Return to work or school. ? Resume usual physical activities or sports. ? Resume sexual activity.  Do not drive home if you are discharged the same day as the procedure. Have someone else drive  you.  You may drive 24 hours after the procedure unless otherwise instructed by your health care provider.  Do not operate machinery or power tools for 24 hours after the procedure.  If your procedure was done as an outpatient procedure, which means that you went home the same day as your procedure, a responsible adult should be with you for the first 24 hours after you arrive home.  Keep all follow-up visits as directed by your health care provider. This is important. Contact a health care provider if:  You have a fever.  You have chills.  You have increased bleeding from the radial site. Hold pressure on the site. Get help right away if:  You have unusual pain at the radial site.  You have redness, warmth, or swelling at the radial site.  You have drainage (other than a small amount of blood on the dressing) from the radial site.  The radial site is bleeding, and the bleeding does not stop after 30 minutes of holding steady pressure on the site.  Your arm or hand becomes pale, cool, tingly, or numb. This information is not intended to replace advice given to you by your health care provider. Make sure you discuss any questions you have with your health care provider. Document Released: 03/27/2010 Document Revised: 07/31/2015 Document Reviewed: 09/10/2013 Elsevier Interactive Patient Education  2018 [employer].

## 2017-01-13 NOTE — Interval H&P Note (Signed)
History and Physical Interval Note:  01/13/2017 9:03 AM  Richard [name]  has presented today for cardiac cath with the diagnosis of CAD/unstable angina. The various methods of treatment have been discussed with the patient and family. After consideration of risks, benefits and other options for treatment, the patient has consented to  Procedure(s): RIGHT/LEFT HEART CATH AND CORONARY ANGIOGRAPHY (N/A) as a surgical intervention .  The patient's history has been reviewed, patient examined, no change in status, stable for surgery.  I have reviewed the patient's chart and labs.  Questions were answered to the patient's satisfaction.    Cath Lab Visit (complete for each Cath Lab visit)  Clinical Evaluation Leading to the Procedure:   ACS: No.  Non-ACS:    Anginal Classification: CCS III  Anti-ischemic medical therapy: Maximal Therapy (2 or more classes of medications)  Non-Invasive Test Results: No non-invasive testing performed  Prior CABG: No previous CABG         Lauree Chandler

## 2017-02-10 ENCOUNTER — Encounter: Payer: Self-pay | Admitting: Cardiology

## 2017-02-10 ENCOUNTER — Ambulatory Visit (INDEPENDENT_AMBULATORY_CARE_PROVIDER_SITE_OTHER): Payer: Medicare Other | Admitting: Cardiology

## 2017-02-10 VITALS — BP 132/88 | HR 89 | Ht 68.0 in | Wt 216.0 lb

## 2017-02-10 DIAGNOSIS — I10 Essential (primary) hypertension: Secondary | ICD-10-CM | POA: Diagnosis not present

## 2017-02-10 DIAGNOSIS — I25119 Atherosclerotic heart disease of native coronary artery with unspecified angina pectoris: Secondary | ICD-10-CM

## 2017-02-10 DIAGNOSIS — I481 Persistent atrial fibrillation: Secondary | ICD-10-CM

## 2017-02-10 DIAGNOSIS — R0609 Other forms of dyspnea: Secondary | ICD-10-CM

## 2017-02-10 DIAGNOSIS — I209 Angina pectoris, unspecified: Secondary | ICD-10-CM | POA: Diagnosis not present

## 2017-02-10 DIAGNOSIS — I4819 Other persistent atrial fibrillation: Secondary | ICD-10-CM

## 2017-02-10 MED ORDER — METOPROLOL SUCCINATE ER 50 MG PO TB24: 50.0000 mg | ORAL_TABLET | Freq: Two times a day (BID) | ORAL | 3 refills | Status: DC

## 2017-02-10 NOTE — Patient Instructions (Addendum)
Your physician recommends that you schedule a follow-up appointment in:  3 months with Dr.McDowell  Please get lab work JUST BEFORE next visit  INCREASE Toprol XL to 50 mg twice a day  STOP Xarelto  STOP Aspirin   TRY Eliquis 5 mg twice a day, let us know how you do on this drug   Samples eliquis 5 mg given 4 boxes lot [id number], exp 05/2019       Thank you for choosing [facility] !

## 2017-02-10 NOTE — Progress Notes (Signed)
Cardiology Office Note  Date: 02/10/2017   ID: Richard [name], DOB 07/24/1933, MRN [id number]  PCP: [facility]  Primary Cardiologist: Rozann Lesches, MD   Chief Complaint  Patient presents with  . Coronary Artery Disease  . Atrial Fibrillation    History of Present Illness: Richard [name] is an 83 y.o. male last seen in November and referred at that time for a right and left heart catheterization for further evaluation of exertional fatigue and dyspnea with abnormal myocardial perfusion imaging indicating inferior/inferolateral scar with peri-infarct ischemia.  Procedure was performed by Dr. Angelena Form on November 8 and revealed chronic occlusion of the RCA with brisk collateral filling from left to right, mild nonobstructive disease in the left main and LAD with moderate disease in the obtuse marginal system.  He did not have significant pulmonary hypertension.  Medical therapy was recommended.   He presents with his wife today for follow-up.  Overall no change in status, still with dyspnea on exertion.  We went over his medications and discussed trying to further increase heart rate control of atrial fibrillation with adjustment in Toprol-XL to 50 mg twice daily.  We will also try Eliquis instead of Xarelto for stroke prophylaxis and have him stop aspirin.  Past Medical History:  Diagnosis Date  . CAD (coronary artery disease)    Mild-moderate LM with occluded RCA (collaterals) - managed medically 2013  . GERD (gastroesophageal reflux disease)   . Hypertensive heart disease   . Obesity (BMI 30.0-34.9)   . Old inferior myocardial infarction 1995  . PAF (paroxysmal atrial fibrillation) ([facility])   . Pulmonary embolus ([facility])    [name] 2015    Past Surgical History:  Procedure Laterality Date  . ACHILLES TENDON REPAIR    . APPENDECTOMY    . LEFT HEART CATHETERIZATION WITH CORONARY ANGIOGRAM N/A 01/24/2012   Procedure: LEFT HEART CATHETERIZATION WITH  CORONARY ANGIOGRAM;  Surgeon: Jacolyn Reedy, MD;  [facility] C[facility] LAB;  Service: Cardiovascular;  Laterality: N/A;  . SHOULDER SURGERY      Current Outpatient Medications  Medication Sig Dispense Refill  . acetaminophen (TYLENOL) 500 MG tablet Take 1,000 mg every 6 (six) hours as needed by mouth for mild pain or headache.    Marland Kitchen atorvastatin (LIPITOR) 10 MG tablet Take 10 mg by mouth every evening.    . diltiazem (CARDIZEM CD) 240 MG 24 hr capsule Take 240 mg by mouth daily.    . isosorbide mononitrate (IMDUR) 30 MG 24 hr tablet Take 1 tablet (30 mg total) by mouth every evening. 30 tablet 6  . losartan (COZAAR) 25 MG tablet Take 25 mg by mouth daily.    . metFORMIN (GLUCOPHAGE-XR) 500 MG 24 hr tablet Take 500 mg by mouth 2 (two) times daily.     . nitroGLYCERIN (NITROSTAT) 0.4 MG SL tablet Place 1 tablet (0.4 mg total) under the tongue every 5 (five) minutes x 3 doses as needed for chest pain. 25 tablet 3  . metoprolol succinate (TOPROL XL) 50 MG 24 hr tablet Take 1 tablet (50 mg total) by mouth 2 (two) times daily. Take with or immediately following a meal. 180 tablet 3   No current facility-administered medications for this visit.    Allergies:  Niaspan [niacin er]   Social History: The patient  reports that he quit smoking about 23 years ago. He smoked 0.25 packs per day. he has never used smokeless tobacco. He reports that he does not drink alcohol or use  drugs.   ROS:  Please see the history of present illness. Otherwise, complete review of systems is positive for hearing loss.  All other systems are reviewed and negative.   Physical Exam: VS:  BP 132/88 (BP Location: Right Arm)   Pulse 89   Ht 5\' 8"  (1.727 m)   Wt 216 lb (98 kg)   SpO2 93%   BMI 32.84 kg/m , BMI Body mass index is 32.84 kg/m.  Wt Readings from Last 3 Encounters:  02/10/17 216 lb (98 kg)  01/13/17 215 lb (97.5 kg)  01/11/17 216 lb (98 kg)    General: Overweight elderly male, appears comfortable at  rest. HEENT: Conjunctiva and lids normal, oropharynx clear. Neck: Supple, no elevated JVP or carotid bruits, no thyromegaly. Lungs: Clear to auscultation, nonlabored breathing at rest. Cardiac: Irregularly irregular, no S3, soft systolic murmur, no pericardial rub. Abdomen: Soft, nontender, bowel sounds present, no guarding or rebound. Extremities: No pitting edema, distal pulses 2+. Skin: Warm and dry. Musculoskeletal: No kyphosis. Neuropsychiatric: Alert and oriented x3, affect grossly appropriate.  ECG: I personally reviewed the tracing from 01/13/2017 which showed atrial fibrillation with nonspecific ST-T changes.   Recent Labwork: 01/11/2017: BUN 14; Creatinine, Ser 0.92; Hemoglobin 14.2; Platelets 166; Potassium 4.0; Sodium 134   Other Studies Reviewed Today:  Right and left heart catheterization 01/13/2017:  Prox RCA lesion is 100% stenosed.  Ost 1st Mrg to 1st Mrg lesion is 60% stenosed.  Ost Cx to Prox Cx lesion is 20% stenosed.  Ost 2nd Mrg lesion is 50% stenosed.  Prox LAD lesion is 30% stenosed.  Ost 1st Diag to 1st Diag lesion is 40% stenosed.  Ost LM lesion is 20% stenosed.   1. Chronic occlusion RCA with brisk collateral filling from left to right collaterals. This is known from cath in 2013.  2. Mild non-obstructive plaque in the left main and LAD. The second obtuse marginal branch has a moderate stenosis that does not appear to be flow limiting.  3. I did not cross the aortic valve due to radial artery spasm at the end of the case.   Fick Cardiac Output 4.69 L/min  Fick Cardiac Output Index 2.22 (L/min)/BSA  RA A Wave 6 mmHg  RA V Wave 9 mmHg  RA Mean 6 mmHg  RV Systolic Pressure 30 mmHg  RV Diastolic Pressure 2 mmHg  RV EDP 5 mmHg  PA Systolic Pressure 34 mmHg  PA Diastolic Pressure 10 mmHg  PA Mean 20 mmHg  PW A Wave 14 mmHg  PW V Wave 14 mmHg  PW Mean 11 mmHg  AO Systolic Pressure 585 mmHg  AO Diastolic Pressure 82 mmHg  AO Mean 106 mmHg  QP/QS  1  TPVR Index 9 HRUI  TSVR Index 47.72 HRUI  PVR SVR Ratio 0.09  TPVR/TSVR Ratio 0.19   Recommendations: Continue medical management of CAD.   Assessment and Plan:  1.  CAD, recently reevaluated at cardiac catheterization.  He has chronic occlusion of the RCA with good left to right collaterals and otherwise mild left system disease in the major epicardials with moderate branch vessel disease.  Plan is to continue medical therapy.  2.  Persistent atrial fibrillation.  Plan to continue Cardizem CD and increase Toprol-XL to 50 mg twice daily in an attempt to gain more optimal heart rate control in case this is contributing to his shortness of breath.  For stroke prophylaxis we will try Eliquis instead of Xarelto.  He had a spontaneous intramuscular hematoma in  the right gluteal area when on Xarelto which has resolved.  He will need to stop aspirin.  If he tolerates this, follow-up CBC and BMET for next visit.  3.  Essential hypertension, blood pressure adequately controlled today.  Current medicines were reviewed with the patient today.   Orders Placed This Encounter  Procedures  . CBC  . Basic Metabolic Panel (BMET)    Disposition: Follow-up in 3 months.  Signed, Satira Sark, MD, [facility] 02/10/2017 9:51 AM    [facility] [address], [place], [place] [address] Phone: [phone]; Fax: [phone]

## 2017-02-15 DIAGNOSIS — Z6833 Body mass index (BMI) 33.0-33.9, adult: Secondary | ICD-10-CM | POA: Diagnosis not present

## 2017-02-15 DIAGNOSIS — J209 Acute bronchitis, unspecified: Secondary | ICD-10-CM | POA: Diagnosis not present

## 2017-02-15 DIAGNOSIS — J019 Acute sinusitis, unspecified: Secondary | ICD-10-CM | POA: Diagnosis not present

## 2017-02-15 DIAGNOSIS — I482 Chronic atrial fibrillation: Secondary | ICD-10-CM | POA: Diagnosis not present

## 2017-02-21 DIAGNOSIS — J209 Acute bronchitis, unspecified: Secondary | ICD-10-CM | POA: Diagnosis not present

## 2017-02-21 DIAGNOSIS — I482 Chronic atrial fibrillation: Secondary | ICD-10-CM | POA: Diagnosis not present

## 2017-02-21 DIAGNOSIS — Z6833 Body mass index (BMI) 33.0-33.9, adult: Secondary | ICD-10-CM | POA: Diagnosis not present

## 2017-02-24 ENCOUNTER — Other Ambulatory Visit: Payer: Self-pay | Admitting: Cardiology

## 2017-02-24 ENCOUNTER — Emergency Department (HOSPITAL_COMMUNITY): Payer: Medicare Other

## 2017-02-24 ENCOUNTER — Other Ambulatory Visit: Payer: Self-pay

## 2017-02-24 ENCOUNTER — Emergency Department (HOSPITAL_COMMUNITY)
Admission: EM | Admit: 2017-02-24 | Discharge: 2017-02-24 | Disposition: A | Discharge status: Home / Self Care | Payer: Medicare Other | Attending: Emergency Medicine | Admitting: Emergency Medicine

## 2017-02-24 ENCOUNTER — Encounter (HOSPITAL_COMMUNITY): Payer: Self-pay

## 2017-02-24 DIAGNOSIS — Z87891 Personal history of nicotine dependence: Secondary | ICD-10-CM | POA: Diagnosis not present

## 2017-02-24 DIAGNOSIS — Z79899 Other long term (current) drug therapy: Secondary | ICD-10-CM | POA: Diagnosis not present

## 2017-02-24 DIAGNOSIS — J9801 Acute bronchospasm: Secondary | ICD-10-CM | POA: Diagnosis not present

## 2017-02-24 DIAGNOSIS — J209 Acute bronchitis, unspecified: Secondary | ICD-10-CM

## 2017-02-24 DIAGNOSIS — R05 Cough: Secondary | ICD-10-CM | POA: Diagnosis not present

## 2017-02-24 DIAGNOSIS — I2510 Atherosclerotic heart disease of native coronary artery without angina pectoris: Secondary | ICD-10-CM | POA: Insufficient documentation

## 2017-02-24 DIAGNOSIS — J40 Bronchitis, not specified as acute or chronic: Secondary | ICD-10-CM | POA: Diagnosis not present

## 2017-02-24 DIAGNOSIS — I4510 Unspecified right bundle-branch block: Secondary | ICD-10-CM | POA: Diagnosis not present

## 2017-02-24 MED ORDER — SODIUM CHLORIDE 0.9 % IN NEBU
INHALATION_SOLUTION | RESPIRATORY_TRACT | Status: AC
  Administered 2017-02-24: 3 mL
  Filled 2017-02-24: qty 3

## 2017-02-24 MED ORDER — PREDNISONE 20 MG PO TABS
40.0000 mg | ORAL_TABLET | Freq: Once | ORAL | Status: AC
  Administered 2017-02-24: 40 mg via ORAL
  Filled 2017-02-24: qty 2

## 2017-02-24 MED ORDER — APIXABAN 5 MG PO TABS: 5.0000 mg | ORAL_TABLET | Freq: Two times a day (BID) | ORAL | 6 refills | Status: DC

## 2017-02-24 MED ORDER — PREDNISONE 20 MG PO TABS: ORAL_TABLET | ORAL | 0 refills | Status: DC

## 2017-02-24 MED ORDER — LEVALBUTEROL HCL 1.25 MG/0.5ML IN NEBU
1.2500 mg | INHALATION_SOLUTION | Freq: Once | RESPIRATORY_TRACT | Status: AC
  Administered 2017-02-24: 1.25 mg via RESPIRATORY_TRACT
  Filled 2017-02-24: qty 0.5

## 2017-02-24 MED ORDER — DM-GUAIFENESIN ER 30-600 MG PO TB12
1.0000 | ORAL_TABLET | Freq: Two times a day (BID) | ORAL | Status: DC
  Administered 2017-02-24: 1 via ORAL
  Filled 2017-02-24: qty 1

## 2017-02-24 MED ORDER — DOXYCYCLINE HYCLATE 100 MG PO CAPS: 100.0000 mg | ORAL_CAPSULE | Freq: Two times a day (BID) | ORAL | 0 refills | Status: DC

## 2017-02-24 NOTE — ED Provider Notes (Signed)
[facility] EMERGENCY DEPARTMENT Provider Note   CSN: [id number] Arrival date & time: 02/24/17  0327  Time seen 03:55 AM   History   Chief Complaint Chief Complaint  Patient presents with  . Cough    HPI Richard [name] is a 83 y.o. male.  HPI patient states he gets wheezing every time he gets URI symptoms.  He states he has had a cough for the past 2 weeks.  He was seen by his primary care doctor on December 10 and was put on a unknown antibiotic.  He had been taking Mucinex and she told him to stop taking it.  However he was rechecked on the 17th and at that time he had had diarrhea for a couple of days.  He was told to stop the antibiotic and states he only had 2 pills left.  He does not know the name of the antibiotic.  On the second visit he also was given a nebulizer treatment in the office which he states seemed to help.  He has been using his nebulizer at home and he thought he was doing better.  However yesterday, December 19 he used his nebulizer 3 times without relief.  He states he has cough and sometimes has white mucus production.  He has some rhinorrhea that is white, he has rare sneezing.  He denies fever.  He states the diarrhea is gone.  He denies nausea or vomiting however tonight he had a coughing spell where he did have posttussive vomiting once.  He states his chest is sore from coughing.  He states he has wheezing tonight.  PCP Practice, [facility]   Past Medical History:  Diagnosis Date  . CAD (coronary artery disease)    Mild-moderate LM with occluded RCA (collaterals) - managed medically 2013  . GERD (gastroesophageal reflux disease)   . Hypertensive heart disease   . Obesity (BMI 30.0-34.9)   . Old inferior myocardial infarction 1995  . PAF (paroxysmal atrial fibrillation) ([place])   . Pulmonary embolus [facility])    [name] 2015    Patient Active Problem List   Diagnosis Date Noted  . Unstable angina ([place]) 01/21/2012  . Hypertensive heart  disease   . GERD (gastroesophageal reflux disease)   . Obesity (BMI 30.0-34.9)   . CAD (coronary artery disease)   . Old inferior myocardial infarction   . Hyperlipidemia     Past Surgical History:  Procedure Laterality Date  . ACHILLES TENDON REPAIR    . APPENDECTOMY    . LEFT HEART CATHETERIZATION WITH CORONARY ANGIOGRAM N/A 01/24/2012   Procedure: LEFT HEART CATHETERIZATION WITH CORONARY ANGIOGRAM;  Surgeon: Jacolyn Reedy, MD;  Location: [facility] CATH LAB;  Service: Cardiovascular;  Laterality: N/A;  . SHOULDER SURGERY         Home Medications    Prior to Admission medications   Medication Sig Start Date End Date Taking? Authorizing Provider  acetaminophen (TYLENOL) 500 MG tablet Take 1,000 mg every 6 (six) hours as needed by mouth for mild pain or headache.    [provider]  atorvastatin (LIPITOR) 10 MG tablet Take 10 mg by mouth every evening.    [provider]  diltiazem (CARDIZEM CD) 240 MG 24 hr capsule Take 240 mg by mouth daily.    [provider]  doxycycline (VIBRAMYCIN) 100 MG capsule Take 1 capsule (100 mg total) by mouth 2 (two) times daily. 02/24/17   Rolland Porter, MD  isosorbide mononitrate (IMDUR) 30 MG 24 hr tablet  Take 1 tablet (30 mg total) by mouth every evening. 12/02/16 03/02/17  Satira Sark, MD  losartan (COZAAR) 25 MG tablet Take 25 mg by mouth daily.    [provider]  metFORMIN (GLUCOPHAGE-XR) 500 MG 24 hr tablet Take 500 mg by mouth 2 (two) times daily.     [provider]  metoprolol succinate (TOPROL XL) 50 MG 24 hr tablet Take 1 tablet (50 mg total) by mouth 2 (two) times daily. Take with or immediately following a meal. 02/10/17   Satira Sark, MD  nitroGLYCERIN (NITROSTAT) 0.4 MG SL tablet Place 1 tablet (0.4 mg total) under the tongue every 5 (five) minutes x 3 doses as needed for chest pain. 12/24/16   Satira Sark, MD  predniSONE (DELTASONE) 20 MG tablet Take 2 po QD x 3 d then 1 po QD x  4d 02/24/17   Rolland Porter, MD    Family History Family History  Problem Relation Age of Onset  . Heart failure Mother     Social History Social History   Tobacco Use  . Smoking status: Former Smoker    Packs/day: 0.25    Last attempt to quit: 03/08/1993    Years since quitting: 23.9  . Smokeless tobacco: Never Used  Substance Use Topics  . Alcohol use: No  . Drug use: No  lives at home Lives with spouse   Allergies   Niaspan [niacin er]   Review of Systems Review of Systems  All other systems reviewed and are negative.    Physical Exam Updated Vital Signs BP (!) 145/91 (BP Location: Right Arm)   Pulse 85   Temp 98.6 F (37 C) (Oral)   Resp 20   Ht 5\' 8"  (1.727 m)   Wt 97.5 kg (215 lb)   SpO2 98%   BMI 32.69 kg/m   Vital signs normal    Physical Exam  Constitutional: He is oriented to person, place, and time. He appears well-developed and well-nourished.  Non-toxic appearance. He does not appear ill. No distress.  HENT:  Head: Normocephalic and atraumatic.  Right Ear: External ear normal.  Left Ear: External ear normal.  Nose: Nose normal. No mucosal edema or rhinorrhea.  Mouth/Throat: Oropharynx is clear and moist and mucous membranes are normal. No dental abscesses or uvula swelling.  Eyes: Conjunctivae and EOM are normal. Pupils are equal, round, and reactive to light.  Neck: Normal range of motion and full passive range of motion without pain. Neck supple.  Cardiovascular: Normal rate, regular rhythm and normal heart sounds. Exam reveals no gallop and no friction rub.  No murmur heard. Pulmonary/Chest: Effort normal. No tachypnea. No respiratory distress. He has decreased breath sounds. He has no wheezes. He has no rhonchi. He has no rales. He exhibits no tenderness and no crepitus.  Abdominal: Soft. Normal appearance and bowel sounds are normal. He exhibits no distension. There is no tenderness. There is no rebound and no guarding.  Musculoskeletal:  Normal range of motion. He exhibits no edema or tenderness.  Moves all extremities well.   Neurological: He is alert and oriented to person, place, and time. He has normal strength. No cranial nerve deficit.  Skin: Skin is warm, dry and intact. No rash noted. No erythema. No pallor.  Psychiatric: He has a normal mood and affect. His speech is normal and behavior is normal. His mood appears not anxious.  Nursing note and vitals reviewed.     ED Treatments / Results  Labs (  all labs ordered are listed, but only abnormal results are displayed) Labs Reviewed - No data to display  EKG  EKG Interpretation  Date/Time:  Thursday February 24 2017 03:47:16 EST Ventricular Rate:  79 PR Interval:    QRS Duration: 115 QT Interval:  375 QTC Calculation: 430 R Axis:   49 Text Interpretation:  Atrial fibrillation Incomplete right bundle branch block Since last tracing rate faster 13 Jan 2017 Confirmed by Rolland Porter [phone]) on 02/24/2017 3:50:54 AM       Radiology Dg Chest 2 View  Result Date: 02/24/2017 CLINICAL DATA:  Cough EXAM: CHEST  2 VIEW COMPARISON:  Chest radiograph 11/18/2016 FINDINGS: The heart size and mediastinal contours are within normal limits. Unchanged mild basilar predominant scarring. No focal airspace consolidation or pulmonary edema. The visualized skeletal structures are unremarkable. IMPRESSION: No active cardiopulmonary disease. Electronically Signed   By: Ulyses Jarred M.D.   On: 02/24/2017 05:35    Procedures Procedures (including critical care time)  Jan 13, 2017 Burnell Blanks, MD (Primary)    Procedures   RIGHT HEART CATH AND CORONARY ANGIOGRAPHY  Conclusion     Prox RCA lesion is 100% stenosed.  Ost 1st Mrg to 1st Mrg lesion is 60% stenosed.  Ost Cx to Prox Cx lesion is 20% stenosed.  Ost 2nd Mrg lesion is 50% stenosed.  Prox LAD lesion is 30% stenosed.  Ost 1st Diag to 1st Diag lesion is 40% stenosed.  Ost LM lesion is 20%  stenosed.   1. Chronic occlusion RCA with brisk collateral filling from left to right collaterals. This is known from cath in 2013.  2. Mild non-obstructive plaque in the left main and LAD. The second obtuse marginal branch has a moderate stenosis that does not appear to be flow limiting.  3. I did not cross the aortic valve due to radial artery spasm at the end of the case.   Recommendations: Continue medical management of CAD.       Medications Ordered in ED Medications  dextromethorphan-guaiFENesin (MUCINEX DM) 30-600 MG per 12 hr tablet 1 tablet (1 tablet Oral Given 02/24/17 0628)  levalbuterol (XOPENEX) nebulizer solution 1.25 mg (1.25 mg Nebulization Given 02/24/17 0433)  predniSONE (DELTASONE) tablet 40 mg (40 mg Oral Given 02/24/17 0412)  sodium chloride 0.9 % nebulizer solution (3 mLs  Given 02/24/17 0433)  levalbuterol (XOPENEX) nebulizer solution 1.25 mg (1.25 mg Nebulization Given 02/24/17 0626)  sodium chloride 0.9 % nebulizer solution (3 mLs  Given 02/24/17 0626)     Initial Impression / Assessment and Plan / ED Course  I have reviewed the triage vital signs and the nursing notes.  Pertinent labs & imaging results that were available during my care of the patient were reviewed by me and considered in my medical decision making (see chart for details).    Review of patient's chart shows he has been in atrial fibrillation.  His cardiologist has been trying to adjust his medications to see if lowering his heart rate will help with his dyspnea on exertion.  Patient was given Xopenex nebulizer treatment for his wheezing, because of his underlying atrial fibrillation I did not want to make him have a high heart rate which albuterol can do.  Chest x-ray was ordered.  Check at 5:50 AM patient has mild improvement of air movement however he now has a few scattered wheezing.  Second Xopenex treatment ordered.  Patient was given Mucinex DM p.o.  Recheck at 7 AM patient states  his wheezing  is better, he has improved air movement, I do not hear any wheezing now.  We discussed going home on low-dose steroids because he is already on a blood thinner and I am concerned about risk of bleeding, I am going to put him on doxycycline, it can be given safely with his blood thinner.  He was advised to start the Mucinex DM.  He should be rechecked if he gets a high fever he said feels like he is getting worse instead of better.  Final Clinical Impressions(s) / ED Diagnoses   Final diagnoses:  Bronchitis with bronchospasm    ED Discharge Orders        Ordered    predniSONE (DELTASONE) 20 MG tablet     02/24/17 0703    doxycycline (VIBRAMYCIN) 100 MG capsule  2 times daily     02/24/17 0703    albuterol nebulizer  Plan discharge  Rolland Porter, MD, Barbette Or, MD 02/24/17 [phone]

## 2017-02-24 NOTE — Telephone Encounter (Signed)
Pt had been given Eliquis samples at last visit, now request rx to [facility]

## 2017-02-24 NOTE — Discharge Instructions (Signed)
Drink plenty of fluids. Take mucinex DM for cough. Take the antibiotic (doxycycline) until gone. Take the prednisone which will help with your wheezing, take it with food. Use your nebulizer every 4 hrs as needed for wheezing and shortness of breath. Recheck if you get a fever, struggle to breathe despite using your inhaler or you feel like you are getting worse.

## 2017-02-24 NOTE — ED Triage Notes (Signed)
Persistent cough for over a week, states has been to his doctor with no relief.  Pt states his chest hurts from coughing

## 2017-02-24 NOTE — Telephone Encounter (Signed)
Needs RX for Eliquis sent to [facility] / tg

## 2017-03-11 DIAGNOSIS — I10 Essential (primary) hypertension: Secondary | ICD-10-CM | POA: Diagnosis not present

## 2017-03-11 DIAGNOSIS — I482 Chronic atrial fibrillation: Secondary | ICD-10-CM | POA: Diagnosis not present

## 2017-03-11 DIAGNOSIS — E7800 Pure hypercholesterolemia, unspecified: Secondary | ICD-10-CM | POA: Diagnosis not present

## 2017-03-11 DIAGNOSIS — I259 Chronic ischemic heart disease, unspecified: Secondary | ICD-10-CM | POA: Diagnosis not present

## 2017-03-11 DIAGNOSIS — K210 Gastro-esophageal reflux disease with esophagitis: Secondary | ICD-10-CM | POA: Diagnosis not present

## 2017-03-11 DIAGNOSIS — E1165 Type 2 diabetes mellitus with hyperglycemia: Secondary | ICD-10-CM | POA: Diagnosis not present

## 2017-03-16 DIAGNOSIS — I259 Chronic ischemic heart disease, unspecified: Secondary | ICD-10-CM | POA: Diagnosis not present

## 2017-03-16 DIAGNOSIS — I10 Essential (primary) hypertension: Secondary | ICD-10-CM | POA: Diagnosis not present

## 2017-03-16 DIAGNOSIS — E1165 Type 2 diabetes mellitus with hyperglycemia: Secondary | ICD-10-CM | POA: Diagnosis not present

## 2017-03-16 DIAGNOSIS — I482 Chronic atrial fibrillation: Secondary | ICD-10-CM | POA: Diagnosis not present

## 2017-03-16 DIAGNOSIS — Z6833 Body mass index (BMI) 33.0-33.9, adult: Secondary | ICD-10-CM | POA: Diagnosis not present

## 2017-05-11 ENCOUNTER — Other Ambulatory Visit (HOSPITAL_COMMUNITY)
Admission: RE | Admit: 2017-05-11 | Discharge: 2017-05-11 | Disposition: A | Discharge status: Home / Self Care | Payer: Medicare Other | Source: Ambulatory Visit | Attending: Cardiology | Admitting: Cardiology

## 2017-05-11 DIAGNOSIS — I2510 Atherosclerotic heart disease of native coronary artery without angina pectoris: Secondary | ICD-10-CM | POA: Insufficient documentation

## 2017-05-11 LAB — BASIC METABOLIC PANEL
Anion gap: 11 (ref 5–15)
BUN: 18 mg/dL (ref 6–20)
CALCIUM: 8.5 mg/dL — AB (ref 8.9–10.3)
CO2: 26 mmol/L (ref 22–32)
CREATININE: 1.02 mg/dL (ref 0.61–1.24)
Chloride: 102 mmol/L (ref 101–111)
GFR calc non Af Amer: >60 mL/min (ref >60)
Glucose, Bld: 191 mg/dL — ABNORMAL HIGH (ref 65–99)
Potassium: 4.2 mmol/L (ref 3.5–5.1)
SODIUM: 139 mmol/L (ref 135–145)

## 2017-05-11 LAB — CBC
HCT: 45.5 % (ref 39.0–52.0)
Hemoglobin: 14.5 g/dL (ref 13.0–17.0)
MCH: 30.5 pg (ref 26.0–34.0)
MCHC: 31.9 g/dL (ref 30.0–36.0)
MCV: 95.8 fL (ref 78.0–100.0)
Platelets: 156 10*3/uL (ref 150–400)
RBC: 4.75 MIL/uL (ref 4.22–5.81)
RDW: 13.9 % (ref 11.5–15.5)
WBC: 6.9 10*3/uL (ref 4.0–10.5)

## 2017-05-12 NOTE — Progress Notes (Signed)
Cardiology Office Note  Date: 05/13/2017   ID: Richard [name], DOB March 01, 1934, MRN [id number]  PCP: Practice, [facility]  Primary Cardiologist: Rozann Lesches, MD   Chief Complaint  Patient presents with  . Coronary Artery Disease  . Atrial Fibrillation    History of Present Illness: Richard [name] is an 84 y.o. male last seen in December 2018.  He presents today for a follow-up visit.  Reports no change in exertional dyspnea, remains functional with ADLs.  He does not report any active angina, does have intermittent sense of palpitations.  He has had no falls or syncope.  He has been tolerating Eliquis, no obvious bleeding problems.  Heart rate control regimen includes Cartia XT 240 mg daily and Toprol-XL 50 mg twice daily.  Resting heart rate is in the 80s today on my check.  I reviewed his recent follow-up lab work which is outlined below.  Hemoglobin and renal function were normal.  Past Medical History:  Diagnosis Date  . CAD (coronary artery disease)    Mild-moderate LM with occluded RCA (collaterals) - managed medically 2013  . GERD (gastroesophageal reflux disease)   . Hypertensive heart disease   . Obesity (BMI 30.0-34.9)   . Old inferior myocardial infarction 1995  . PAF (paroxysmal atrial fibrillation) ([place])   . Pulmonary embolus ([place])    [name] 2015    Past Surgical History:  Procedure Laterality Date  . ACHILLES TENDON REPAIR    . APPENDECTOMY    . LEFT HEART CATHETERIZATION WITH CORONARY ANGIOGRAM N/A 01/24/2012   Procedure: LEFT HEART CATHETERIZATION WITH CORONARY ANGIOGRAM;  Surgeon: Jacolyn Reedy, MD;  Location: [facility] CATH LAB;  Service: Cardiovascular;  Laterality: N/A;  . SHOULDER SURGERY      Current Outpatient Medications  Medication Sig Dispense Refill  . acetaminophen (TYLENOL) 500 MG tablet Take 1,000 mg every 6 (six) hours as needed by mouth for mild pain or headache.    Marland Kitchen apixaban (ELIQUIS) 5 MG TABS tablet Take 1 tablet (5  mg total) by mouth 2 (two) times daily. 60 tablet 6  . atorvastatin (LIPITOR) 10 MG tablet Take 10 mg by mouth every evening.    . isosorbide mononitrate (IMDUR) 30 MG 24 hr tablet Take 1 tablet (30 mg total) by mouth every evening. 30 tablet 6  . losartan (COZAAR) 25 MG tablet Take 25 mg by mouth daily.    . metFORMIN (GLUCOPHAGE-XR) 500 MG 24 hr tablet Take 500 mg by mouth 2 (two) times daily.     . metoprolol succinate (TOPROL XL) 50 MG 24 hr tablet Take 1 tablet (50 mg total) by mouth 2 (two) times daily. Take with or immediately following a meal. 180 tablet 3  . nitroGLYCERIN (NITROSTAT) 0.4 MG SL tablet Place 1 tablet (0.4 mg total) under the tongue every 5 (five) minutes x 3 doses as needed for chest pain. 25 tablet 3   No current facility-administered medications for this visit.    Allergies:  Niaspan [niacin er]   Social History: The patient  reports that he quit smoking about 24 years ago. He smoked 0.25 packs per day. he has never used smokeless tobacco. He reports that he does not drink alcohol or use drugs.   Family History: The patient's family history includes Heart failure in his mother.   ROS:  Please see the history of present illness. Otherwise, complete review of systems is positive for hearing loss.  All other systems are reviewed and negative.  Physical Exam: VS:  BP 114/72   Pulse 73   Ht 5\' 8"  (1.727 m)   Wt 214 lb (97.1 kg)   SpO2 97%   BMI 32.54 kg/m , BMI Body mass index is 32.54 kg/m.  Wt Readings from Last 3 Encounters:  05/13/17 214 lb (97.1 kg)  02/24/17 215 lb (97.5 kg)  02/10/17 216 lb (98 kg)    General: Elderly male, appears comfortable at rest. HEENT: Conjunctiva and lids normal, oropharynx clear. Neck: Supple, no elevated JVP or carotid bruits, no thyromegaly. Lungs: Clear to auscultation, nonlabored breathing at rest. Cardiac: Irregularly irregular, no S3, 1/6 systolic murmur, no pericardial rub. Abdomen: Soft, nontender, bowel sounds  present. Extremities: No pitting edema, distal pulses 2+. Skin: Warm and dry. Musculoskeletal: No kyphosis. Neuropsychiatric: Alert and oriented x3, affect grossly appropriate.  ECG: I personally reviewed the tracing from 02/24/2017 which showed rate controlled atrial fibrillation with R' in lead V1 and V2.  Recent Labwork: 05/11/2017: BUN 18; Creatinine, Ser 1.02; Hemoglobin 14.5; Platelets 156; Potassium 4.2; Sodium 139   Other Studies Reviewed Today:  Right and left heart catheterization 01/13/2017:  Prox RCA lesion is 100% stenosed.  Ost 1st Mrg to 1st Mrg lesion is 60% stenosed.  Ost Cx to Prox Cx lesion is 20% stenosed.  Ost 2nd Mrg lesion is 50% stenosed.  Prox LAD lesion is 30% stenosed.  Ost 1st Diag to 1st Diag lesion is 40% stenosed.  Ost LM lesion is 20% stenosed.  1. Chronic occlusion RCA with brisk collateral filling from left to right collaterals. This is known from cath in 2013.  2. Mild non-obstructive plaque in the left main and LAD. The second obtuse marginal branch has a moderate stenosis that does not appear to be flow limiting.  3. I did not cross the aortic valve due to radial artery spasm at the end of the case.   Fick Cardiac Output 4.69 L/min  Fick Cardiac Output Index 2.22 (L/min)/BSA  RA A Wave 6 mmHg  RA V Wave 9 mmHg  RA Mean 6 mmHg  RV Systolic Pressure 30 mmHg  RV Diastolic Pressure 2 mmHg  RV EDP 5 mmHg  PA Systolic Pressure 34 mmHg  PA Diastolic Pressure 10 mmHg  PA Mean 20 mmHg  PW A Wave 14 mmHg  PW V Wave 14 mmHg  PW Mean 11 mmHg  AO Systolic Pressure 341 mmHg  AO Diastolic Pressure 82 mmHg  AO Mean 106 mmHg  QP/QS 1  TPVR Index 9 HRUI  TSVR Index 47.72 HRUI  PVR SVR Ratio 0.09  TPVR/TSVR Ratio 0.19   Recommendations: Continue medical management of CAD.   Assessment and Plan:  1.  Persistent atrial fibrillation.  Continue strategy of heart rate control and anticoagulation.  Changes were made in present medications.  He  is tolerating Eliquis and recent lab work was stable.  Plan follow-up in 6 months with repeat CBC and BMET.  2.  Symptomatically stable CAD with chronic occlusion of the RCA associated with left-to-right collaterals and otherwise mild to moderate left system disease.  No progressive angina symptoms.  Continue medical therapy.  3.  Essential hypertension, blood pressure is well controlled today.  4.  Mixed hyperlipidemia, on statin therapy.  Keep follow-up with [facility].  Current medicines were reviewed with the patient today.   Orders Placed This Encounter  Procedures  . CBC  . Basic Metabolic Panel (BMET)    Disposition: Follow-up in 6 months.  Signed, Satira Sark, MD, [facility]  05/13/2017 8:57 AM    [facility] [address] [place], [place] [address] Phone: [phone]; Fax: [phone]

## 2017-05-13 ENCOUNTER — Ambulatory Visit (INDEPENDENT_AMBULATORY_CARE_PROVIDER_SITE_OTHER): Payer: Medicare Other | Admitting: Cardiology

## 2017-05-13 ENCOUNTER — Encounter: Payer: Self-pay | Admitting: Cardiology

## 2017-05-13 VITALS — BP 114/72 | HR 73 | Ht 68.0 in | Wt 214.0 lb

## 2017-05-13 DIAGNOSIS — E782 Mixed hyperlipidemia: Secondary | ICD-10-CM | POA: Diagnosis not present

## 2017-05-13 DIAGNOSIS — I10 Essential (primary) hypertension: Secondary | ICD-10-CM | POA: Diagnosis not present

## 2017-05-13 DIAGNOSIS — I25119 Atherosclerotic heart disease of native coronary artery with unspecified angina pectoris: Secondary | ICD-10-CM | POA: Diagnosis not present

## 2017-05-13 DIAGNOSIS — Z79899 Other long term (current) drug therapy: Secondary | ICD-10-CM | POA: Diagnosis not present

## 2017-05-13 DIAGNOSIS — I481 Persistent atrial fibrillation: Secondary | ICD-10-CM

## 2017-05-13 DIAGNOSIS — I4819 Other persistent atrial fibrillation: Secondary | ICD-10-CM

## 2017-05-13 MED ORDER — DILTIAZEM HCL ER COATED BEADS 240 MG PO CP24: 240.0000 mg | ORAL_CAPSULE | Freq: Every day | ORAL | 3 refills | Status: AC

## 2017-05-13 NOTE — Patient Instructions (Addendum)
Your physician wants you to follow-up in:6 months with Dr.McDowell You will receive a reminder letter in the mail two months in advance. If you don't receive a letter, please call our office to schedule the follow-up appointment.      Your physician recommends that you continue on your current medications as directed. Please refer to the Current Medication list given to you today.    If you need a refill on your cardiac medications before your next appointment, please call your pharmacy.     Please get Lab work Cytogeneticist BEFORE NEXT VISIT IN 6 MONTHS: CBC,BMET    No tests ordered today     Thank you for choosing [facility] !

## 2017-06-23 DIAGNOSIS — E1165 Type 2 diabetes mellitus with hyperglycemia: Secondary | ICD-10-CM | POA: Diagnosis not present

## 2017-06-23 DIAGNOSIS — Z0000 Encounter for general adult medical examination without abnormal findings: Secondary | ICD-10-CM | POA: Diagnosis not present

## 2017-06-23 DIAGNOSIS — Z6833 Body mass index (BMI) 33.0-33.9, adult: Secondary | ICD-10-CM | POA: Diagnosis not present

## 2017-06-24 ENCOUNTER — Other Ambulatory Visit: Payer: Self-pay | Admitting: Cardiology

## 2017-07-22 DIAGNOSIS — D485 Neoplasm of uncertain behavior of skin: Secondary | ICD-10-CM | POA: Diagnosis not present

## 2017-07-22 DIAGNOSIS — L570 Actinic keratosis: Secondary | ICD-10-CM | POA: Diagnosis not present

## 2017-07-22 DIAGNOSIS — L819 Disorder of pigmentation, unspecified: Secondary | ICD-10-CM | POA: Diagnosis not present

## 2017-07-29 DIAGNOSIS — L570 Actinic keratosis: Secondary | ICD-10-CM | POA: Diagnosis not present

## 2017-08-18 DIAGNOSIS — M79672 Pain in left foot: Secondary | ICD-10-CM | POA: Diagnosis not present

## 2017-08-18 DIAGNOSIS — M2042 Other hammer toe(s) (acquired), left foot: Secondary | ICD-10-CM | POA: Diagnosis not present

## 2017-08-18 DIAGNOSIS — M79671 Pain in right foot: Secondary | ICD-10-CM | POA: Diagnosis not present

## 2017-08-18 DIAGNOSIS — M2041 Other hammer toe(s) (acquired), right foot: Secondary | ICD-10-CM | POA: Diagnosis not present

## 2017-09-14 ENCOUNTER — Other Ambulatory Visit: Payer: Self-pay | Admitting: Cardiology

## 2017-10-06 ENCOUNTER — Other Ambulatory Visit: Payer: Self-pay

## 2017-10-13 DIAGNOSIS — R42 Dizziness and giddiness: Secondary | ICD-10-CM | POA: Diagnosis not present

## 2017-10-13 DIAGNOSIS — I482 Chronic atrial fibrillation: Secondary | ICD-10-CM | POA: Diagnosis not present

## 2017-10-13 DIAGNOSIS — Z6832 Body mass index (BMI) 32.0-32.9, adult: Secondary | ICD-10-CM | POA: Diagnosis not present

## 2017-10-13 DIAGNOSIS — G629 Polyneuropathy, unspecified: Secondary | ICD-10-CM | POA: Diagnosis not present

## 2017-10-13 DIAGNOSIS — E1165 Type 2 diabetes mellitus with hyperglycemia: Secondary | ICD-10-CM | POA: Diagnosis not present

## 2017-10-20 DIAGNOSIS — M79671 Pain in right foot: Secondary | ICD-10-CM | POA: Diagnosis not present

## 2017-10-20 DIAGNOSIS — M79672 Pain in left foot: Secondary | ICD-10-CM | POA: Diagnosis not present

## 2017-10-20 DIAGNOSIS — B351 Tinea unguium: Secondary | ICD-10-CM | POA: Diagnosis not present

## 2017-11-01 ENCOUNTER — Other Ambulatory Visit (HOSPITAL_COMMUNITY)
Admission: RE | Admit: 2017-11-01 | Discharge: 2017-11-01 | Disposition: A | Discharge status: Home / Self Care | Payer: Medicare Other | Source: Ambulatory Visit | Attending: Cardiology | Admitting: Cardiology

## 2017-11-01 DIAGNOSIS — L578 Other skin changes due to chronic exposure to nonionizing radiation: Secondary | ICD-10-CM | POA: Diagnosis not present

## 2017-11-01 DIAGNOSIS — Z79899 Other long term (current) drug therapy: Secondary | ICD-10-CM | POA: Insufficient documentation

## 2017-11-01 DIAGNOSIS — I481 Persistent atrial fibrillation: Secondary | ICD-10-CM | POA: Diagnosis not present

## 2017-11-01 DIAGNOSIS — L570 Actinic keratosis: Secondary | ICD-10-CM | POA: Diagnosis not present

## 2017-11-01 LAB — CBC
HCT: 40.7 % (ref 39.0–52.0)
Hemoglobin: 13.5 g/dL (ref 13.0–17.0)
MCH: 32.3 pg (ref 26.0–34.0)
MCHC: 33.2 g/dL (ref 30.0–36.0)
MCV: 97.4 fL (ref 78.0–100.0)
PLATELETS: 115 10*3/uL — AB (ref 150–400)
RBC: 4.18 MIL/uL — ABNORMAL LOW (ref 4.22–5.81)
RDW: 13.8 % (ref 11.5–15.5)
WBC: 6.9 10*3/uL (ref 4.0–10.5)

## 2017-11-01 LAB — BASIC METABOLIC PANEL
ANION GAP: 9 (ref 5–15)
BUN: 16 mg/dL (ref 8–23)
CALCIUM: 8.7 mg/dL — AB (ref 8.9–10.3)
CO2: 28 mmol/L (ref 22–32)
Chloride: 103 mmol/L (ref 98–111)
Creatinine, Ser: 0.91 mg/dL (ref 0.61–1.24)
GFR calc Af Amer: >60 mL/min (ref >60)
GFR calc non Af Amer: >60 mL/min (ref >60)
GLUCOSE: 269 mg/dL — AB (ref 70–99)
Potassium: 3.9 mmol/L (ref 3.5–5.1)
SODIUM: 140 mmol/L (ref 135–145)

## 2017-11-09 ENCOUNTER — Encounter: Payer: Self-pay | Admitting: Cardiology

## 2017-11-09 ENCOUNTER — Ambulatory Visit (INDEPENDENT_AMBULATORY_CARE_PROVIDER_SITE_OTHER): Payer: Medicare Other | Admitting: Cardiology

## 2017-11-09 VITALS — BP 108/72 | HR 64 | Ht 68.0 in | Wt 213.0 lb

## 2017-11-09 DIAGNOSIS — I482 Chronic atrial fibrillation: Secondary | ICD-10-CM

## 2017-11-09 DIAGNOSIS — I4821 Permanent atrial fibrillation: Secondary | ICD-10-CM

## 2017-11-09 DIAGNOSIS — I10 Essential (primary) hypertension: Secondary | ICD-10-CM

## 2017-11-09 DIAGNOSIS — Z79899 Other long term (current) drug therapy: Secondary | ICD-10-CM

## 2017-11-09 DIAGNOSIS — E782 Mixed hyperlipidemia: Secondary | ICD-10-CM | POA: Diagnosis not present

## 2017-11-09 DIAGNOSIS — I25119 Atherosclerotic heart disease of native coronary artery with unspecified angina pectoris: Secondary | ICD-10-CM | POA: Diagnosis not present

## 2017-11-09 NOTE — Patient Instructions (Addendum)
Your physician wants you to follow-up in: 6 months  with Dr.McDowell You will receive a reminder letter in the mail two months in advance. If you don't receive a letter, please call our office to schedule the follow-up appointment.    JUST BEFORE NEXT VISIT: GET LAB WORK:  CBC, BMET    Your physician recommends that you continue on your current medications as directed. Please refer to the Current Medication list given to you today.     If you need a refill on your cardiac medications before your next appointment, please call your pharmacy.     No tests today     Thank you for choosing [facility] !

## 2017-11-09 NOTE — Progress Notes (Signed)
Cardiology Office Note  Date: 11/09/2017   ID: Richard [name], DOB 01/21/1934, MRN [id number]  PCP: [facility]  Primary Cardiologist: Rozann Lesches, MD   Chief Complaint  Patient presents with  . Atrial Fibrillation    History of Present Illness: Richard [name] is an 84 y.o. male last seen in March.  He is here with his wife for a follow-up visit.  Still reports chronic dyspnea on exertion, occasional palpitations, but no exertional chest pain.  We went over his medications, he reports compliance and no obvious intolerances.  Heart rate is adequately controlled today at rest on combination of Toprol-XL and Cardizem CD.  He has had no bleeding problems on Eliquis.  Recent lab work is reviewed below showing stable renal function and normal hemoglobin.  He underwent cardiac catheterization last year that revealed a chronically occluded RCA with left-to-right collaterals and otherwise mild to moderate nonobstructive left system disease.  Medical therapy was recommended.  Past Medical History:  Diagnosis Date  . CAD (coronary artery disease)    Mild-moderate LM with occluded RCA (collaterals) - managed medically 2013  . GERD (gastroesophageal reflux disease)   . Hypertensive heart disease   . Obesity (BMI 30.0-34.9)   . Old inferior myocardial infarction 1995  . PAF (paroxysmal atrial fibrillation) ([place])   . Pulmonary embolus ([place])    [name] 2015    Past Surgical History:  Procedure Laterality Date  . ACHILLES TENDON REPAIR    . APPENDECTOMY    . LEFT HEART CATHETERIZATION WITH CORONARY ANGIOGRAM N/A 01/24/2012   Procedure: LEFT HEART CATHETERIZATION WITH CORONARY ANGIOGRAM;  Surgeon: Jacolyn Reedy, MD;  Location: [facility] CATH LAB;  Service: Cardiovascular;  Laterality: N/A;  . SHOULDER SURGERY      Current Outpatient Medications  Medication Sig Dispense Refill  . acetaminophen (TYLENOL) 500 MG tablet Take 1,000 mg every 6 (six) hours as needed by  mouth for mild pain or headache.    Marland Kitchen apixaban (ELIQUIS) 5 MG TABS tablet Take 1 tablet (5 mg total) by mouth 2 (two) times daily. 60 tablet 6  . atorvastatin (LIPITOR) 10 MG tablet Take 10 mg by mouth every evening.    . diltiazem (CARDIZEM CD) 240 MG 24 hr capsule Take 1 capsule (240 mg total) by mouth daily. 90 capsule 3  . gabapentin (NEURONTIN) 300 MG capsule Take 300 mg by mouth 3 (three) times daily.  6  . isosorbide mononitrate (IMDUR) 30 MG 24 hr tablet TAKE 1 TABLET BY MOUTH EVERY EVENING 90 tablet 2  . losartan (COZAAR) 25 MG tablet TAKE 1 TABLET BY MOUTH ONCE DAILY 90 tablet 3  . meclizine (ANTIVERT) 12.5 MG tablet TAKE 1 TABLET BY MOUTH EVERY 4 TO 6 HOURS AS NEEDED FOR DIZZINESS  0  . metFORMIN (GLUCOPHAGE-XR) 500 MG 24 hr tablet Take 500 mg by mouth 2 (two) times daily.     . metoprolol succinate (TOPROL XL) 50 MG 24 hr tablet Take 1 tablet (50 mg total) by mouth 2 (two) times daily. Take with or immediately following a meal. 180 tablet 3  . nitroGLYCERIN (NITROSTAT) 0.4 MG SL tablet Place 1 tablet (0.4 mg total) under the tongue every 5 (five) minutes x 3 doses as needed for chest pain. 25 tablet 3  . terbinafine (LAMISIL) 250 MG tablet Take 250 mg by mouth daily.  0   No current facility-administered medications for this visit.    Allergies:  Niaspan [niacin er]   Social History: The  patient  reports that he quit smoking about 24 years ago. He smoked 0.25 packs per day. He has never used smokeless tobacco. He reports that he does not drink alcohol or use drugs.   ROS:  Please see the history of present illness. Otherwise, complete review of systems is positive for hearing loss.  All other systems are reviewed and negative.   Physical Exam: VS:  BP 108/72   Pulse 64   Ht 5\' 8"  (1.727 m)   Wt 213 lb (96.6 kg)   SpO2 97%   BMI 32.39 kg/m , BMI Body mass index is 32.39 kg/m.  Wt Readings from Last 3 Encounters:  11/09/17 213 lb (96.6 kg)  05/13/17 214 lb (97.1 kg)    02/24/17 215 lb (97.5 kg)    General: Elderly male, appears comfortable at rest. HEENT: Conjunctiva and lids normal, oropharynx clear. Neck: Supple, no elevated JVP or carotid bruits, no thyromegaly. Lungs: Clear to auscultation, nonlabored breathing at rest. Cardiac: Irregularly irregular, no S3, soft systolic murmur. Abdomen: Soft, nontender, bowel sounds present. Extremities: No pitting edema, distal pulses 2+. Skin: Warm and dry. Musculoskeletal: No kyphosis. Neuropsychiatric: Alert and oriented x3, affect grossly appropriate.  ECG: I personally reviewed the tracing from 02/24/2017 which showed rate controlled atrial fibrillation with R' in lead V1 and V2.  Recent Labwork: 11/01/2017: BUN 16; Creatinine, Ser 0.91; Hemoglobin 13.5; Platelets 115; Potassium 3.9; Sodium 140     Component Value Date/Time   CHOL 141 01/22/2012 0515   TRIG 122 01/22/2012 0515   HDL 36 (L) 01/22/2012 0515   CHOLHDL 3.9 01/22/2012 0515   VLDL 24 01/22/2012 0515   [place] 81 01/22/2012 0515    Other Studies Reviewed Today:  Right and left heart catheterization 01/13/2017:  Prox RCA lesion is 100% stenosed.  Ost 1st Mrg to 1st Mrg lesion is 60% stenosed.  Ost Cx to Prox Cx lesion is 20% stenosed.  Ost 2nd Mrg lesion is 50% stenosed.  Prox LAD lesion is 30% stenosed.  Ost 1st Diag to 1st Diag lesion is 40% stenosed.  Ost LM lesion is 20% stenosed.  1. Chronic occlusion RCA with brisk collateral filling from left to right collaterals. This is known from cath in 2013.  2. Mild non-obstructive plaque in the left main and LAD. The second obtuse marginal branch has a moderate stenosis that does not appear to be flow limiting.  3. I did not cross the aortic valve due to radial artery spasm at the end of the case.   Fick Cardiac Output 4.69 L/min  Fick Cardiac Output Index 2.22 (L/min)/BSA  RA A Wave 6 mmHg  RA V Wave 9 mmHg  RA Mean 6 mmHg  RV Systolic Pressure 30 mmHg  RV Diastolic  Pressure 2 mmHg  RV EDP 5 mmHg  PA Systolic Pressure 34 mmHg  PA Diastolic Pressure 10 mmHg  PA Mean 20 mmHg  PW A Wave 14 mmHg  PW V Wave 14 mmHg  PW Mean 11 mmHg  AO Systolic Pressure 440 mmHg  AO Diastolic Pressure 82 mmHg  AO Mean 106 mmHg  QP/QS 1  TPVR Index 9 HRUI  TSVR Index 47.72 HRUI  PVR SVR Ratio 0.09  TPVR/TSVR Ratio 0.19   Recommendations: Continue medical management of CAD.  Assessment and Plan:  1.  Permanent atrial fibrillation.  Plan is to continue strategy of heart rate control and anticoagulation.  He is tolerating Eliquis without obvious bleeding problems.  Follow-up CBC and BMET for 75-month visit.  2.  CAD with known chronic occlusion of the RCA associated with left-to-right collaterals and otherwise mild to moderate left system disease.  Continue with medical therapy and observation.  He is not on aspirin due to use of Eliquis, but does continue on beta-blocker and statin.  3.  Mixed hyperlipidemia, continues on statin therapy with follow-up per [facility].  4.  Essential hypertension, blood pressure is very well controlled today.  Current medicines were reviewed with the patient today.   Orders Placed This Encounter  Procedures  . CBC  . Basic Metabolic Panel (BMET)    Disposition: Follow-up in 6 months.  Signed, Satira Sark, MD, [facility] 11/09/2017 2:46 PM    [facility] at [facility] [address], [place],  [address] Phone: [phone]; Fax: [phone]

## 2017-11-17 DIAGNOSIS — M79674 Pain in right toe(s): Secondary | ICD-10-CM | POA: Diagnosis not present

## 2017-11-17 DIAGNOSIS — B351 Tinea unguium: Secondary | ICD-10-CM | POA: Diagnosis not present

## 2017-11-17 DIAGNOSIS — M79675 Pain in left toe(s): Secondary | ICD-10-CM | POA: Diagnosis not present

## 2017-12-14 DIAGNOSIS — Z23 Encounter for immunization: Secondary | ICD-10-CM | POA: Diagnosis not present

## 2017-12-15 DIAGNOSIS — L600 Ingrowing nail: Secondary | ICD-10-CM | POA: Diagnosis not present

## 2017-12-15 DIAGNOSIS — B351 Tinea unguium: Secondary | ICD-10-CM | POA: Diagnosis not present

## 2017-12-15 DIAGNOSIS — M79674 Pain in right toe(s): Secondary | ICD-10-CM | POA: Diagnosis not present

## 2017-12-15 DIAGNOSIS — M79671 Pain in right foot: Secondary | ICD-10-CM | POA: Diagnosis not present

## 2017-12-19 DIAGNOSIS — E1165 Type 2 diabetes mellitus with hyperglycemia: Secondary | ICD-10-CM | POA: Diagnosis not present

## 2017-12-19 DIAGNOSIS — Z6832 Body mass index (BMI) 32.0-32.9, adult: Secondary | ICD-10-CM | POA: Diagnosis not present

## 2017-12-19 DIAGNOSIS — I482 Chronic atrial fibrillation, unspecified: Secondary | ICD-10-CM | POA: Diagnosis not present

## 2017-12-19 DIAGNOSIS — G629 Polyneuropathy, unspecified: Secondary | ICD-10-CM | POA: Diagnosis not present

## 2017-12-31 IMAGING — NM NM MYOCAR MULTI W/SPECT W/WALL MOTION & EF
2 series · 12 of 12 positions shown · non-contrast
Comparison: none

[Series 1: rest · 8.28mm/px · 6 of 64 frames shown]
[frame 6/64]
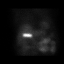
[frame 16/64]
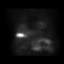
[frame 27/64]
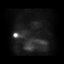
[frame 38/64]
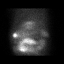
[frame 48/64]
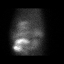
[frame 59/64]
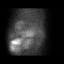

[Series 2: stress gated · 8.28mm/px · 6 of 64 frames shown]
[frame 6/64]
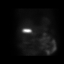
[frame 16/64]
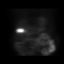
[frame 27/64]
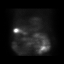
[frame 38/64]
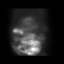
[frame 48/64]
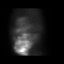
[frame 59/64]
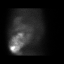

[12 of 12 positions shown; findings below may reference images not displayed]

Canned report from images found in remote index.

Refer to host system for actual result text.

## 2018-01-12 DIAGNOSIS — B351 Tinea unguium: Secondary | ICD-10-CM | POA: Diagnosis not present

## 2018-01-12 DIAGNOSIS — M79674 Pain in right toe(s): Secondary | ICD-10-CM | POA: Diagnosis not present

## 2018-01-12 DIAGNOSIS — L600 Ingrowing nail: Secondary | ICD-10-CM | POA: Diagnosis not present

## 2018-01-12 DIAGNOSIS — M79675 Pain in left toe(s): Secondary | ICD-10-CM | POA: Diagnosis not present

## 2018-01-23 ENCOUNTER — Other Ambulatory Visit: Payer: Self-pay

## 2018-01-26 ENCOUNTER — Other Ambulatory Visit: Payer: Self-pay | Admitting: Cardiology

## 2018-02-07 ENCOUNTER — Other Ambulatory Visit: Payer: Self-pay | Admitting: Cardiology

## 2018-02-13 DIAGNOSIS — Z6832 Body mass index (BMI) 32.0-32.9, adult: Secondary | ICD-10-CM | POA: Diagnosis not present

## 2018-02-13 DIAGNOSIS — G629 Polyneuropathy, unspecified: Secondary | ICD-10-CM | POA: Diagnosis not present

## 2018-02-13 DIAGNOSIS — J209 Acute bronchitis, unspecified: Secondary | ICD-10-CM | POA: Diagnosis not present

## 2018-02-13 DIAGNOSIS — E1165 Type 2 diabetes mellitus with hyperglycemia: Secondary | ICD-10-CM | POA: Diagnosis not present

## 2018-02-13 DIAGNOSIS — I4891 Unspecified atrial fibrillation: Secondary | ICD-10-CM | POA: Diagnosis not present

## 2018-02-16 DIAGNOSIS — E11319 Type 2 diabetes mellitus with unspecified diabetic retinopathy without macular edema: Secondary | ICD-10-CM | POA: Diagnosis not present

## 2018-03-06 DIAGNOSIS — L570 Actinic keratosis: Secondary | ICD-10-CM | POA: Diagnosis not present

## 2018-03-06 DIAGNOSIS — L578 Other skin changes due to chronic exposure to nonionizing radiation: Secondary | ICD-10-CM | POA: Diagnosis not present

## 2018-03-06 DIAGNOSIS — C44319 Basal cell carcinoma of skin of other parts of face: Secondary | ICD-10-CM | POA: Diagnosis not present

## 2018-03-06 DIAGNOSIS — D485 Neoplasm of uncertain behavior of skin: Secondary | ICD-10-CM | POA: Diagnosis not present

## 2018-03-06 DIAGNOSIS — L819 Disorder of pigmentation, unspecified: Secondary | ICD-10-CM | POA: Diagnosis not present

## 2018-03-23 ENCOUNTER — Other Ambulatory Visit: Payer: Self-pay | Admitting: Cardiology

## 2018-04-13 DIAGNOSIS — B351 Tinea unguium: Secondary | ICD-10-CM | POA: Diagnosis not present

## 2018-04-13 DIAGNOSIS — M79674 Pain in right toe(s): Secondary | ICD-10-CM | POA: Diagnosis not present

## 2018-04-13 DIAGNOSIS — L600 Ingrowing nail: Secondary | ICD-10-CM | POA: Diagnosis not present

## 2018-04-13 DIAGNOSIS — M79675 Pain in left toe(s): Secondary | ICD-10-CM | POA: Diagnosis not present

## 2018-04-25 DIAGNOSIS — C44319 Basal cell carcinoma of skin of other parts of face: Secondary | ICD-10-CM | POA: Diagnosis not present

## 2018-05-04 ENCOUNTER — Other Ambulatory Visit (HOSPITAL_COMMUNITY)
Admission: RE | Admit: 2018-05-04 | Discharge: 2018-05-04 | Disposition: A | Discharge status: Home / Self Care | Payer: Medicare Other | Source: Ambulatory Visit | Attending: Cardiology | Admitting: Cardiology

## 2018-05-04 DIAGNOSIS — Z79899 Other long term (current) drug therapy: Secondary | ICD-10-CM | POA: Diagnosis not present

## 2018-05-04 LAB — CBC
HCT: 42.8 % (ref 39.0–52.0)
HEMOGLOBIN: 13.5 g/dL (ref 13.0–17.0)
MCH: 30.7 pg (ref 26.0–34.0)
MCHC: 31.5 g/dL (ref 30.0–36.0)
MCV: 97.3 fL (ref 80.0–100.0)
Platelets: 140 10*3/uL — ABNORMAL LOW (ref 150–400)
RBC: 4.4 MIL/uL (ref 4.22–5.81)
RDW: 13.7 % (ref 11.5–15.5)
WBC: 6.1 10*3/uL (ref 4.0–10.5)
nRBC: 0 % (ref 0.0–0.2)

## 2018-05-04 LAB — BASIC METABOLIC PANEL
Anion gap: 9 (ref 5–15)
BUN: 17 mg/dL (ref 8–23)
CHLORIDE: 101 mmol/L (ref 98–111)
CO2: 27 mmol/L (ref 22–32)
CREATININE: 0.9 mg/dL (ref 0.61–1.24)
Calcium: 8.7 mg/dL — ABNORMAL LOW (ref 8.9–10.3)
GFR calc non Af Amer: >60 mL/min (ref >60)
Glucose, Bld: 242 mg/dL — ABNORMAL HIGH (ref 70–99)
Potassium: 4 mmol/L (ref 3.5–5.1)
SODIUM: 137 mmol/L (ref 135–145)

## 2018-05-08 ENCOUNTER — Encounter: Payer: Self-pay | Admitting: Cardiology

## 2018-05-08 ENCOUNTER — Ambulatory Visit (INDEPENDENT_AMBULATORY_CARE_PROVIDER_SITE_OTHER): Payer: Medicare Other | Admitting: Cardiology

## 2018-05-08 VITALS — BP 154/82 | HR 76 | Ht 68.0 in | Wt 215.4 lb

## 2018-05-08 DIAGNOSIS — E782 Mixed hyperlipidemia: Secondary | ICD-10-CM

## 2018-05-08 DIAGNOSIS — I25119 Atherosclerotic heart disease of native coronary artery with unspecified angina pectoris: Secondary | ICD-10-CM

## 2018-05-08 DIAGNOSIS — I10 Essential (primary) hypertension: Secondary | ICD-10-CM | POA: Diagnosis not present

## 2018-05-08 DIAGNOSIS — I4821 Permanent atrial fibrillation: Secondary | ICD-10-CM | POA: Diagnosis not present

## 2018-05-08 NOTE — Progress Notes (Signed)
Cardiology Office Note  Date: 05/08/2018   ID: Richard [name], Richard [name] 04-22-1933, MRN [id number]  PCP: [facility]  Primary Cardiologist: Rozann Lesches, MD   Chief Complaint  Patient presents with  . Atrial Fibrillation    History of Present Illness: Richard [name] is an 85 y.o. male last seen in September 2019.  He is here today with his wife for a follow-up visit.  He reports chronic dyspnea on exertion, no angina symptoms.  Still functional with ADLs.  Recent lab work is outlined below.  He does not report any bleeding problems on Eliquis, no stool changes.  Heart rate control regimen includes Toprol-XL and Cardizem CD.  I personally reviewed his ECG today which shows rate controlled atrial fibrillation with R' in lead V1.  Past Medical History:  Diagnosis Date  . CAD (coronary artery disease)    Mild-moderate LM with occluded RCA (collaterals) - managed medically 2013  . GERD (gastroesophageal reflux disease)   . Hypertensive heart disease   . Obesity (BMI 30.0-34.9)   . Old inferior myocardial infarction 1995  . PAF (paroxysmal atrial fibrillation) ([place])   . Pulmonary embolus ([place])    [name] 2015    Past Surgical History:  Procedure Laterality Date  . ACHILLES TENDON REPAIR    . APPENDECTOMY    . LEFT HEART CATHETERIZATION WITH CORONARY ANGIOGRAM N/A 01/24/2012   Procedure: LEFT HEART CATHETERIZATION WITH CORONARY ANGIOGRAM;  Surgeon: Jacolyn Reedy, MD;  Location: [facility] CATH LAB;  Service: Cardiovascular;  Laterality: N/A;  . SHOULDER SURGERY      Current Outpatient Medications  Medication Sig Dispense Refill  . acetaminophen (TYLENOL) 500 MG tablet Take 1,000 mg every 6 (six) hours as needed by mouth for mild pain or headache.    Marland Kitchen atorvastatin (LIPITOR) 10 MG tablet Take 10 mg by mouth every evening.    . diltiazem (CARDIZEM CD) 240 MG 24 hr capsule Take 1 capsule (240 mg total) by mouth daily. 90 capsule 3  . ELIQUIS 5 MG TABS tablet  TAKE 1 TABLET BY MOUTH TWICE DAILY 60 tablet 6  . gabapentin (NEURONTIN) 300 MG capsule Take 300 mg by mouth 3 (three) times daily.  6  . isosorbide mononitrate (IMDUR) 30 MG 24 hr tablet TAKE 1 TABLET BY MOUTH ONCE DAILY IN THE EVENING 90 tablet 0  . losartan (COZAAR) 25 MG tablet TAKE 1 TABLET BY MOUTH ONCE DAILY 90 tablet 3  . meclizine (ANTIVERT) 12.5 MG tablet TAKE 1 TABLET BY MOUTH EVERY 4 TO 6 HOURS AS NEEDED FOR DIZZINESS  0  . metFORMIN (GLUCOPHAGE-XR) 500 MG 24 hr tablet Take 500 mg by mouth 2 (two) times daily.     . metoprolol succinate (TOPROL-XL) 50 MG 24 hr tablet TAKE 1 TABLET BY MOUTH TWICE DAILY. TAKE WITH OR IMMEDIATELY FOLLOWING A MEAL. 180 tablet 3  . nitroGLYCERIN (NITROSTAT) 0.4 MG SL tablet Place 1 tablet (0.4 mg total) under the tongue every 5 (five) minutes x 3 doses as needed for chest pain. 25 tablet 3  . terbinafine (LAMISIL) 250 MG tablet Take 250 mg by mouth daily.  0   No current facility-administered medications for this visit.    Allergies:  Niaspan [niacin er]   Social History: The patient  reports that he quit smoking about 25 years ago. He smoked 0.25 packs per day. He has never used smokeless tobacco. He reports that he does not drink alcohol or use drugs.   ROS:  Please see  the history of present illness. Otherwise, complete review of systems is positive for hearing loss.  All other systems are reviewed and negative.   Physical Exam: VS:  BP (!) 154/82   Pulse 76   Ht 5\' 8"  (1.727 m)   Wt 215 lb 6.4 oz (97.7 kg)   SpO2 97% Comment: on room air  BMI 32.75 kg/m , BMI Body mass index is 32.75 kg/m.  Wt Readings from Last 3 Encounters:  05/08/18 215 lb 6.4 oz (97.7 kg)  11/09/17 213 lb (96.6 kg)  05/13/17 214 lb (97.1 kg)    General: Elderly male, appears comfortable at rest. HEENT: Conjunctiva and lids normal, oropharynx clear. Neck: Supple, no elevated JVP or carotid bruits, no thyromegaly. Lungs: Clear to auscultation, nonlabored breathing at  rest. Cardiac: Irregularly irregular, no S3, soft systolic murmur. Abdomen: Soft, nontender, bowel sounds present. Extremities: No pitting edema, distal pulses 2+. Skin: Warm and dry. Musculoskeletal: No kyphosis. Neuropsychiatric: Alert and oriented x3, affect grossly appropriate.  ECG: I personally reviewed the tracing from 02/24/2017 which showed rate controlled atrial fibrillation with R' in lead V1 and V2.  Recent Labwork: 05/04/2018: BUN 17; Creatinine, Ser 0.90; Hemoglobin 13.5; Platelets 140; Potassium 4.0; Sodium 137     Component Value Date/Time   CHOL 141 01/22/2012 0515   TRIG 122 01/22/2012 0515   HDL 36 (L) 01/22/2012 0515   CHOLHDL 3.9 01/22/2012 0515   VLDL 24 01/22/2012 0515   [facility] 81 01/22/2012 0515    Other Studies Reviewed Today:  Right and left heart catheterization 01/13/2017:  Prox RCA lesion is 100% stenosed.  Ost 1st Mrg to 1st Mrg lesion is 60% stenosed.  Ost Cx to Prox Cx lesion is 20% stenosed.  Ost 2nd Mrg lesion is 50% stenosed.  Prox LAD lesion is 30% stenosed.  Ost 1st Diag to 1st Diag lesion is 40% stenosed.  Ost LM lesion is 20% stenosed.  1. Chronic occlusion RCA with brisk collateral filling from left to right collaterals. This is known from cath in 2013.  2. Mild non-obstructive plaque in the left main and LAD. The second obtuse marginal branch has a moderate stenosis that does not appear to be flow limiting.  3. I did not cross the aortic valve due to radial artery spasm at the end of the case.   Fick Cardiac Output 4.69 L/min  Fick Cardiac Output Index 2.22 (L/min)/BSA  RA A Wave 6 mmHg  RA V Wave 9 mmHg  RA Mean 6 mmHg  RV Systolic Pressure 30 mmHg  RV Diastolic Pressure 2 mmHg  RV EDP 5 mmHg  PA Systolic Pressure 34 mmHg  PA Diastolic Pressure 10 mmHg  PA Mean 20 mmHg  PW A Wave 14 mmHg  PW V Wave 14 mmHg  PW Mean 11 mmHg  AO Systolic Pressure 127 mmHg  AO Diastolic Pressure 82 mmHg  AO Mean 106 mmHg  QP/QS 1    TPVR Index 9 HRUI  TSVR Index 47.72 HRUI  PVR SVR Ratio 0.09  TPVR/TSVR Ratio 0.19   Recommendations: Continue medical management of CAD.  Assessment and Plan:  1.  Permanent atrial fibrillation.  Continue Eliquis for stroke prophylaxis, follow-up CBC and BMET in 6 months.  No change in heart rate control regimen.  2.  CAD with known occlusion of the RCA associated with left-to-right collaterals and otherwise mild to moderate left system disease by cardiac catheterization in 2018.  Plan to continue medical therapy including statin.  He is not on  aspirin due to concurrent use of Eliquis.  3.  Mixed hyperlipidemia, remains on Lipitor with follow-up at [facility].  4.  Essential hypertension, no changes to current regimen.  Keep follow-up with [facility].  Current medicines were reviewed with the patient today.   Orders Placed This Encounter  Procedures  . CBC  . Basic Metabolic Panel (BMET)  . EKG 12-Lead    Disposition: Follow-up in 6 months.  Signed, Satira Sark, MD, [facility] 05/08/2018 11:56 AM    [facility] [place]. [address] [place], [place] [address] Phone: [phone]; Fax: [phone]

## 2018-05-08 NOTE — Patient Instructions (Addendum)
Medication Instructions:  Your physician recommends that you continue on your current medications as directed. Please refer to the Current Medication list given to you today.  If you need a refill on your cardiac medications before your next appointment, please call your pharmacy.   Lab work: [employer] BEFORE NEXT VISIT IN 6 MONTHS  If you have labs (blood work) drawn today and your tests are completely normal, you will receive your results only by: Marland Kitchen MyChart Message (if you have MyChart) OR . A paper copy in the mail If you have any lab test that is abnormal or we need to change your treatment, we will call you to review the results.  Testing/Procedures: None today  Follow-Up: At [facility], you and your health needs are our priority.  As part of our continuing mission to provide you with exceptional heart care, we have created designated Provider Care Teams.  These Care Teams include your primary Cardiologist (physician) and Advanced Practice Providers (APPs -  Physician Assistants and Nurse Practitioners) who all work together to provide you with the care you need, when you need it. You will need a follow up appointment in 6 months.  Please call our office 2 months in advance to schedule this appointment.  You may see Rozann Lesches, MD or one of the following Advanced Practice Providers on your designated Care Team:   Bernerd Pho, PA-C [facility]) . Ermalinda Barrios, PA-C ([place])  Any Other Special Instructions Will Be Listed Below (If Applicable). None

## 2018-05-12 DIAGNOSIS — L600 Ingrowing nail: Secondary | ICD-10-CM | POA: Diagnosis not present

## 2018-05-12 DIAGNOSIS — M79674 Pain in right toe(s): Secondary | ICD-10-CM | POA: Diagnosis not present

## 2018-05-12 DIAGNOSIS — M79675 Pain in left toe(s): Secondary | ICD-10-CM | POA: Diagnosis not present

## 2018-05-12 DIAGNOSIS — B351 Tinea unguium: Secondary | ICD-10-CM | POA: Diagnosis not present

## 2018-06-26 ENCOUNTER — Other Ambulatory Visit: Payer: Self-pay | Admitting: Cardiology

## 2018-07-04 DIAGNOSIS — G629 Polyneuropathy, unspecified: Secondary | ICD-10-CM | POA: Diagnosis not present

## 2018-07-04 DIAGNOSIS — E1165 Type 2 diabetes mellitus with hyperglycemia: Secondary | ICD-10-CM | POA: Diagnosis not present

## 2018-07-04 DIAGNOSIS — I10 Essential (primary) hypertension: Secondary | ICD-10-CM | POA: Diagnosis not present

## 2018-07-04 DIAGNOSIS — Z6833 Body mass index (BMI) 33.0-33.9, adult: Secondary | ICD-10-CM | POA: Diagnosis not present

## 2018-07-04 DIAGNOSIS — I4891 Unspecified atrial fibrillation: Secondary | ICD-10-CM | POA: Diagnosis not present

## 2018-07-07 ENCOUNTER — Other Ambulatory Visit: Payer: Self-pay | Admitting: Cardiology

## 2018-09-06 DIAGNOSIS — E1165 Type 2 diabetes mellitus with hyperglycemia: Secondary | ICD-10-CM | POA: Diagnosis not present

## 2018-09-06 DIAGNOSIS — R42 Dizziness and giddiness: Secondary | ICD-10-CM | POA: Diagnosis not present

## 2018-09-06 DIAGNOSIS — Z6833 Body mass index (BMI) 33.0-33.9, adult: Secondary | ICD-10-CM | POA: Diagnosis not present

## 2018-10-11 DIAGNOSIS — Z85828 Personal history of other malignant neoplasm of skin: Secondary | ICD-10-CM | POA: Diagnosis not present

## 2018-10-11 DIAGNOSIS — L570 Actinic keratosis: Secondary | ICD-10-CM | POA: Diagnosis not present

## 2018-10-11 DIAGNOSIS — L905 Scar conditions and fibrosis of skin: Secondary | ICD-10-CM | POA: Diagnosis not present

## 2018-10-31 ENCOUNTER — Telehealth: Payer: Self-pay | Admitting: Cardiology

## 2018-10-31 NOTE — Telephone Encounter (Signed)
Virtual Visit Pre-Appointment Phone Call  "(Name), I am calling you today to discuss your upcoming appointment. We are currently trying to limit exposure to the virus that causes COVID-19 by seeing patients at home rather than in the office."  1. "What is the BEST phone number to call the day of the visit?" - include this in appointment notes  2. Do you have or have access to (through a family member/friend) a smartphone with video capability that we can use for your visit?" a. If yes - list this number in appt notes as cell (if different from BEST phone #) and list the appointment type as a VIDEO visit in appointment notes b. If no - list the appointment type as a PHONE visit in appointment notes  3. Confirm consent - "In the setting of the current Covid19 crisis, you are scheduled for a (phone or video) visit with your provider on (date) at (time).  Just as we do with many in-office visits, in order for you to participate in this visit, we must obtain consent.  If you'd like, I can send this to your mychart (if signed up) or email for you to review.  Otherwise, I can obtain your verbal consent now.  All virtual visits are billed to your insurance company just like a normal visit would be.  By agreeing to a virtual visit, we'd like you to understand that the technology does not allow for your provider to perform an examination, and thus may limit your provider's ability to fully assess your condition. If your provider identifies any concerns that need to be evaluated in person, we will make arrangements to do so.  Finally, though the technology is pretty good, we cannot assure that it will always work on either your or our end, and in the setting of a video visit, we may have to convert it to a phone-only visit.  In either situation, we cannot ensure that we have a secure connection.  Are you willing to proceed?" STAFF: Did the patient verbally acknowledge consent to telehealth visit? Document  YES/NO here: Yes  4. Advise patient to be prepared - "Two hours prior to your appointment, go ahead and check your blood pressure, pulse, oxygen saturation, and your weight (if you have the equipment to check those) and write them all down. When your visit starts, your provider will ask you for this information. If you have an Apple Watch or Kardia device, please plan to have heart rate information ready on the day of your appointment. Please have a pen and paper handy nearby the day of the visit as well."  5. Give patient instructions for MyChart download to smartphone OR [email or web address] as below if video visit (depending on what platform provider is using)  6. Inform patient they will receive a phone call 15 minutes prior to their appointment time (may be from unknown caller ID) so they should be prepared to answer    TELEPHONE CALL NOTE  Richard [name] has been deemed a candidate for a follow-up tele-health visit to limit community exposure during the Covid-19 pandemic. I spoke with the patient via phone to ensure availability of phone/video source, confirm preferred email & phone number, and discuss instructions and expectations.  I reminded Richard [name] to be prepared with any vital sign and/or heart rhythm information that could potentially be obtained via home monitoring, at the time of his visit. I reminded Richard [name] to expect a phone call prior to  his visit.  Terry L Goins 10/31/2018 1:03 PM

## 2018-11-01 ENCOUNTER — Other Ambulatory Visit (HOSPITAL_COMMUNITY)
Admission: RE | Admit: 2018-11-01 | Discharge: 2018-11-01 | Disposition: A | Discharge status: Home / Self Care | Payer: Medicare Other | Source: Ambulatory Visit | Attending: Cardiology | Admitting: Cardiology

## 2018-11-01 DIAGNOSIS — I4821 Permanent atrial fibrillation: Secondary | ICD-10-CM | POA: Diagnosis not present

## 2018-11-01 LAB — CBC
HCT: 44.7 % (ref 39.0–52.0)
Hemoglobin: 14.4 g/dL (ref 13.0–17.0)
MCH: 31.6 pg (ref 26.0–34.0)
MCHC: 32.2 g/dL (ref 30.0–36.0)
MCV: 98.2 fL (ref 80.0–100.0)
Platelets: 147 10*3/uL — ABNORMAL LOW (ref 150–400)
RBC: 4.55 MIL/uL (ref 4.22–5.81)
RDW: 13.2 % (ref 11.5–15.5)
WBC: 7 10*3/uL (ref 4.0–10.5)
nRBC: 0 % (ref 0.0–0.2)

## 2018-11-01 LAB — BASIC METABOLIC PANEL
Anion gap: 9 (ref 5–15)
BUN: 16 mg/dL (ref 8–23)
CO2: 27 mmol/L (ref 22–32)
Calcium: 9 mg/dL (ref 8.9–10.3)
Chloride: 102 mmol/L (ref 98–111)
Creatinine, Ser: 0.99 mg/dL (ref 0.61–1.24)
GFR calc Af Amer: >60 mL/min (ref >60)
GFR calc non Af Amer: >60 mL/min (ref >60)
Glucose, Bld: 197 mg/dL — ABNORMAL HIGH (ref 70–99)
Potassium: 4.2 mmol/L (ref 3.5–5.1)
Sodium: 138 mmol/L (ref 135–145)

## 2018-11-07 ENCOUNTER — Telehealth (INDEPENDENT_AMBULATORY_CARE_PROVIDER_SITE_OTHER): Payer: Medicare Other | Admitting: Cardiology

## 2018-11-07 ENCOUNTER — Encounter: Payer: Self-pay | Admitting: Cardiology

## 2018-11-07 DIAGNOSIS — I10 Essential (primary) hypertension: Secondary | ICD-10-CM | POA: Diagnosis not present

## 2018-11-07 DIAGNOSIS — E782 Mixed hyperlipidemia: Secondary | ICD-10-CM

## 2018-11-07 DIAGNOSIS — I4821 Permanent atrial fibrillation: Secondary | ICD-10-CM

## 2018-11-07 DIAGNOSIS — I25119 Atherosclerotic heart disease of native coronary artery with unspecified angina pectoris: Secondary | ICD-10-CM | POA: Diagnosis not present

## 2018-11-07 NOTE — Patient Instructions (Signed)
Medication Instructions:   Your physician recommends that you continue on your current medications as directed. Please refer to the Current Medication list given to you today.  Labwork:  Your physician recommends that you return for lab work in: 6 months before your next visit to check your BMET & CBC. You will be contacted when this is due.  Testing/Procedures:  NONE  Follow-Up:  Your physician recommends that you schedule a follow-up appointment in: 6 months. You will receive a reminder letter in the mail in about 4 months reminding you to call and schedule your appointment. If you don't receive this letter, please contact our office.  Any Other Special Instructions Will Be Listed Below (If Applicable).  If you need a refill on your cardiac medications before your next appointment, please call your pharmacy.

## 2018-11-07 NOTE — Progress Notes (Signed)
Virtual Visit via Telephone Note   This visit type was conducted due to national recommendations for restrictions regarding the COVID-19 Pandemic (e.g. social distancing) in an effort to limit this patient's exposure and mitigate transmission in our community.  Due to his co-morbid illnesses, this patient is at least at moderate risk for complications without adequate follow up.  This format is felt to be most appropriate for this patient at this time.  The patient did not have access to video technology/had technical difficulties with video requiring transitioning to audio format only (telephone).  All issues noted in this document were discussed and addressed.  No physical exam could be performed with this format.  Please refer to the patient's chart for his  consent to telehealth for [facility].   Date:  11/07/2018   ID:  Richard [name], DOB 02/08/1934, MRN [id number]  Patient Location: Home Provider Location: Office  PCP:  [facility]  Cardiologist:  Rozann Lesches, MD Electrophysiologist:  None   Evaluation Performed:  Follow-Up Visit  Chief Complaint:   Cardiac follow-up  History of Present Illness:    Richard [name] is an 85 y.o. male last seen in March.  We spoke by phone today.  He tells me that he has had no change in symptoms, chronically short of breath but no exertional chest pain, rare sense of palpitations.  [place]  I reviewed his medications which are stable from a cardiac perspective and outlined below.  Heart rate is well controlled at rest, in the 80s today.  Also reviewed his recent lab work as outlined below.  He continues to follow at [place] for primary care.  The patient does not have symptoms concerning for COVID-19 infection (fever, chills, cough, or new shortness of breath).    Past Medical History:  Diagnosis Date  . CAD (coronary artery disease)    Mild-moderate LM with occluded RCA (collaterals) - managed medically 2013  . GERD (gastroesophageal reflux disease)   . Hypertensive heart disease   . Obesity (BMI 30.0-34.9)   . Old inferior myocardial infarction 1995  . PAF (paroxysmal atrial fibrillation) ([place])   . Pulmonary embolus ([place])    [name] 2015   Past Surgical History:  Procedure Laterality Date  . ACHILLES TENDON REPAIR    . APPENDECTOMY    . LEFT HEART CATHETERIZATION WITH CORONARY ANGIOGRAM N/A 01/24/2012   Procedure: LEFT HEART CATHETERIZATION WITH CORONARY ANGIOGRAM;  Surgeon: Jacolyn Reedy, MD;  Location: [facility] CATH LAB;  Service: Cardiovascular;  Laterality: N/A;  . SHOULDER SURGERY       Current Meds  Medication Sig  . acetaminophen (TYLENOL) 500 MG tablet Take 1,000 mg every 6 (six) hours as needed by mouth for mild pain or headache.  Marland Kitchen atorvastatin (LIPITOR) 10 MG tablet Take 10 mg by mouth every evening.  . diltiazem (CARDIZEM CD) 240 MG 24 hr capsule Take 1 capsule (240 mg total) by mouth daily.  Marland Kitchen ELIQUIS 5 MG TABS tablet TAKE 1 TABLET BY MOUTH TWICE DAILY  . gabapentin (NEURONTIN) 300 MG capsule Take 300 mg by mouth 3 (three) times daily.  . isosorbide mononitrate (IMDUR) 30 MG 24 hr tablet TAKE 1 TABLET BY MOUTH ONCE DAILY IN THE EVENING  . losartan (COZAAR) 25 MG tablet TAKE 1 TABLET BY MOUTH ONCE DAILY  . meclizine (ANTIVERT) 12.5 MG tablet TAKE 1 TABLET BY MOUTH EVERY 4 TO 6 HOURS  AS NEEDED FOR DIZZINESS  . metFORMIN (GLUCOPHAGE-XR) 500 MG 24 hr tablet Take 500 mg by mouth 2 (two) times daily.   . metoprolol succinate (TOPROL-XL) 50 MG 24 hr tablet TAKE 1 TABLET BY MOUTH TWICE DAILY. TAKE WITH OR IMMEDIATELY FOLLOWING A MEAL.  . nitroGLYCERIN (NITROSTAT) 0.4 MG SL tablet DISSOLVE ONE TABLET UNDER THE TONGUE EVERY 5 MINUTES AS NEEDED FOR CHEST PAIN.  DO NOT EXCEED A TOTAL OF 3 DOSES IN 15 MINUTES  . terbinafine (LAMISIL) 250 MG tablet Take 250 mg by mouth daily.     Allergies:   Niaspan [niacin er]    Social History   Tobacco Use  . Smoking status: Former Smoker    Packs/day: 0.25    Quit date: 03/08/1993    Years since quitting: 25.6  . Smokeless tobacco: Never Used  Substance Use Topics  . Alcohol use: No  . Drug use: No     Family Hx: The patient's family history includes Heart failure in his mother.  ROS:   Please see the history of present illness.    Hearing loss. All other systems reviewed and are negative.   Prior CV studies:   The following studies were reviewed today:  Right and left heart catheterization 01/13/2017:  Prox RCA lesion is 100% stenosed.  Ost 1st Mrg to 1st Mrg lesion is 60% stenosed.  Ost Cx to Prox Cx lesion is 20% stenosed.  Ost 2nd Mrg lesion is 50% stenosed.  Prox LAD lesion is 30% stenosed.  Ost 1st Diag to 1st Diag lesion is 40% stenosed.  Ost LM lesion is 20% stenosed.  1. Chronic occlusion RCA with brisk collateral filling from left to right collaterals. This is known from cath in 2013.  2. Mild non-obstructive plaque in the left main and LAD. The second obtuse marginal branch has a moderate stenosis that does not appear to be flow limiting.  3. I did not cross the aortic valve due to radial artery spasm at the end of the case.   Fick Cardiac Output 4.69 L/min  Fick Cardiac Output Index 2.22 (L/min)/BSA  RA A Wave 6 mmHg  RA V Wave 9 mmHg  RA Mean 6 mmHg  RV Systolic Pressure 30 mmHg  RV Diastolic Pressure 2 mmHg  RV EDP 5 mmHg  PA Systolic Pressure 34 mmHg  PA Diastolic Pressure 10 mmHg  PA Mean 20 mmHg  PW A Wave 14 mmHg  PW V Wave 14 mmHg  PW Mean 11 mmHg  AO Systolic Pressure Q000111Q mmHg  AO Diastolic Pressure 82 mmHg  AO Mean 106 mmHg  QP/QS 1  TPVR Index 9 HRUI  TSVR Index 47.72 HRUI  PVR SVR Ratio 0.09  TPVR/TSVR Ratio 0.19   Recommendations: Continue medical management of CAD.   Labs/Other Tests and Data Reviewed:    EKG:  An ECG dated 05/08/2018 was personally reviewed today and demonstrated:  Rate  controlled atrial fibrillation with R' in lead V1.  Recent Labs: 11/01/2018: BUN 16; Creatinine, Ser 0.99; Hemoglobin 14.4; Platelets 147; Potassium 4.2; Sodium 138    Wt Readings from Last 3 Encounters:  11/07/18 213 lb (96.6 kg)  05/08/18 215 lb 6.4 oz (97.7 kg)  11/09/17 213 lb (96.6 kg)     Objective:    Vital Signs:  BP 138/81   Pulse 80   Ht 5\' 8"  (1.727 m)   Wt 213 lb (96.6 kg)   BMI 32.39 kg/m    Patient spoke in full sentences, not short  of breath. No audible wheezing or coughing. Speech pattern normal.  ASSESSMENT & PLAN:    1.  Permanent atrial fibrillation.  Continue strategy of heart rate control and anticoagulation.  No reported bleeding problems on Eliquis, recent lab work reviewed.  2.  CAD with occlusion of the RCA associated with left-to-right collaterals and otherwise mild to moderate residual disease that is being managed medically as of angiography in 2018.  No obvious angina symptoms.  Continue statin therapy.  3.  Mixed hyperlipidemia.  He is on Lipitor with follow-up by PCP.  4.  Essential hypertension, systolic is in the Q000111Q today.  No changes are made to current regimen.  COVID-19 Education: The signs and symptoms of COVID-19 were discussed with the patient and how to seek care for testing (follow up with PCP or arrange E-visit).  The importance of social distancing was discussed today.  Time:   Today, I have spent 9 minutes with the patient with telehealth technology discussing the above problems.     Medication Adjustments/Labs and Tests Ordered: Current medicines are reviewed at length with the patient today.  Concerns regarding medicines are outlined above.   Tests Ordered: No orders of the defined types were placed in this encounter.   Medication Changes: No orders of the defined types were placed in this encounter.   Follow Up:  In Person 6 months in the [place] office.  Signed, Rozann Lesches, MD  11/07/2018 1:15 PM    [facility]

## 2018-11-30 DIAGNOSIS — Z23 Encounter for immunization: Secondary | ICD-10-CM | POA: Diagnosis not present

## 2018-12-06 DIAGNOSIS — E785 Hyperlipidemia, unspecified: Secondary | ICD-10-CM | POA: Diagnosis not present

## 2018-12-06 DIAGNOSIS — I10 Essential (primary) hypertension: Secondary | ICD-10-CM | POA: Diagnosis not present

## 2018-12-31 IMAGING — DX DG CHEST 2V
2 series · 2 of 2 positions shown · non-contrast
Comparison: 03/12/2016 chest x-ray. 03/16/2016 chest CT is not
available for review

CLINICAL DATA: Chest pain

EXAM:
CHEST  2 VIEW

[chest pa]
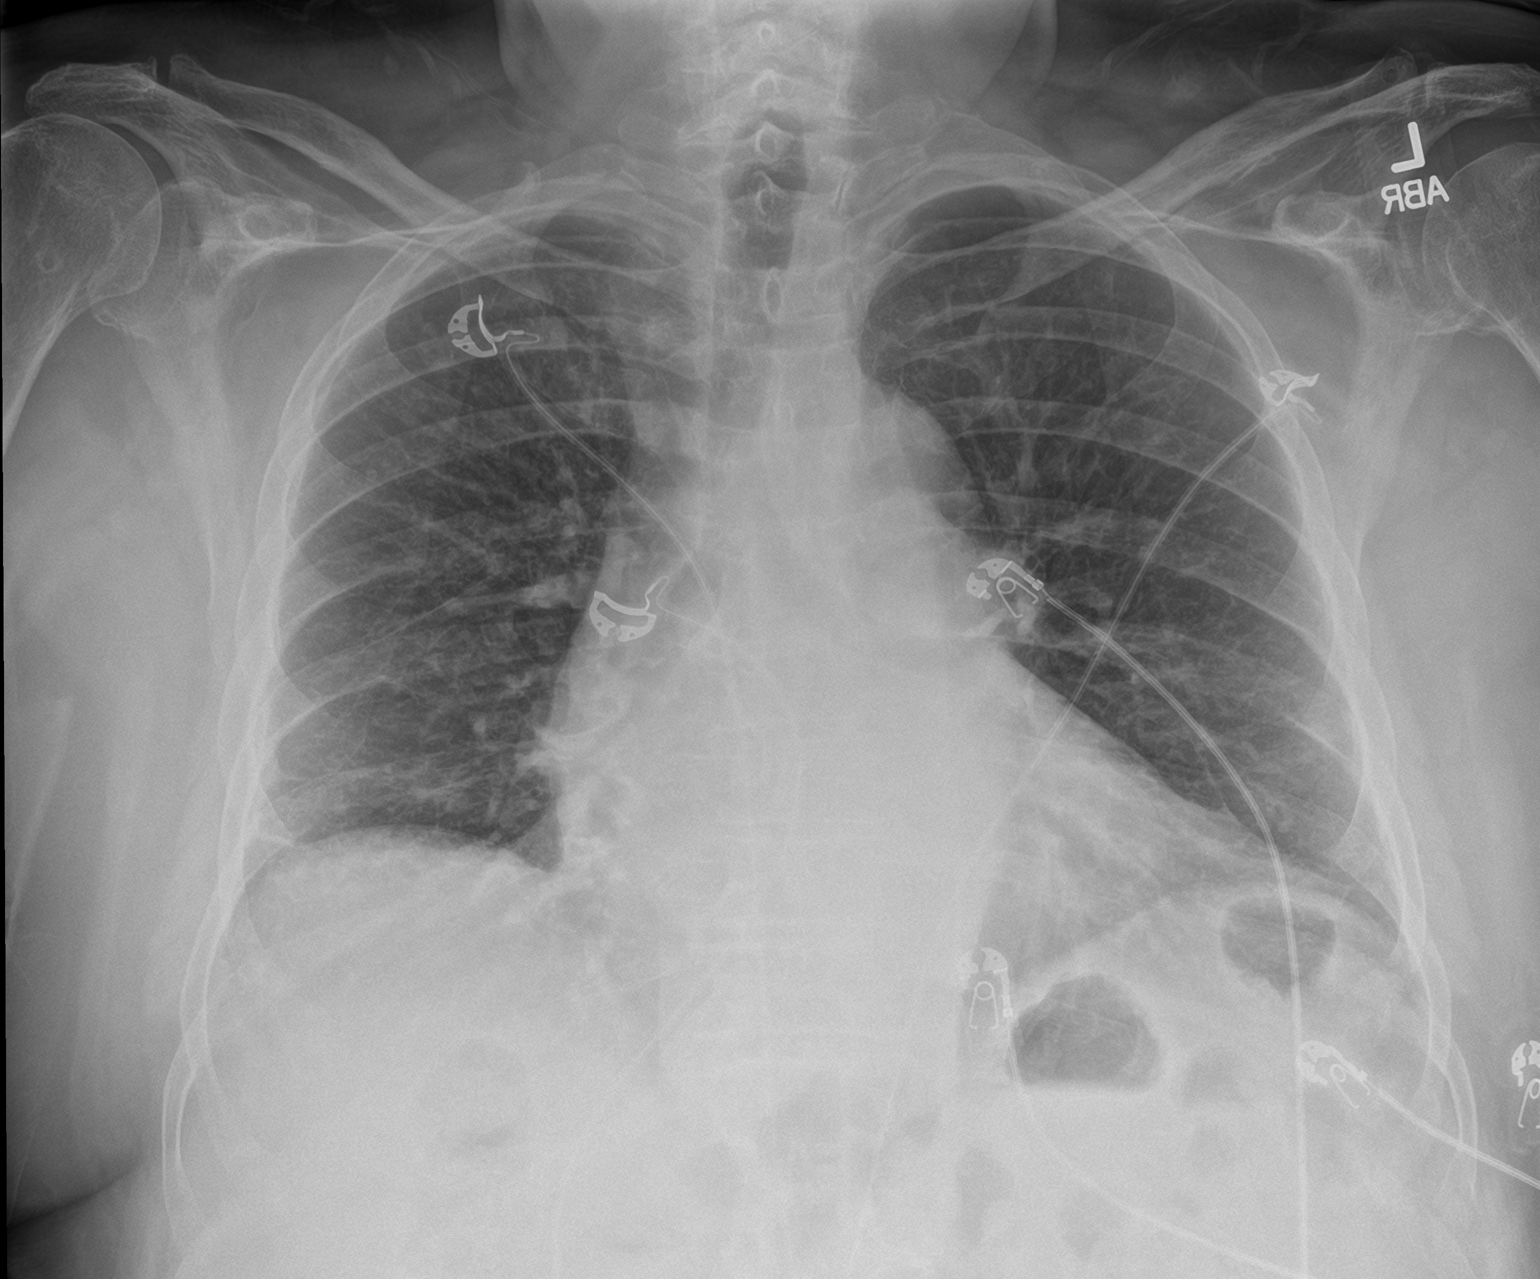

[chest lat]
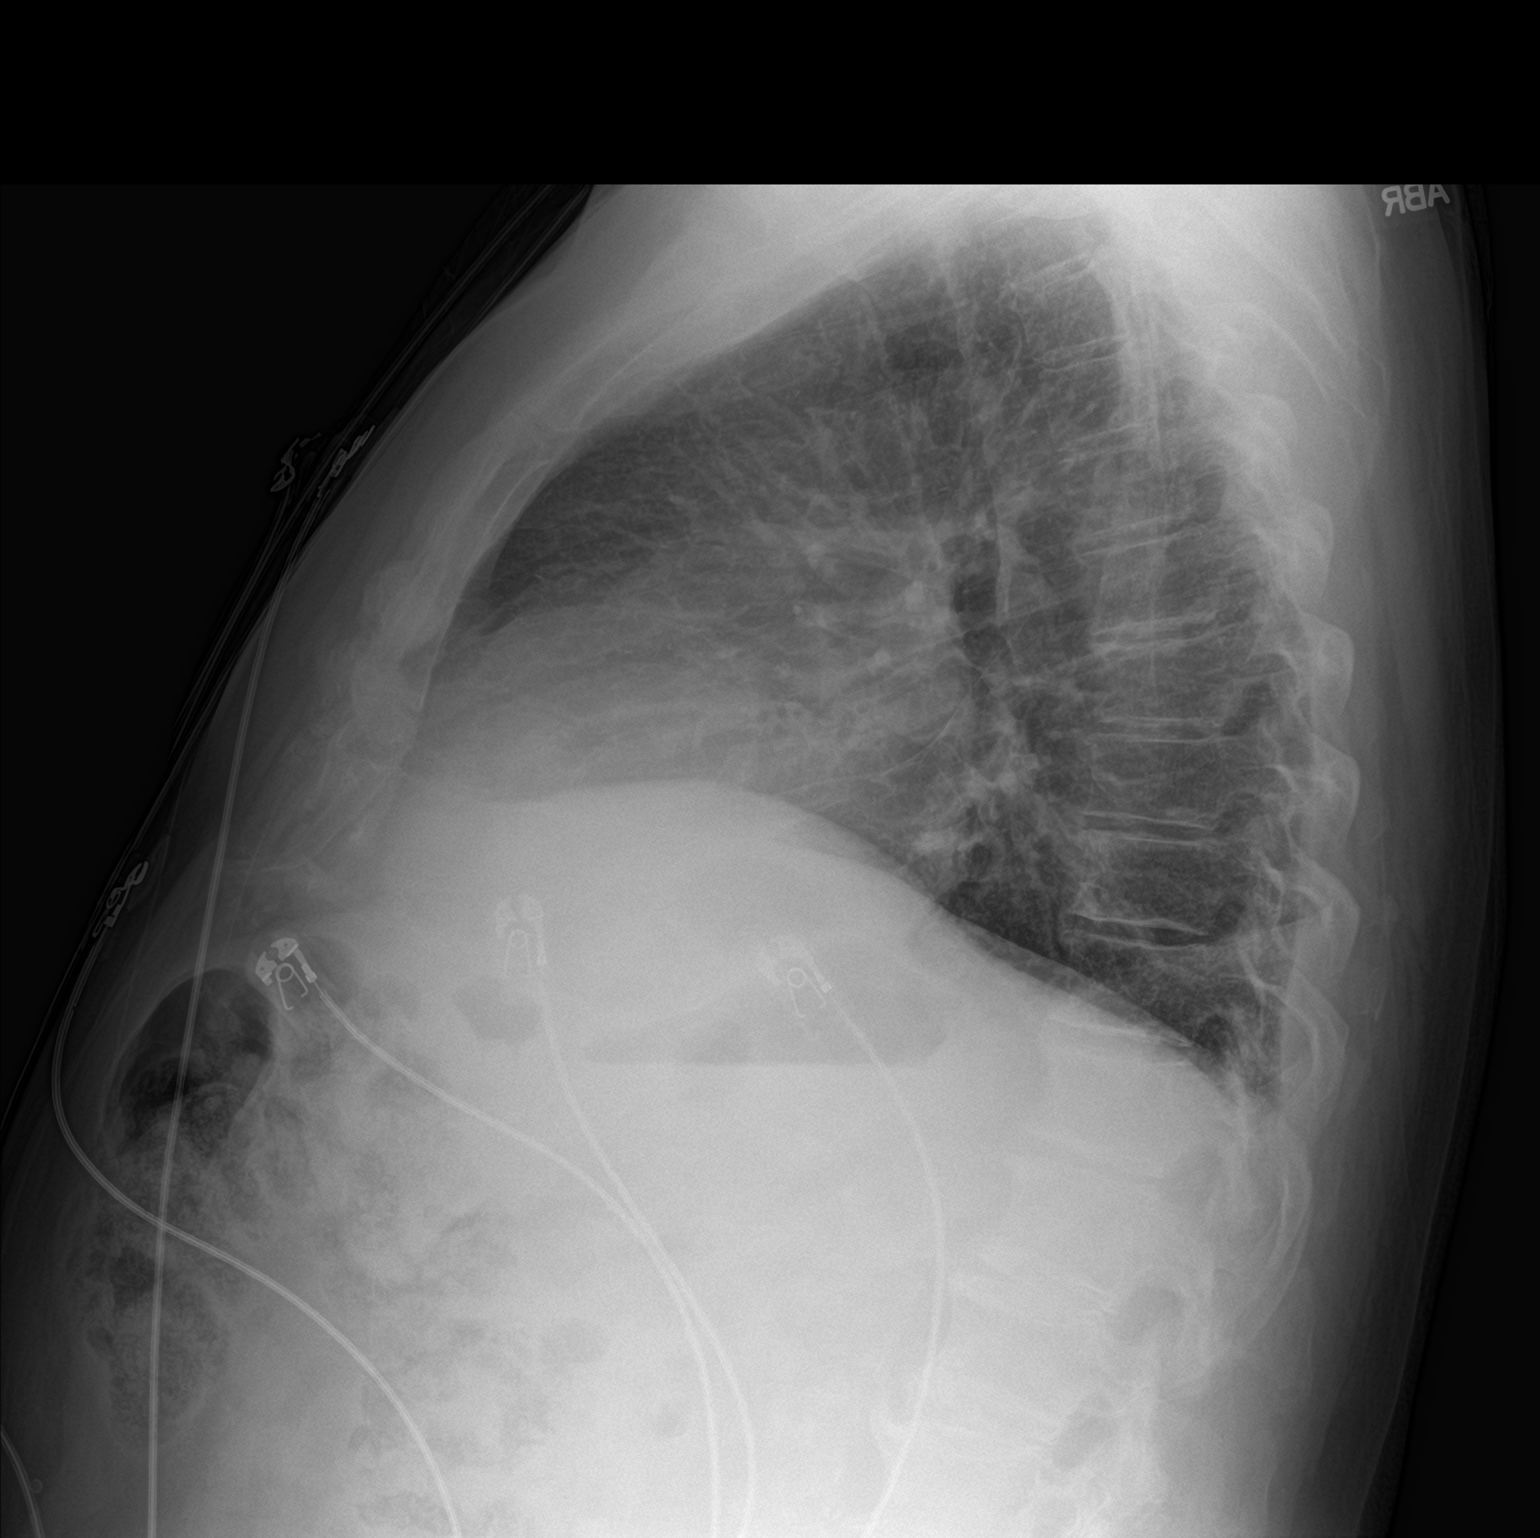

[2 of 2 positions shown; findings below may reference images not displayed]

FINDINGS: Stable mild cardiomegaly and aortic tortuosity. Stable low lung
volumes with scar-like appearance. There is no edema, consolidation,
effusion, or pneumothorax. Spondylosis. No acute osseous finding.
IMPRESSION: 1. No acute finding when compared to prior.
2. Cardiomegaly and mild pulmonary scarring.

## 2019-01-05 DIAGNOSIS — I10 Essential (primary) hypertension: Secondary | ICD-10-CM | POA: Diagnosis not present

## 2019-01-05 DIAGNOSIS — E1165 Type 2 diabetes mellitus with hyperglycemia: Secondary | ICD-10-CM | POA: Diagnosis not present

## 2019-01-31 DIAGNOSIS — Z0000 Encounter for general adult medical examination without abnormal findings: Secondary | ICD-10-CM | POA: Diagnosis not present

## 2019-01-31 DIAGNOSIS — I4891 Unspecified atrial fibrillation: Secondary | ICD-10-CM | POA: Diagnosis not present

## 2019-01-31 DIAGNOSIS — E1165 Type 2 diabetes mellitus with hyperglycemia: Secondary | ICD-10-CM | POA: Diagnosis not present

## 2019-01-31 DIAGNOSIS — G629 Polyneuropathy, unspecified: Secondary | ICD-10-CM | POA: Diagnosis not present

## 2019-01-31 DIAGNOSIS — Z6833 Body mass index (BMI) 33.0-33.9, adult: Secondary | ICD-10-CM | POA: Diagnosis not present

## 2019-02-10 ENCOUNTER — Other Ambulatory Visit: Payer: Self-pay | Admitting: Cardiology

## 2019-02-15 DIAGNOSIS — L570 Actinic keratosis: Secondary | ICD-10-CM | POA: Diagnosis not present

## 2019-02-15 DIAGNOSIS — L578 Other skin changes due to chronic exposure to nonionizing radiation: Secondary | ICD-10-CM | POA: Diagnosis not present

## 2019-03-20 DIAGNOSIS — Z23 Encounter for immunization: Secondary | ICD-10-CM | POA: Diagnosis not present

## 2019-03-29 ENCOUNTER — Telehealth: Payer: Self-pay | Admitting: Cardiology

## 2019-03-29 DIAGNOSIS — I4821 Permanent atrial fibrillation: Secondary | ICD-10-CM

## 2019-03-29 DIAGNOSIS — I25119 Atherosclerotic heart disease of native coronary artery with unspecified angina pectoris: Secondary | ICD-10-CM

## 2019-03-29 NOTE — Telephone Encounter (Signed)
mailed lab slips to patient

## 2019-03-29 NOTE — Telephone Encounter (Signed)
Scheduled 6 month f/u for pt--   Notes state: BMET & CBC (will need to be told about labs and sent to clinical staff when he calls for appt)   Please mail lab slips to pt, he's scheduled for apt on 04/27/19

## 2019-04-08 IMAGING — DX DG CHEST 2V
2 series · 2 of 2 positions shown · non-contrast
Comparison: Chest radiograph 11/18/2016

CLINICAL DATA: Cough

EXAM:
CHEST  2 VIEW

[chest pa]
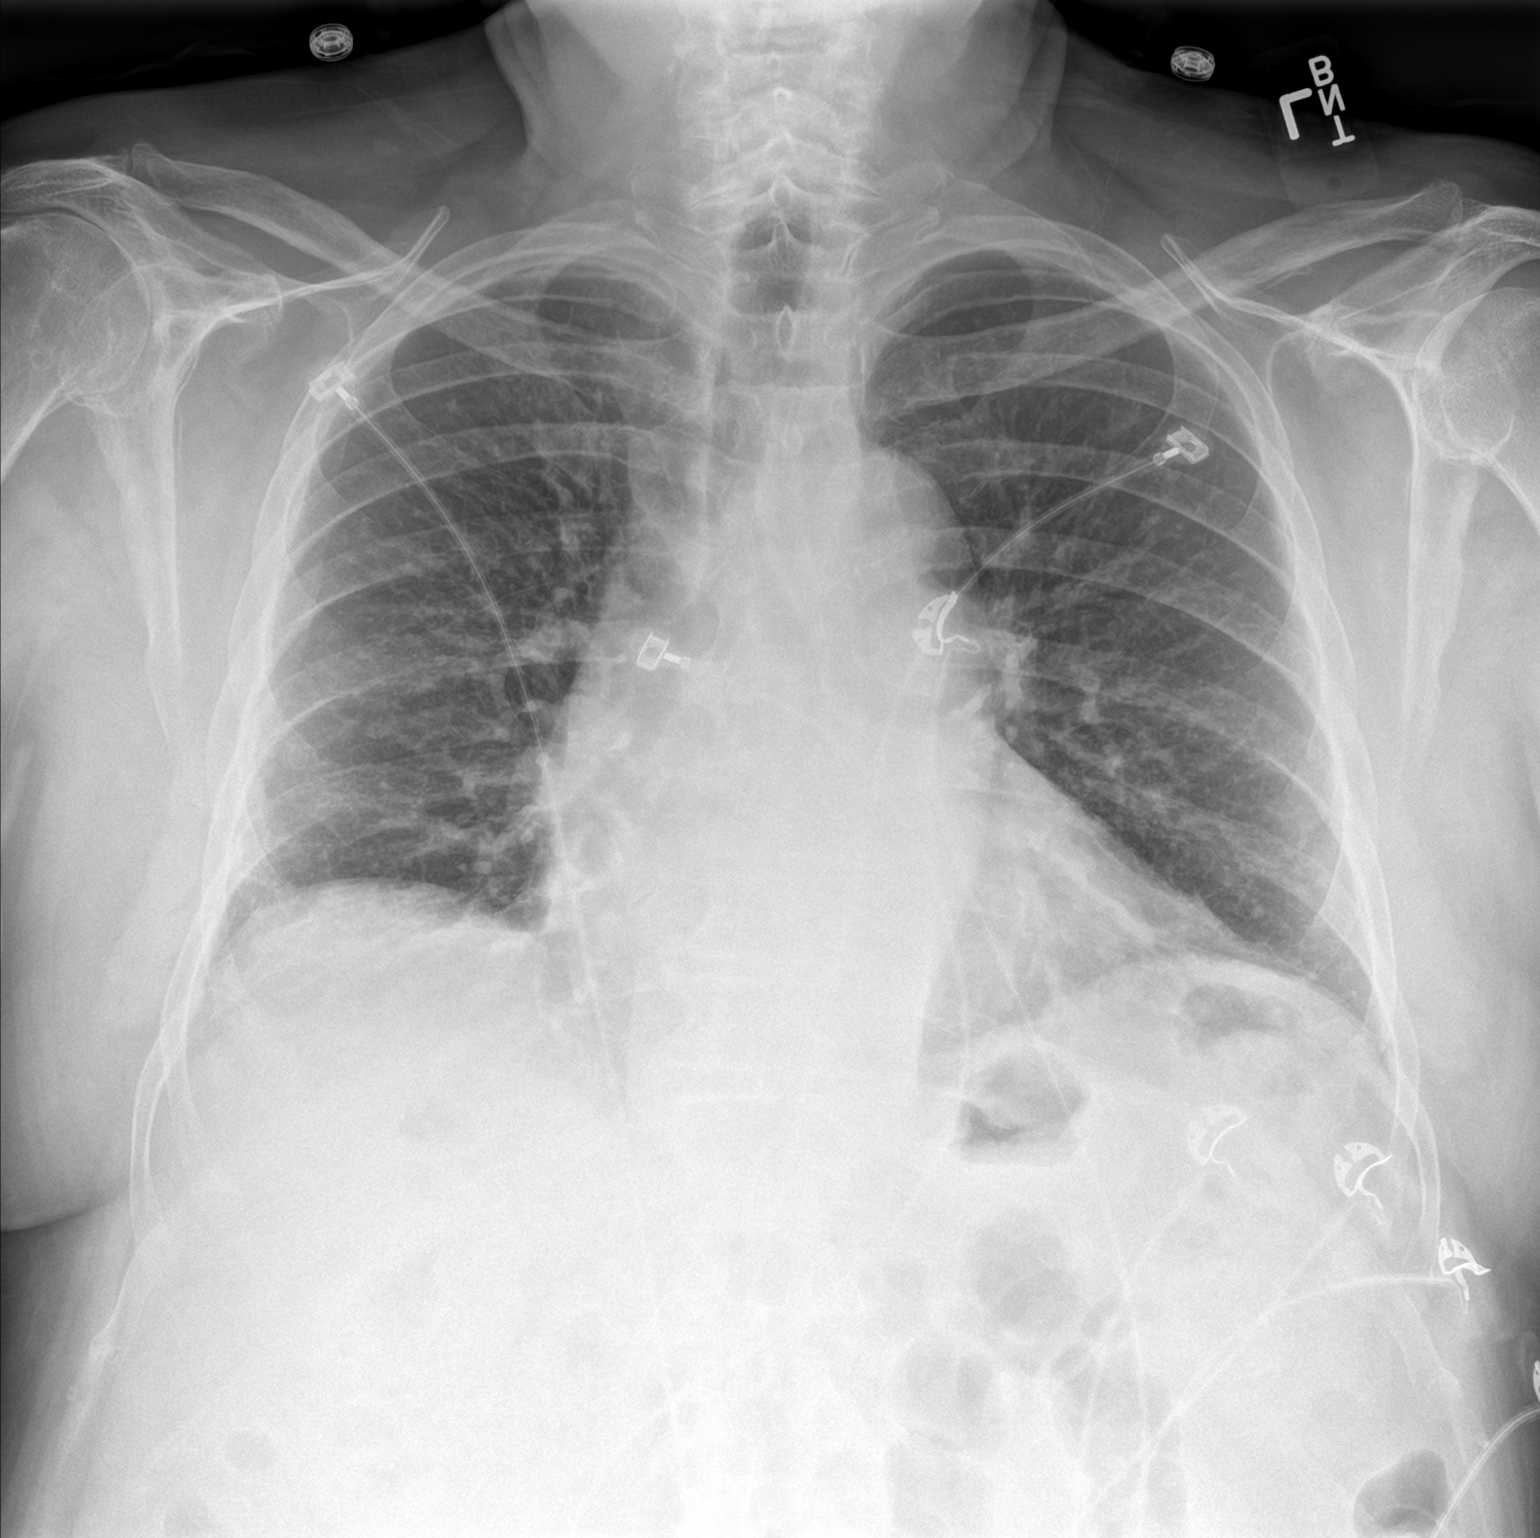

[chest lat]
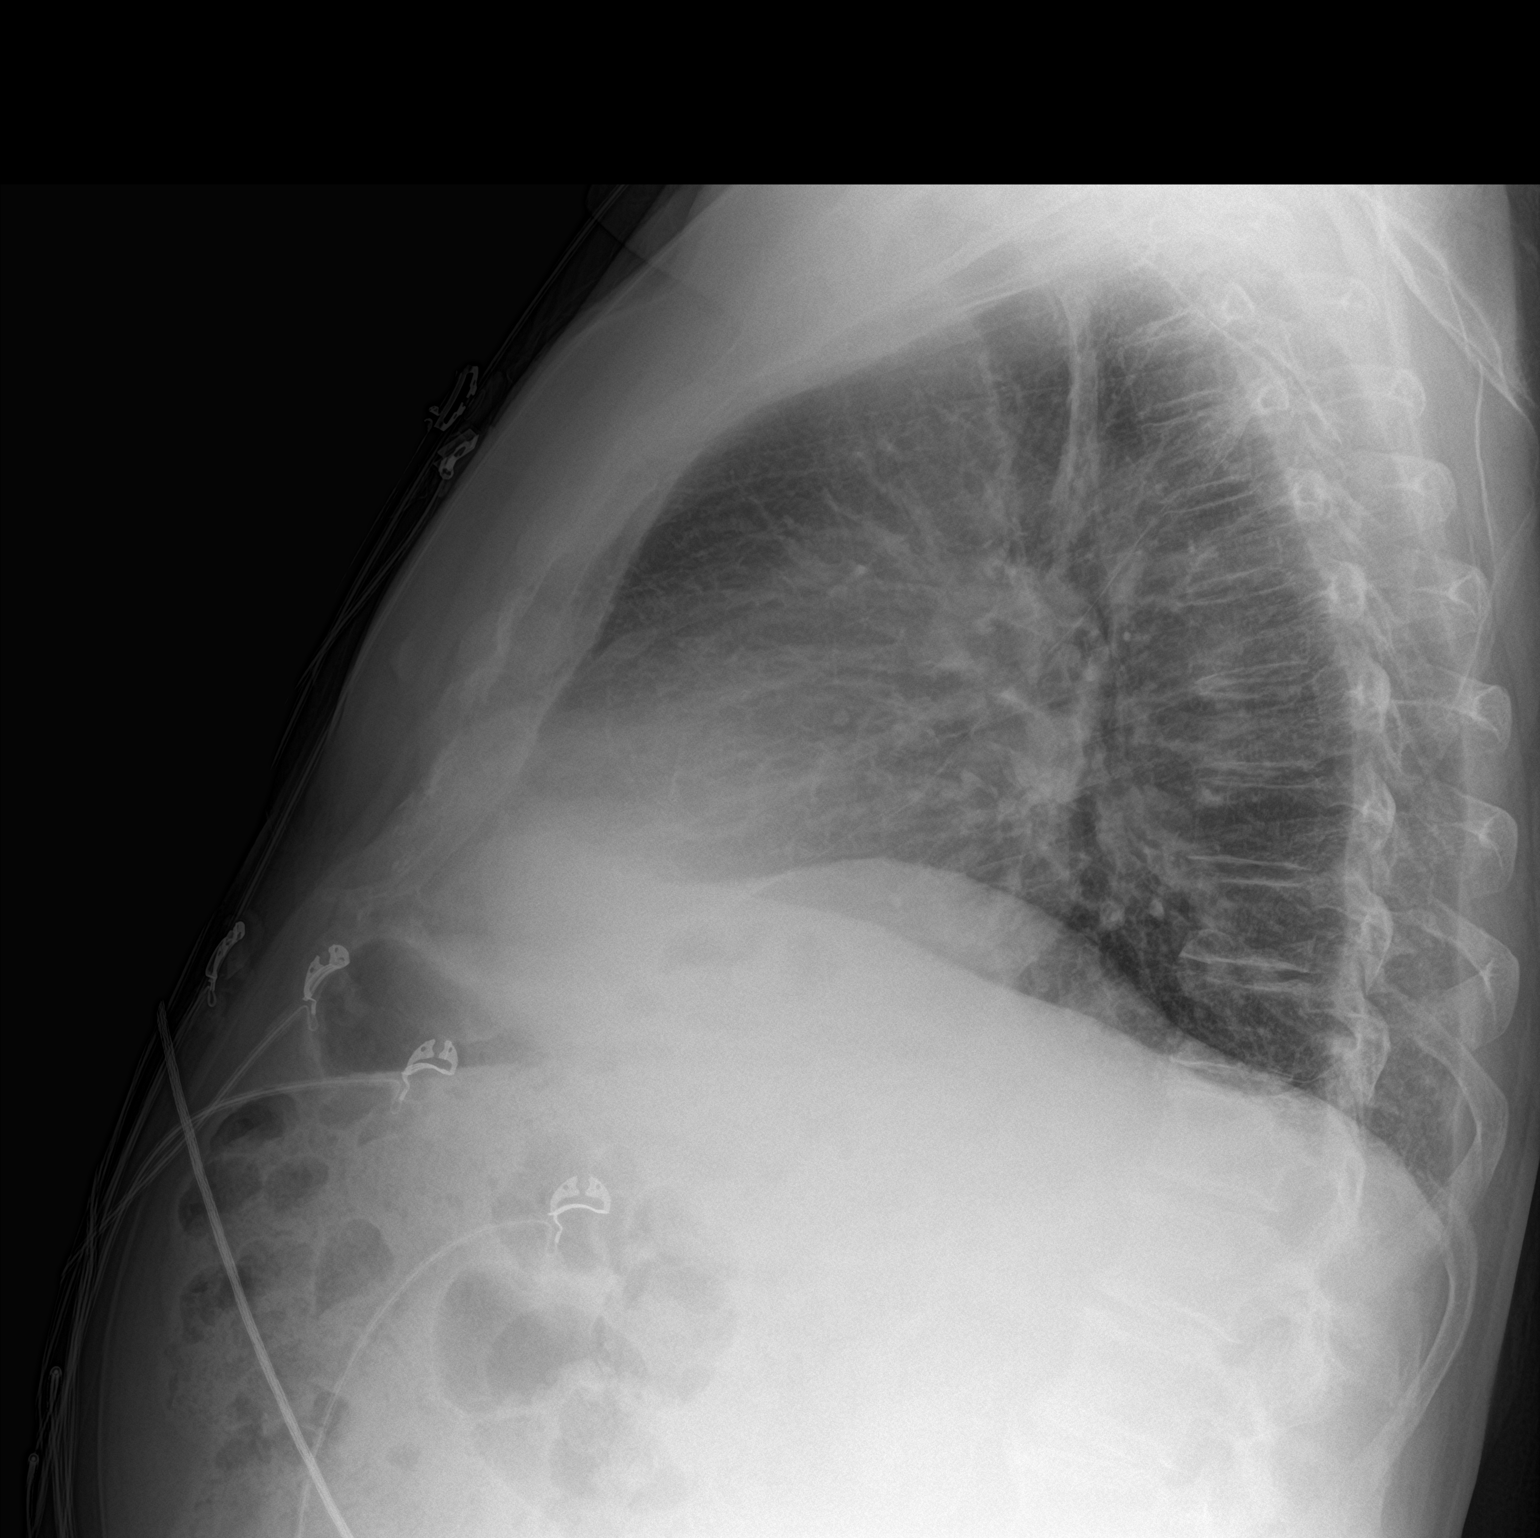

[2 of 2 positions shown; findings below may reference images not displayed]

FINDINGS: The heart size and mediastinal contours are within normal limits.
Unchanged mild basilar predominant scarring. No focal airspace
consolidation or pulmonary edema. The visualized skeletal structures
are unremarkable.
IMPRESSION: No active cardiopulmonary disease.

## 2019-04-13 ENCOUNTER — Telehealth: Payer: Self-pay | Admitting: Cardiology

## 2019-04-13 NOTE — Telephone Encounter (Signed)
Patient called wanting to know if he can take Zinc, Vitamin D3, Vitamin C.

## 2019-04-13 NOTE — Telephone Encounter (Signed)
I told wife they should check with pcp regarding Vit D dosing but he should be able to take vit c and zinc OTC

## 2019-04-20 DIAGNOSIS — Z23 Encounter for immunization: Secondary | ICD-10-CM | POA: Diagnosis not present

## 2019-04-23 ENCOUNTER — Other Ambulatory Visit (HOSPITAL_COMMUNITY)
Admission: RE | Admit: 2019-04-23 | Discharge: 2019-04-23 | Disposition: A | Discharge status: Home / Self Care | Payer: Medicare Other | Source: Ambulatory Visit | Attending: Cardiology | Admitting: Cardiology

## 2019-04-23 DIAGNOSIS — I25119 Atherosclerotic heart disease of native coronary artery with unspecified angina pectoris: Secondary | ICD-10-CM | POA: Diagnosis not present

## 2019-04-23 DIAGNOSIS — I4821 Permanent atrial fibrillation: Secondary | ICD-10-CM | POA: Diagnosis not present

## 2019-04-23 LAB — CBC WITH DIFFERENTIAL/PLATELET
Abs Immature Granulocytes: 0.03 10*3/uL (ref 0.00–0.07)
Basophils Absolute: 0.1 10*3/uL (ref 0.0–0.1)
Basophils Relative: 1 %
Eosinophils Absolute: 0.3 10*3/uL (ref 0.0–0.5)
Eosinophils Relative: 4 %
HCT: 46.6 % (ref 39.0–52.0)
Hemoglobin: 14.7 g/dL (ref 13.0–17.0)
Immature Granulocytes: 0 %
Lymphocytes Relative: 29 %
Lymphs Abs: 2.1 10*3/uL (ref 0.7–4.0)
MCH: 31.3 pg (ref 26.0–34.0)
MCHC: 31.5 g/dL (ref 30.0–36.0)
MCV: 99.1 fL (ref 80.0–100.0)
Monocytes Absolute: 0.7 10*3/uL (ref 0.1–1.0)
Monocytes Relative: 9 %
Neutro Abs: 4.2 10*3/uL (ref 1.7–7.7)
Neutrophils Relative %: 57 %
Platelets: 147 10*3/uL — ABNORMAL LOW (ref 150–400)
RBC: 4.7 MIL/uL (ref 4.22–5.81)
RDW: 13.3 % (ref 11.5–15.5)
WBC: 7.3 10*3/uL (ref 4.0–10.5)
nRBC: 0 % (ref 0.0–0.2)

## 2019-04-23 LAB — BASIC METABOLIC PANEL
Anion gap: 9 (ref 5–15)
BUN: 15 mg/dL (ref 8–23)
CO2: 29 mmol/L (ref 22–32)
Calcium: 8.9 mg/dL (ref 8.9–10.3)
Chloride: 100 mmol/L (ref 98–111)
Creatinine, Ser: 0.93 mg/dL (ref 0.61–1.24)
GFR calc Af Amer: >60 mL/min (ref >60)
GFR calc non Af Amer: >60 mL/min (ref >60)
Glucose, Bld: 195 mg/dL — ABNORMAL HIGH (ref 70–99)
Potassium: 4.5 mmol/L (ref 3.5–5.1)
Sodium: 138 mmol/L (ref 135–145)

## 2019-04-27 ENCOUNTER — Ambulatory Visit (INDEPENDENT_AMBULATORY_CARE_PROVIDER_SITE_OTHER): Payer: Medicare Other | Admitting: Cardiology

## 2019-04-27 ENCOUNTER — Encounter: Payer: Self-pay | Admitting: Cardiology

## 2019-04-27 VITALS — BP 125/70 | HR 64 | Temp 97.1°F | Ht 68.0 in | Wt 214.0 lb

## 2019-04-27 DIAGNOSIS — I4821 Permanent atrial fibrillation: Secondary | ICD-10-CM

## 2019-04-27 DIAGNOSIS — I25119 Atherosclerotic heart disease of native coronary artery with unspecified angina pectoris: Secondary | ICD-10-CM

## 2019-04-27 DIAGNOSIS — I10 Essential (primary) hypertension: Secondary | ICD-10-CM | POA: Diagnosis not present

## 2019-04-27 DIAGNOSIS — E782 Mixed hyperlipidemia: Secondary | ICD-10-CM | POA: Diagnosis not present

## 2019-04-27 NOTE — Patient Instructions (Signed)
Medication Instructions:  Your physician recommends that you continue on your current medications as directed. Please refer to the Current Medication list given to you today.  *If you need a refill on your cardiac medications before your next appointment, please call your pharmacy*  Lab Work: Cbc and [place] next visit in 6 months If you have labs (blood work) drawn today and your tests are completely normal, you will receive your results only by: Marland Kitchen MyChart Message (if you have MyChart) OR . A paper copy in the mail If you have any lab test that is abnormal or we need to change your treatment, we will call you to review the results.  Testing/Procedures: None today  Follow-Up: At [facility], you and your health needs are our priority.  As part of our continuing mission to provide you with exceptional heart care, we have created designated Provider Care Teams.  These Care Teams include your primary Cardiologist (physician) and Advanced Practice Providers (APPs -  Physician Assistants and Nurse Practitioners) who all work together to provide you with the care you need, when you need it.  Your next appointment:   6 month(s)  The format for your next appointment:   In Person  Provider:   Rozann Lesches, MD  Other Instructions None     Thank you for choosing [facility] !

## 2019-04-27 NOTE — Progress Notes (Signed)
Cardiology Office Note  Date: 04/27/2019   ID: Richard [name], DOB November 03, 1933, MRN [id number]  PCP:  [facility]  Cardiologist:  Rozann Lesches, MD Electrophysiologist:  None   Chief Complaint  Patient presents with  . Cardiac follow-up    History of Present Illness: Richard [name] is an 86 y.o. male last assessed via telehealth encounter in September 2020.  He presents for a routine visit.  He does not report any progressive angina symptoms or nitroglycerin use, no significant change in stamina or breathlessness.  He and his wife both have had the coronavirus vaccine.  We discussed follow-up lab work.  Recent CBC and BMET are outlined below, renal function stable and hemoglobin normal.  He does not report any obvious bleeding problems on Eliquis.  We went over the remainder of his medications which are listed below.  He is not certain whether he will be getting a lipid panel with his physical in April but plans to check with PCP, otherwise we can order this.  Past Medical History:  Diagnosis Date  . CAD (coronary artery disease)    Mild-moderate LM with occluded RCA (collaterals) - managed medically 2013  . GERD (gastroesophageal reflux disease)   . Hypertensive heart disease   . Obesity (BMI 30.0-34.9)   . Old inferior myocardial infarction 1995  . PAF (paroxysmal atrial fibrillation) ([place])   . Pulmonary embolus ([place])    [name] 2015    Past Surgical History:  Procedure Laterality Date  . ACHILLES TENDON REPAIR    . APPENDECTOMY    . LEFT HEART CATHETERIZATION WITH CORONARY ANGIOGRAM N/A 01/24/2012   Procedure: LEFT HEART CATHETERIZATION WITH CORONARY ANGIOGRAM;  Surgeon: Jacolyn Reedy, MD;  Location: [facility] C[facility] LAB;  Service: Cardiovascular;  Laterality: N/A;  . SHOULDER SURGERY      Current Outpatient Medications  Medication Sig Dispense Refill  . acetaminophen (TYLENOL) 500 MG tablet Take 1,000 mg every 6 (six) hours as needed by mouth  for mild pain or headache.    Marland Kitchen atorvastatin (LIPITOR) 10 MG tablet Take 10 mg by mouth every evening.    . diltiazem (CARDIZEM CD) 240 MG 24 hr capsule Take 1 capsule (240 mg total) by mouth daily. 90 capsule 3  . ELIQUIS 5 MG TABS tablet TAKE 1 TABLET BY MOUTH TWICE DAILY 60 tablet 6  . gabapentin (NEURONTIN) 300 MG capsule Take 300 mg by mouth 3 (three) times daily.  6  . isosorbide mononitrate (IMDUR) 30 MG 24 hr tablet TAKE 1 TABLET BY MOUTH ONCE DAILY IN THE EVENING 90 tablet 3  . losartan (COZAAR) 25 MG tablet TAKE 1 TABLET BY MOUTH ONCE DAILY 90 tablet 3  . meclizine (ANTIVERT) 12.5 MG tablet TAKE 1 TABLET BY MOUTH EVERY 4 TO 6 HOURS AS NEEDED FOR DIZZINESS  0  . metFORMIN (GLUCOPHAGE-XR) 500 MG 24 hr tablet Take 500 mg by mouth 2 (two) times daily.     . metoprolol succinate (TOPROL-XL) 50 MG 24 hr tablet TAKE 1 TABLET BY MOUTH TWICE DAILY. TAKE WITH OR IMMEDIATELY FOLLOWING A MEAL. 180 tablet 3  . nitroGLYCERIN (NITROSTAT) 0.4 MG SL tablet DISSOLVE ONE TABLET UNDER THE TONGUE EVERY 5 MINUTES AS NEEDED FOR CHEST PAIN.  DO NOT EXCEED A TOTAL OF 3 DOSES IN 15 MINUTES 25 tablet 3  . terbinafine (LAMISIL) 250 MG tablet Take 250 mg by mouth daily.  0   No current facility-administered medications for this visit.   Allergies:  Niaspan [niacin  er]   ROS:  No palpitations or syncope.  Physical Exam: VS:  BP 125/70   Pulse 64   Temp (!) 97.1 F (36.2 C)   Ht _0  (1.727 m)   Wt 214 lb (97.1 kg)   BMI 32.54 kg/m , BMI Body mass index is 32.54 kg/m.  Wt Readings from Last 3 Encounters:  04/27/19 214 lb (97.1 kg)  11/07/18 213 lb (96.6 kg)  05/08/18 215 lb 6.4 oz (97.7 kg)    General:  Elderly male, appears comfortable at rest. HEENT: Conjunctiva and lids normal, wearing a mask. Neck: Supple, no elevated JVP or carotid bruits, no thyromegaly. Lungs: Clear to auscultation, nonlabored breathing at rest. Cardiac: Irregularly irregular, no S3, soft systolic murmur. Abdomen: Soft,  nontender, bowel sounds present. Extremities: No pitting edema, distal pulses 2+. Skin: Warm and dry. Musculoskeletal: No kyphosis. Neuropsychiatric: Alert and oriented x3, affect grossly appropriate.  ECG:  An ECG dated 05/08/2018 was personally reviewed today and demonstrated:  Rate controlled atrial fibrillation with R' in lead V1.  Recent Labwork: 04/23/2019: BUN 15; Creatinine, Ser 0.93; Hemoglobin 14.7; Platelets 147; Potassium 4.5; Sodium 138   Other Studies Reviewed Today:  Echocardiogram 11/26/2015: Study Conclusions   - Left ventricle: The cavity size was normal. Wall thickness was  normal. Systolic function was normal. The estimated ejection  fraction was in the range of 55% to 60%. Although no diagnostic  regional wall motion abnormality was identified, this possibility  cannot be completely excluded on the basis of this study. The  study is not technically sufficient to allow evaluation of LV  diastolic function.  - Aortic valve: Mildly calcified annulus. Trileaflet; mildly  calcified leaflets. There was trivial regurgitation.  - Aortic root: The aortic root was mildly dilated.  - Mitral valve: Calcified annulus. There was trivial regurgitation.  - Left atrium: The atrium was moderately dilated.  - Right ventricle: The cavity size was mildly dilated. Systolic  function was mildly reduced.  - Right atrium: The atrium was mildly dilated. Central venous  pressure (est): 3 mm Hg.  - Tricuspid valve: There was mild regurgitation.  - Pulmonary arteries: PA peak pressure: 23 mm Hg (S).  - Pericardium, extracardiac: There was no pericardial effusion.   Impressions:   - Normal LV wall thickness with LVEF 55-60%. Indeterminate  diastolic function in the setting of atrial fibrillation.  Moderate left atrial enlargement. MAC with trivial mitral  regurgitation. Mildly dilated aortic root. Sclerotic aortic valve  with trivial aortic regurgitation. Mildly  dilated RV with mildly  reduced contraction. Mild tricuspid regurgitation with PASP 23  mmHg.   Cardiac catheterization 01/13/2017:  Prox RCA lesion is 100% stenosed.  Ost 1st Mrg to 1st Mrg lesion is 60% stenosed.  Ost Cx to Prox Cx lesion is 20% stenosed.  Ost 2nd Mrg lesion is 50% stenosed.  Prox LAD lesion is 30% stenosed.  Ost 1st Diag to 1st Diag lesion is 40% stenosed.  Ost LM lesion is 20% stenosed.   1. Chronic occlusion RCA with brisk collateral filling from left to right collaterals. This is known from cath in 2013.  2. Mild non-obstructive plaque in the left main and LAD. The second obtuse marginal branch has a moderate stenosis that does not appear to be flow limiting.  3. I did not cross the aortic valve due to radial artery spasm at the end of the case.   Recommendations: Continue medical management of CAD.   Assessment and Plan:  1.  CAD with  mild to moderate left system disease and occlusion of the RCA associated with left-to-right collaterals.  We continue on medical therapy, he does not describe any progressive angina.  Not on aspirin given use of Eliquis.  Otherwise continue Lipitor, Cardizem CD, Imdur, Toprol-XL, and Cozaar.  He has nitroglycerin available if needed.  2.  Permanent atrial fibrillation.  Heart rate control is adequate, he does not describe any palpitations.  Continue Cardizem CD and Toprol-XL.  Remains on Eliquis for stroke prophylaxis, I reviewed his recent lab work as discussed above.  3.  Mixed hyperlipidemia, continues on Lipitor.  If he does not get a follow-up lipid panel with [facility] at physical in April, we can set this up for him.  4.  Essential hypertension, blood pressure control looks good today.  No changes made.  Medication Adjustments/Labs and Tests Ordered: Current medicines are reviewed at length with the patient today.  Concerns regarding medicines are outlined above.   Tests Ordered: Orders Placed This Encounter    Procedures  . CBC  . Basic Metabolic Panel (BMET)    Medication Changes: No orders of the defined types were placed in this encounter.   Disposition:  Follow up 6 months in the [place] office.  Signed, Satira Sark, MD, [facility] 04/27/2019 1:27 PM    [facility] at [facility] [address] [place], [place] [address] Phone: [phone]; Fax: [phone]

## 2019-05-17 DIAGNOSIS — M62838 Other muscle spasm: Secondary | ICD-10-CM | POA: Diagnosis not present

## 2019-05-17 DIAGNOSIS — M545 Low back pain: Secondary | ICD-10-CM | POA: Diagnosis not present

## 2019-05-17 DIAGNOSIS — Z6833 Body mass index (BMI) 33.0-33.9, adult: Secondary | ICD-10-CM | POA: Diagnosis not present

## 2019-05-22 DIAGNOSIS — M79672 Pain in left foot: Secondary | ICD-10-CM | POA: Diagnosis not present

## 2019-05-22 DIAGNOSIS — M7741 Metatarsalgia, right foot: Secondary | ICD-10-CM | POA: Diagnosis not present

## 2019-05-22 DIAGNOSIS — M7742 Metatarsalgia, left foot: Secondary | ICD-10-CM | POA: Diagnosis not present

## 2019-05-22 DIAGNOSIS — M79671 Pain in right foot: Secondary | ICD-10-CM | POA: Diagnosis not present

## 2019-06-05 DIAGNOSIS — H35033 Hypertensive retinopathy, bilateral: Secondary | ICD-10-CM | POA: Diagnosis not present

## 2019-06-11 DIAGNOSIS — D1801 Hemangioma of skin and subcutaneous tissue: Secondary | ICD-10-CM | POA: Diagnosis not present

## 2019-06-11 DIAGNOSIS — D229 Melanocytic nevi, unspecified: Secondary | ICD-10-CM | POA: Diagnosis not present

## 2019-06-11 DIAGNOSIS — L819 Disorder of pigmentation, unspecified: Secondary | ICD-10-CM | POA: Diagnosis not present

## 2019-06-11 DIAGNOSIS — L821 Other seborrheic keratosis: Secondary | ICD-10-CM | POA: Diagnosis not present

## 2019-06-11 DIAGNOSIS — Z85828 Personal history of other malignant neoplasm of skin: Secondary | ICD-10-CM | POA: Diagnosis not present

## 2019-06-11 DIAGNOSIS — L905 Scar conditions and fibrosis of skin: Secondary | ICD-10-CM | POA: Diagnosis not present

## 2019-06-11 DIAGNOSIS — L570 Actinic keratosis: Secondary | ICD-10-CM | POA: Diagnosis not present

## 2019-06-11 DIAGNOSIS — L814 Other melanin hyperpigmentation: Secondary | ICD-10-CM | POA: Diagnosis not present

## 2019-06-21 DIAGNOSIS — M7741 Metatarsalgia, right foot: Secondary | ICD-10-CM | POA: Diagnosis not present

## 2019-06-21 DIAGNOSIS — M7742 Metatarsalgia, left foot: Secondary | ICD-10-CM | POA: Diagnosis not present

## 2019-07-27 DIAGNOSIS — I10 Essential (primary) hypertension: Secondary | ICD-10-CM | POA: Diagnosis not present

## 2019-07-27 DIAGNOSIS — G629 Polyneuropathy, unspecified: Secondary | ICD-10-CM | POA: Diagnosis not present

## 2019-07-27 DIAGNOSIS — E1165 Type 2 diabetes mellitus with hyperglycemia: Secondary | ICD-10-CM | POA: Diagnosis not present

## 2019-07-27 DIAGNOSIS — Z6832 Body mass index (BMI) 32.0-32.9, adult: Secondary | ICD-10-CM | POA: Diagnosis not present

## 2019-10-25 ENCOUNTER — Telehealth: Payer: Self-pay | Admitting: Cardiology

## 2019-10-25 DIAGNOSIS — E785 Hyperlipidemia, unspecified: Secondary | ICD-10-CM

## 2019-10-25 NOTE — Telephone Encounter (Signed)
Patient states that Dr Gwen Her said you would mail them an order for blood work for patient to have hsi cholesterol checked

## 2019-10-25 NOTE — Telephone Encounter (Signed)
Pt notified to have fasting lipid profile done before next visit.

## 2019-10-25 NOTE — Telephone Encounter (Signed)
Yes, let's go ahead and set up a fasting lipid profile.

## 2019-10-25 NOTE — Telephone Encounter (Signed)
Last lipid done 11/20. Please advise

## 2019-11-02 ENCOUNTER — Other Ambulatory Visit (HOSPITAL_COMMUNITY)
Admission: RE | Admit: 2019-11-02 | Discharge: 2019-11-02 | Disposition: A | Discharge status: Home / Self Care | Payer: Medicare Other | Source: Ambulatory Visit | Attending: Cardiology | Admitting: Cardiology

## 2019-11-02 ENCOUNTER — Other Ambulatory Visit: Payer: Self-pay

## 2019-11-02 DIAGNOSIS — I25119 Atherosclerotic heart disease of native coronary artery with unspecified angina pectoris: Secondary | ICD-10-CM | POA: Insufficient documentation

## 2019-11-02 DIAGNOSIS — E782 Mixed hyperlipidemia: Secondary | ICD-10-CM | POA: Diagnosis not present

## 2019-11-02 DIAGNOSIS — E785 Hyperlipidemia, unspecified: Secondary | ICD-10-CM | POA: Diagnosis not present

## 2019-11-02 DIAGNOSIS — I4821 Permanent atrial fibrillation: Secondary | ICD-10-CM | POA: Diagnosis not present

## 2019-11-02 LAB — BASIC METABOLIC PANEL
Anion gap: 9 (ref 5–15)
BUN: 16 mg/dL (ref 8–23)
CO2: 27 mmol/L (ref 22–32)
Calcium: 9 mg/dL (ref 8.9–10.3)
Chloride: 101 mmol/L (ref 98–111)
Creatinine, Ser: 0.9 mg/dL (ref 0.61–1.24)
GFR calc Af Amer: >60 mL/min (ref >60)
GFR calc non Af Amer: >60 mL/min (ref >60)
Glucose, Bld: 208 mg/dL — ABNORMAL HIGH (ref 70–99)
Potassium: 4.2 mmol/L (ref 3.5–5.1)
Sodium: 137 mmol/L (ref 135–145)

## 2019-11-02 LAB — CBC
HCT: 45.3 % (ref 39.0–52.0)
Hemoglobin: 14.5 g/dL (ref 13.0–17.0)
MCH: 30.9 pg (ref 26.0–34.0)
MCHC: 32 g/dL (ref 30.0–36.0)
MCV: 96.6 fL (ref 80.0–100.0)
Platelets: 133 10*3/uL — ABNORMAL LOW (ref 150–400)
RBC: 4.69 MIL/uL (ref 4.22–5.81)
RDW: 13.3 % (ref 11.5–15.5)
WBC: 7.5 10*3/uL (ref 4.0–10.5)
nRBC: 0 % (ref 0.0–0.2)

## 2019-11-02 LAB — LIPID PANEL
Cholesterol: 126 mg/dL (ref 0–200)
HDL: 34 mg/dL — ABNORMAL LOW (ref >40)
LDL Cholesterol: 67 mg/dL (ref 0–99)
Total CHOL/HDL Ratio: 3.7 RATIO
Triglycerides: 124 mg/dL (ref <150)
VLDL: 25 mg/dL (ref 0–40)

## 2019-11-07 ENCOUNTER — Encounter: Payer: Self-pay | Admitting: Cardiology

## 2019-11-07 ENCOUNTER — Other Ambulatory Visit: Payer: Self-pay

## 2019-11-07 ENCOUNTER — Ambulatory Visit (INDEPENDENT_AMBULATORY_CARE_PROVIDER_SITE_OTHER): Payer: Medicare Other | Admitting: Cardiology

## 2019-11-07 VITALS — BP 142/80 | HR 96 | Ht 68.0 in | Wt 209.0 lb

## 2019-11-07 DIAGNOSIS — Z79899 Other long term (current) drug therapy: Secondary | ICD-10-CM

## 2019-11-07 DIAGNOSIS — I4821 Permanent atrial fibrillation: Secondary | ICD-10-CM | POA: Diagnosis not present

## 2019-11-07 DIAGNOSIS — E782 Mixed hyperlipidemia: Secondary | ICD-10-CM | POA: Diagnosis not present

## 2019-11-07 DIAGNOSIS — I25119 Atherosclerotic heart disease of native coronary artery with unspecified angina pectoris: Secondary | ICD-10-CM | POA: Diagnosis not present

## 2019-11-07 NOTE — Patient Instructions (Signed)
Medication Instructions:  Your physician recommends that you continue on your current medications as directed. Please refer to the Current Medication list given to you today.  *If you need a refill on your cardiac medications before your next appointment, please call your pharmacy*   Lab Work: Cbc and bmet JUST BEFORE NEXT VISIT IN 6 MONTHS   If you have labs (blood work) drawn today and your tests are completely normal, you will receive your results only by: Marland Kitchen MyChart Message (if you have MyChart) OR . A paper copy in the mail If you have any lab test that is abnormal or we need to change your treatment, we will call you to review the results.   Testing/Procedures: None today   Follow-Up: At [facility], you and your health needs are our priority.  As part of our continuing mission to provide you with exceptional heart care, we have created designated Provider Care Teams.  These Care Teams include your primary Cardiologist (physician) and Advanced Practice Providers (APPs -  Physician Assistants and Nurse Practitioners) who all work together to provide you with the care you need, when you need it.  We recommend signing up for the patient portal called "MyChart".  Sign up information is provided on this After Visit Summary.  MyChart is used to connect with patients for Virtual Visits (Telemedicine).  Patients are able to view lab/test results, encounter notes, upcoming appointments, etc.  Non-urgent messages can be sent to your provider as well.   To learn more about what you can do with MyChart, go to [email or web address].    Your next appointment:   6 month(s)  The format for your next appointment:   In Person  Provider:   Rozann Lesches, MD   Other Instructions None       Thank you for choosing [facility] !

## 2019-11-07 NOTE — Progress Notes (Signed)
Cardiology Office Note  Date: 11/07/2019   ID: Richard [name], DOB 12/05/1933, MRN [id number]  PCP:  [facility]  Cardiologist:  Rozann Lesches, MD Electrophysiologist:  None   Chief Complaint  Patient presents with  . Cardiac follow-up    History of Present Illness: Richard [name] is an 86 y.o. male last seen in February.  Who presents for a routine visit.  Does not report any significant sense of palpitations or exertional chest pain.  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  I reviewed his recent lab work as noted below.  He does not report any bleeding problems on Eliquis.  Also remains on Cardizem CD and Toprol-XL for heart rate control.  I personally reviewed his ECG today which shows atrial fibrillation with low voltage, nonspecific T wave changes.  Past Medical History:  Diagnosis Date  . CAD (coronary artery disease)    Mild-moderate LM with occluded RCA (collaterals) - managed medically 2013  . GERD (gastroesophageal reflux disease)   . Hypertensive heart disease   . Obesity (BMI 30.0-34.9)   . Old inferior myocardial infarction 1995  . PAF (paroxysmal atrial fibrillation) ([place])   . Pulmonary embolus ([place])    [name] 2015    Past Surgical History:  Procedure Laterality Date  . ACHILLES TENDON REPAIR    . APPENDECTOMY    . LEFT HEART CATHETERIZATION WITH CORONARY ANGIOGRAM N/A 01/24/2012   Procedure: LEFT HEART CATHETERIZATION WITH CORONARY ANGIOGRAM;  Surgeon: Jacolyn Reedy, MD;  Location: [facility] CATH LAB;  Service: Cardiovascular;  Laterality: N/A;  . SHOULDER SURGERY      Current Outpatient Medications  Medication Sig Dispense Refill  . acetaminophen (TYLENOL) 500 MG tablet Take 1,000 mg every 6 (six) hours as needed by mouth for mild pain or headache.    Marland Kitchen atorvastatin (LIPITOR) 10 MG tablet Take 10 mg by mouth every evening.    . diltiazem  (CARDIZEM CD) 240 MG 24 hr capsule Take 1 capsule (240 mg total) by mouth daily. 90 capsule 3  . ELIQUIS 5 MG TABS tablet TAKE 1 TABLET BY MOUTH TWICE DAILY 60 tablet 6  . gabapentin (NEURONTIN) 300 MG capsule Take 300 mg by mouth 3 (three) times daily.  6  . isosorbide mononitrate (IMDUR) 30 MG 24 hr tablet TAKE 1 TABLET BY MOUTH ONCE DAILY IN THE EVENING 90 tablet 3  . losartan (COZAAR) 25 MG tablet TAKE 1 TABLET BY MOUTH ONCE DAILY 90 tablet 3  . metFORMIN (GLUCOPHAGE-XR) 500 MG 24 hr tablet Take 500 mg by mouth 2 (two) times daily.     . metoprolol succinate (TOPROL-XL) 50 MG 24 hr tablet TAKE 1 TABLET BY MOUTH TWICE DAILY. TAKE WITH OR IMMEDIATELY FOLLOWING A MEAL. 180 tablet 3  . nitroGLYCERIN (NITROSTAT) 0.4 MG SL tablet DISSOLVE ONE TABLET UNDER THE TONGUE EVERY 5 MINUTES AS NEEDED FOR CHEST PAIN.  DO NOT EXCEED A TOTAL OF 3 DOSES IN 15 MINUTES 25 tablet 3  . terbinafine (LAMISIL) 250 MG tablet Take 250 mg by mouth daily.  0   No current facility-administered medications for this visit.   Allergies:  Niaspan [niacin er]   ROS:   No dizziness or syncope.  Chronic hearing loss.  Physical Exam: VS:  BP (!) 142/80   Pulse 96   Ht _0  (1.727 m)   Wt 209 lb (94.8 kg)   SpO2  97%   BMI 31.78 kg/m , BMI Body mass index is 31.78 kg/m.  Wt Readings from Last 3 Encounters:  11/07/19 209 lb (94.8 kg)  04/27/19 214 lb (97.1 kg)  11/07/18 213 lb (96.6 kg)    General: Elderly male, appears comfortable at rest. HEENT: Conjunctiva and lids normal, wearing a mask. Neck: Supple, no elevated JVP or carotid bruits, no thyromegaly. Lungs: Clear to auscultation, nonlabored breathing at rest. Cardiac: Irregularly irregular, no S3, soft systolic murmur, no pericardial rub. Extremities: No pitting edema, distal pulses 2+.  ECG:  An ECG dated 05/08/2018 was personally reviewed today and demonstrated:  Rate controlled atrial fibrillation with R' in lead V1.  Recent Labwork: 11/02/2019: BUN 16;  Creatinine, Ser 0.90; Hemoglobin 14.5; Platelets 133; Potassium 4.2; Sodium 137     Component Value Date/Time   CHOL 126 11/02/2019 1004   TRIG 124 11/02/2019 1004   HDL 34 (L) 11/02/2019 1004   CHOLHDL 3.7 11/02/2019 1004   VLDL 25 11/02/2019 1004   LDLCALC 67 11/02/2019 1004    Other Studies Reviewed Today:  Echocardiogram 11/26/2015: Study Conclusions   - Left ventricle: The cavity size was normal. Wall thickness was  normal. Systolic function was normal. The estimated ejection  fraction was in the range of 55% to 60%. Although no diagnostic  regional wall motion abnormality was identified, this possibility  cannot be completely excluded on the basis of this study. The  study is not technically sufficient to allow evaluation of LV  diastolic function.  - Aortic valve: Mildly calcified annulus. Trileaflet; mildly  calcified leaflets. There was trivial regurgitation.  - Aortic root: The aortic root was mildly dilated.  - Mitral valve: Calcified annulus. There was trivial regurgitation.  - Left atrium: The atrium was moderately dilated.  - Right ventricle: The cavity size was mildly dilated. Systolic  function was mildly reduced.  - Right atrium: The atrium was mildly dilated. Central venous  pressure (est): 3 mm Hg.  - Tricuspid valve: There was mild regurgitation.  - Pulmonary arteries: PA peak pressure: 23 mm Hg (S).  - Pericardium, extracardiac: There was no pericardial effusion.   Impressions:   - Normal LV wall thickness with LVEF 55-60%. Indeterminate  diastolic function in the setting of atrial fibrillation.  Moderate left atrial enlargement. MAC with trivial mitral  regurgitation. Mildly dilated aortic root. Sclerotic aortic valve  with trivial aortic regurgitation. Mildly dilated RV with mildly  reduced contraction. Mild tricuspid regurgitation with PASP 23  mmHg.   Cardiac catheterization 01/13/2017:  Prox RCA lesion is 100%  stenosed.  Ost 1st Mrg to 1st Mrg lesion is 60% stenosed.  Ost Cx to Prox Cx lesion is 20% stenosed.  Ost 2nd Mrg lesion is 50% stenosed.  Prox LAD lesion is 30% stenosed.  Ost 1st Diag to 1st Diag lesion is 40% stenosed.  Ost LM lesion is 20% stenosed.  1. Chronic occlusion RCA with brisk collateral filling from left to right collaterals. This is known from cath in 2013.  2. Mild non-obstructive plaque in the left main and LAD. The second obtuse marginal branch has a moderate stenosis that does not appear to be flow limiting.  3. I did not cross the aortic valve due to radial artery spasm at the end of the case.   Recommendations: Continue medical management of CAD.   Assessment and Plan:  1.  Permanent atrial fibrillation.  CHA2DS2-VASc score is 3.  He is doing well without active palpitations, continues on combination of  Cardizem CD and Toprol-XL for heart rate control.  Continue Eliquis for stroke prophylaxis, recent lab work reviewed.  Follow-up CBC and BMET for next visit.  2.  Multivessel CAD, mild to moderate left system disease and occluded RCA with left-to-right collaterals.  Plan to continue medical therapy in the absence of progressive angina symptoms.  He is not on aspirin given use of Eliquis. Continue Toprol XL, Cozaar, Imdur, and Lipitor.  3. Mixed hyperlipidemia. Continue Lipitor. Recent LDL 67.  Medication Adjustments/Labs and Tests Ordered: Current medicines are reviewed at length with the patient today.  Concerns regarding medicines are outlined above.   Tests Ordered: Orders Placed This Encounter  Procedures  . CBC  . Basic Metabolic Panel (BMET)  . EKG 12-Lead    Medication Changes: No orders of the defined types were placed in this encounter.   Disposition:  Follow up 6 months in the [place] office.  Signed, Satira Sark, MD, [facility] 11/07/2019 9:02 AM    [facility] [place]. [address], [place], [place]  [address] Phone: [phone]; Fax: [phone]

## 2019-12-05 ENCOUNTER — Telehealth: Payer: Self-pay | Admitting: Cardiology

## 2019-12-05 NOTE — Telephone Encounter (Signed)
If bleeding is anticipated with tooth extraction, hold Eliquis 24 hours prior to procedure, and then resume the day after.

## 2019-12-05 NOTE — Telephone Encounter (Signed)
Dr Ileana Roup office made aware

## 2019-12-05 NOTE — Telephone Encounter (Signed)
Dr. Debby Bud is going to do a tooth extraction on Monday 12/10/2019 and would like to know if the pt needs to hold or stop his ELIQUIS 5 MG TABS tablet [[id number]]   Please give Dr. Joya Gaskins a call and let him know @ [phone]

## 2019-12-06 DIAGNOSIS — H353132 Nonexudative age-related macular degeneration, bilateral, intermediate dry stage: Secondary | ICD-10-CM | POA: Diagnosis not present

## 2019-12-12 ENCOUNTER — Other Ambulatory Visit: Payer: Self-pay

## 2019-12-12 ENCOUNTER — Encounter (INDEPENDENT_AMBULATORY_CARE_PROVIDER_SITE_OTHER): Payer: Medicare Other | Admitting: Ophthalmology

## 2019-12-12 DIAGNOSIS — H35033 Hypertensive retinopathy, bilateral: Secondary | ICD-10-CM

## 2019-12-12 DIAGNOSIS — H353132 Nonexudative age-related macular degeneration, bilateral, intermediate dry stage: Secondary | ICD-10-CM

## 2019-12-12 DIAGNOSIS — H43813 Vitreous degeneration, bilateral: Secondary | ICD-10-CM | POA: Diagnosis not present

## 2019-12-12 DIAGNOSIS — I10 Essential (primary) hypertension: Secondary | ICD-10-CM

## 2019-12-20 DIAGNOSIS — Z23 Encounter for immunization: Secondary | ICD-10-CM | POA: Diagnosis not present

## 2020-01-01 DIAGNOSIS — Z23 Encounter for immunization: Secondary | ICD-10-CM | POA: Diagnosis not present

## 2020-01-07 DIAGNOSIS — L299 Pruritus, unspecified: Secondary | ICD-10-CM | POA: Diagnosis not present

## 2020-01-07 DIAGNOSIS — L509 Urticaria, unspecified: Secondary | ICD-10-CM | POA: Diagnosis not present

## 2020-01-08 DIAGNOSIS — L249 Irritant contact dermatitis, unspecified cause: Secondary | ICD-10-CM | POA: Diagnosis not present

## 2020-01-29 DIAGNOSIS — G629 Polyneuropathy, unspecified: Secondary | ICD-10-CM | POA: Diagnosis not present

## 2020-01-29 DIAGNOSIS — Z6832 Body mass index (BMI) 32.0-32.9, adult: Secondary | ICD-10-CM | POA: Diagnosis not present

## 2020-01-29 DIAGNOSIS — E7800 Pure hypercholesterolemia, unspecified: Secondary | ICD-10-CM | POA: Diagnosis not present

## 2020-01-29 DIAGNOSIS — Z0000 Encounter for general adult medical examination without abnormal findings: Secondary | ICD-10-CM | POA: Diagnosis not present

## 2020-01-29 DIAGNOSIS — I10 Essential (primary) hypertension: Secondary | ICD-10-CM | POA: Diagnosis not present

## 2020-01-29 DIAGNOSIS — E1165 Type 2 diabetes mellitus with hyperglycemia: Secondary | ICD-10-CM | POA: Diagnosis not present

## 2020-03-11 DIAGNOSIS — L570 Actinic keratosis: Secondary | ICD-10-CM | POA: Diagnosis not present

## 2020-03-11 DIAGNOSIS — D485 Neoplasm of uncertain behavior of skin: Secondary | ICD-10-CM | POA: Diagnosis not present

## 2020-03-11 DIAGNOSIS — L821 Other seborrheic keratosis: Secondary | ICD-10-CM | POA: Diagnosis not present

## 2020-03-11 DIAGNOSIS — L819 Disorder of pigmentation, unspecified: Secondary | ICD-10-CM | POA: Diagnosis not present

## 2020-03-11 DIAGNOSIS — C44399 Other specified malignant neoplasm of skin of other parts of face: Secondary | ICD-10-CM | POA: Diagnosis not present

## 2020-03-11 DIAGNOSIS — L814 Other melanin hyperpigmentation: Secondary | ICD-10-CM | POA: Diagnosis not present

## 2020-03-11 DIAGNOSIS — D229 Melanocytic nevi, unspecified: Secondary | ICD-10-CM | POA: Diagnosis not present

## 2020-03-11 DIAGNOSIS — I8393 Asymptomatic varicose veins of bilateral lower extremities: Secondary | ICD-10-CM | POA: Diagnosis not present

## 2020-04-24 DIAGNOSIS — C44329 Squamous cell carcinoma of skin of other parts of face: Secondary | ICD-10-CM | POA: Diagnosis not present

## 2020-04-24 DIAGNOSIS — D485 Neoplasm of uncertain behavior of skin: Secondary | ICD-10-CM | POA: Diagnosis not present

## 2020-05-05 ENCOUNTER — Other Ambulatory Visit: Payer: Self-pay

## 2020-05-05 ENCOUNTER — Other Ambulatory Visit (HOSPITAL_COMMUNITY)
Admission: RE | Admit: 2020-05-05 | Discharge: 2020-05-05 | Disposition: A | Discharge status: Home / Self Care | Payer: Medicare Other | Source: Ambulatory Visit | Attending: Cardiology | Admitting: Cardiology

## 2020-05-05 DIAGNOSIS — Z79899 Other long term (current) drug therapy: Secondary | ICD-10-CM | POA: Diagnosis not present

## 2020-05-05 DIAGNOSIS — I25119 Atherosclerotic heart disease of native coronary artery with unspecified angina pectoris: Secondary | ICD-10-CM | POA: Diagnosis not present

## 2020-05-05 LAB — CBC
HCT: 39.3 % (ref 39.0–52.0)
Hemoglobin: 12.5 g/dL — ABNORMAL LOW (ref 13.0–17.0)
MCH: 31.3 pg (ref 26.0–34.0)
MCHC: 31.8 g/dL (ref 30.0–36.0)
MCV: 98.3 fL (ref 80.0–100.0)
Platelets: 130 10*3/uL — ABNORMAL LOW (ref 150–400)
RBC: 4 MIL/uL — ABNORMAL LOW (ref 4.22–5.81)
RDW: 13.8 % (ref 11.5–15.5)
WBC: 6.8 10*3/uL (ref 4.0–10.5)
nRBC: 0 % (ref 0.0–0.2)

## 2020-05-05 LAB — BASIC METABOLIC PANEL
Anion gap: 11 (ref 5–15)
BUN: 13 mg/dL (ref 8–23)
CO2: 27 mmol/L (ref 22–32)
Calcium: 8.8 mg/dL — ABNORMAL LOW (ref 8.9–10.3)
Chloride: 102 mmol/L (ref 98–111)
Creatinine, Ser: 0.76 mg/dL (ref 0.61–1.24)
GFR, Estimated: >60 mL/min (ref >60)
Glucose, Bld: 207 mg/dL — ABNORMAL HIGH (ref 70–99)
Potassium: 3.6 mmol/L (ref 3.5–5.1)
Sodium: 140 mmol/L (ref 135–145)

## 2020-05-08 NOTE — Progress Notes (Signed)
Cardiology Office Note  Date: 05/09/2020   ID: Richard [name], DOB [birth year], MRN [id number]  PCP:  [facility]  Cardiologist:  Rozann Lesches, MD Electrophysiologist:  None   Chief Complaint  Patient presents with  . Cardiac follow-up    History of Present Illness: Richard [name] is an 87 y.o. male last seen in September 2021. He presents for a routine follow-up visit.  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  He does not report any progressive angina symptoms on current medical therapy.  I reviewed his medications which are outlined below.  We discussed increasing losartan to 50 mg daily. Recent lab work is outlined below.  He does not report any spontaneous bleeding problems on Eliquis.  Past Medical History:  Diagnosis Date  . CAD (coronary artery disease)    Mild-moderate LM with occluded RCA (collaterals) - managed medically 2013  . GERD (gastroesophageal reflux disease)   . Hypertensive heart disease   . Obesity (BMI 30.0-34.9)   . Old inferior myocardial infarction 1995  . PAF (paroxysmal atrial fibrillation) ([place])   . Pulmonary embolus ([place])    [name] 2015    Past Surgical History:  Procedure Laterality Date  . ACHILLES TENDON REPAIR    . APPENDECTOMY    . LEFT HEART CATHETERIZATION WITH CORONARY ANGIOGRAM N/A 01/24/2012   Procedure: LEFT HEART CATHETERIZATION WITH CORONARY ANGIOGRAM;  Surgeon: Jacolyn Reedy, MD;  Location: [facility] CATH LAB;  Service: Cardiovascular;  Laterality: N/A;  . SHOULDER SURGERY      Current Outpatient Medications  Medication Sig Dispense Refill  . acetaminophen (TYLENOL) 500 MG tablet Take 1,000 mg every 6 (six) hours as needed by mouth for mild pain or headache.    Marland Kitchen atorvastatin (LIPITOR) 10 MG tablet Take 10 mg by mouth every evening.    . diltiazem (CARDIZEM CD) 240 MG 24 hr capsule Take 1 capsule (240 mg total) by mouth daily. 90 capsule 3  . ELIQUIS 5 MG TABS tablet TAKE 1 TABLET BY MOUTH  TWICE DAILY 60 tablet 6  . gabapentin (NEURONTIN) 300 MG capsule Take 300 mg by mouth 3 (three) times daily.  6  . isosorbide mononitrate (IMDUR) 30 MG 24 hr tablet TAKE 1 TABLET BY MOUTH ONCE DAILY IN THE EVENING 90 tablet 3  . losartan (COZAAR) 50 MG tablet Take 1 tablet (50 mg total) by mouth daily. 90 tablet 3  . metFORMIN (GLUCOPHAGE-XR) 500 MG 24 hr tablet Take 500 mg by mouth 2 (two) times daily.     . metoprolol succinate (TOPROL-XL) 50 MG 24 hr tablet TAKE 1 TABLET BY MOUTH TWICE DAILY. TAKE WITH OR IMMEDIATELY FOLLOWING A MEAL. 180 tablet 3  . nitroGLYCERIN (NITROSTAT) 0.4 MG SL tablet DISSOLVE ONE TABLET UNDER THE TONGUE EVERY 5 MINUTES AS NEEDED FOR CHEST PAIN.  DO NOT EXCEED A TOTAL OF 3 DOSES IN 15 MINUTES 25 tablet 3  . terbinafine (LAMISIL) 250 MG tablet Take 250 mg by mouth daily.  0   No current facility-administered medications for this visit.   Allergies:  Niaspan [niacin er]   ROS: Skin cancer surgery.  Physical Exam: VS:  BP (!) 150/90   Pulse 94   Ht _0  (1.727 m)   Wt 208 lb (94.3 kg)   SpO2 95%   BMI 31.63 kg/m , BMI Body mass index is 31.63 kg/m.  Wt Readings from Last 3 Encounters:  05/09/20 208 lb (94.3 kg)  11/07/19 209 lb (94.8 kg)  04/27/19 214 lb (97.1 kg)    General: Elderly male, appears comfortable at rest. HEENT: Conjunctiva and lids normal, wearing a mask. Neck: Supple, no elevated JVP or carotid bruits, no thyromegaly. Lungs: Clear to auscultation, nonlabored breathing at rest. Cardiac: Irregularly irregular, no S3, 2/6 systolic murmur, no pericardial rub. Extremities: No pitting edema.  ECG:  An ECG dated 11/07/2019 was personally reviewed today and demonstrated:  Atrial fibrillation with low voltage, nonspecific T wave changes.  Recent Labwork: 05/05/2020: BUN 13; Creatinine, Ser 0.76; Hemoglobin 12.5; Platelets 130; Potassium 3.6; Sodium 140     Component Value Date/Time   CHOL 126 11/02/2019 1004   TRIG 124 11/02/2019 1004   HDL 34  (L) 11/02/2019 1004   CHOLHDL 3.7 11/02/2019 1004   VLDL 25 11/02/2019 1004   LDLCALC 67 11/02/2019 1004    Other Studies Reviewed Today:  Echocardiogram 11/26/2015: Study Conclusions   - Left ventricle: The cavity size was normal. Wall thickness was  normal. Systolic function was normal. The estimated ejection  fraction was in the range of 55% to 60%. Although no diagnostic  regional wall motion abnormality was identified, this possibility  cannot be completely excluded on the basis of this study. The  study is not technically sufficient to allow evaluation of LV  diastolic function.  - Aortic valve: Mildly calcified annulus. Trileaflet; mildly  calcified leaflets. There was trivial regurgitation.  - Aortic root: The aortic root was mildly dilated.  - Mitral valve: Calcified annulus. There was trivial regurgitation.  - Left atrium: The atrium was moderately dilated.  - Right ventricle: The cavity size was mildly dilated. Systolic  function was mildly reduced.  - Right atrium: The atrium was mildly dilated. Central venous  pressure (est): 3 mm Hg.  - Tricuspid valve: There was mild regurgitation.  - Pulmonary arteries: PA peak pressure: 23 mm Hg (S).  - Pericardium, extracardiac: There was no pericardial effusion.   Impressions:   - Normal LV wall thickness with LVEF 55-60%. Indeterminate  diastolic function in the setting of atrial fibrillation.  Moderate left atrial enlargement. MAC with trivial mitral  regurgitation. Mildly dilated aortic root. Sclerotic aortic valve  with trivial aortic regurgitation. Mildly dilated RV with mildly  reduced contraction. Mild tricuspid regurgitation with PASP 23  mmHg.  Cardiac catheterization 01/13/2017:  Prox RCA lesion is 100% stenosed.  Ost 1st Mrg to 1st Mrg lesion is 60% stenosed.  Ost Cx to Prox Cx lesion is 20% stenosed.  Ost 2nd Mrg lesion is 50% stenosed.  Prox LAD lesion is 30%  stenosed.  Ost 1st Diag to 1st Diag lesion is 40% stenosed.  Ost LM lesion is 20% stenosed.  1. Chronic occlusion RCA with brisk collateral filling from left to right collaterals. This is known from cath in 2013.  2. Mild non-obstructive plaque in the left main and LAD. The second obtuse marginal branch has a moderate stenosis that does not appear to be flow limiting.  3. I did not cross the aortic valve due to radial artery spasm at the end of the case.   Recommendations: Continue medical management of CAD.  Assessment and Plan:  1.  Multivessel CAD, mild to moderate left system disease with occluded RCA associated with left-to-right collaterals.  He reports no active angina at this time with typical activities.  Continue Lipitor, Imdur, Toprol-XL, losartan, and as needed nitroglycerin.  2.  Permanent atrial fibrillation.  CHA2DS2-VASc score is 3.  He continues on Eliquis for stroke prophylaxis.  Recent lab  work reviewed.  He does not report any spontaneous bleeding problems.  Continue Cardizem CD and Toprol-XL for heart rate control.  3.  Essential hypertension, increase losartan to 50 mg daily.  Medication Adjustments/Labs and Tests Ordered: Current medicines are reviewed at length with the patient today.  Concerns regarding medicines are outlined above.   Tests Ordered: Orders Placed This Encounter  Procedures  . CBC  . Basic metabolic panel  . Lipid panel    Medication Changes: Meds ordered this encounter  Medications  . losartan (COZAAR) 50 MG tablet    Sig: Take 1 tablet (50 mg total) by mouth daily.    Dispense:  90 tablet    Refill:  3    05/09/20 dose increased to 50 mg qd    Disposition:  Follow up 6 months in the [place] office with repeat lab work.  Signed, Satira Sark, MD, [facility] 05/09/2020 9:58 AM     [facility]. [address] [place], [place] [address] Phone: [phone]; Fax: [phone]

## 2020-05-09 ENCOUNTER — Other Ambulatory Visit: Payer: Self-pay

## 2020-05-09 ENCOUNTER — Ambulatory Visit (INDEPENDENT_AMBULATORY_CARE_PROVIDER_SITE_OTHER): Payer: Medicare Other | Admitting: Cardiology

## 2020-05-09 ENCOUNTER — Encounter: Payer: Self-pay | Admitting: Cardiology

## 2020-05-09 VITALS — BP 150/90 | HR 94 | Ht 68.0 in | Wt 208.0 lb

## 2020-05-09 DIAGNOSIS — I4821 Permanent atrial fibrillation: Secondary | ICD-10-CM

## 2020-05-09 DIAGNOSIS — I10 Essential (primary) hypertension: Secondary | ICD-10-CM | POA: Diagnosis not present

## 2020-05-09 DIAGNOSIS — I25119 Atherosclerotic heart disease of native coronary artery with unspecified angina pectoris: Secondary | ICD-10-CM | POA: Diagnosis not present

## 2020-05-09 MED ORDER — LOSARTAN POTASSIUM 50 MG PO TABS: 50.0000 mg | ORAL_TABLET | Freq: Every day | ORAL | 3 refills | Status: DC

## 2020-05-09 NOTE — Patient Instructions (Signed)
Medication Instructions:  INCREASE Cozaar to 50 mg daily   *If you need a refill on your cardiac medications before your next appointment, please call your pharmacy*   Lab Work: IN 6 MONTHS CBC,BMET, Fasting Lipids (Just before visit)   If you have labs (blood work) drawn today and your tests are completely normal, you will receive your results only by: Marland Kitchen MyChart Message (if you have MyChart) OR . A paper copy in the mail If you have any lab test that is abnormal or we need to change your treatment, we will call you to review the results.   Testing/Procedures: None today   Follow-Up: At [facility], you and your health needs are our priority.  As part of our continuing mission to provide you with exceptional heart care, we have created designated Provider Care Teams.  These Care Teams include your primary Cardiologist (physician) and Advanced Practice Providers (APPs -  Physician Assistants and Nurse Practitioners) who all work together to provide you with the care you need, when you need it.  We recommend signing up for the patient portal called "MyChart".  Sign up information is provided on this After Visit Summary.  MyChart is used to connect with patients for Virtual Visits (Telemedicine).  Patients are able to view lab/test results, encounter notes, upcoming appointments, etc.  Non-urgent messages can be sent to your provider as well.   To learn more about what you can do with MyChart, go to [email or web address].    Your next appointment:   6 month(s)  The format for your next appointment:   In Person  Provider:   Rozann Lesches, MD   Other Instructions None    Thank you for choosing [facility] !

## 2020-05-18 ENCOUNTER — Other Ambulatory Visit: Payer: Self-pay | Admitting: Cardiology

## 2020-05-19 ENCOUNTER — Telehealth: Payer: Self-pay

## 2020-05-19 MED ORDER — NITROGLYCERIN 0.4 MG SL SUBL: SUBLINGUAL_TABLET | SUBLINGUAL | 2 refills | Status: DC

## 2020-05-19 NOTE — Telephone Encounter (Signed)
Medication refill request approved and sent to the pharmacy.   Nitroglycerin 0.4 mg SL

## 2020-05-29 DIAGNOSIS — C44329 Squamous cell carcinoma of skin of other parts of face: Secondary | ICD-10-CM | POA: Diagnosis not present

## 2020-05-29 DIAGNOSIS — C44319 Basal cell carcinoma of skin of other parts of face: Secondary | ICD-10-CM | POA: Diagnosis not present

## 2020-06-09 DIAGNOSIS — D0422 Carcinoma in situ of skin of left ear and external auricular canal: Secondary | ICD-10-CM | POA: Diagnosis not present

## 2020-06-09 DIAGNOSIS — D485 Neoplasm of uncertain behavior of skin: Secondary | ICD-10-CM | POA: Diagnosis not present

## 2020-06-09 DIAGNOSIS — C44319 Basal cell carcinoma of skin of other parts of face: Secondary | ICD-10-CM | POA: Diagnosis not present

## 2020-06-19 DIAGNOSIS — C4441 Basal cell carcinoma of skin of scalp and neck: Secondary | ICD-10-CM | POA: Diagnosis not present

## 2020-07-23 DIAGNOSIS — G629 Polyneuropathy, unspecified: Secondary | ICD-10-CM | POA: Diagnosis not present

## 2020-07-23 DIAGNOSIS — I10 Essential (primary) hypertension: Secondary | ICD-10-CM | POA: Diagnosis not present

## 2020-07-23 DIAGNOSIS — E1165 Type 2 diabetes mellitus with hyperglycemia: Secondary | ICD-10-CM | POA: Diagnosis not present

## 2020-07-23 DIAGNOSIS — Z6832 Body mass index (BMI) 32.0-32.9, adult: Secondary | ICD-10-CM | POA: Diagnosis not present

## 2020-07-23 DIAGNOSIS — E7801 Familial hypercholesterolemia: Secondary | ICD-10-CM | POA: Diagnosis not present

## 2020-09-01 ENCOUNTER — Telehealth: Payer: Self-pay | Admitting: Cardiology

## 2020-09-01 NOTE — Telephone Encounter (Signed)
Richard [name] called said Richard [name] was told by his Dentist-Dr. Hermina Barters referred him to [facility] for tooth# 10 abstraction on 09/15/20. He would like to know when to stop the Eliquist and when he should start back.

## 2020-09-01 NOTE — Telephone Encounter (Signed)
Recommend continuing Eliquis for single dental extraction.

## 2020-09-01 NOTE — Telephone Encounter (Signed)
I will forward to Edrick Oh, RN

## 2020-09-02 NOTE — Telephone Encounter (Signed)
I spoke with Richard [name] and relayed the message regarding no need to hold eliquis for single tooth extraction. He verbalized understanding.

## 2020-09-05 DIAGNOSIS — C44329 Squamous cell carcinoma of skin of other parts of face: Secondary | ICD-10-CM | POA: Diagnosis not present

## 2020-09-05 DIAGNOSIS — C44222 Squamous cell carcinoma of skin of right ear and external auricular canal: Secondary | ICD-10-CM | POA: Diagnosis not present

## 2020-09-05 DIAGNOSIS — L905 Scar conditions and fibrosis of skin: Secondary | ICD-10-CM | POA: Diagnosis not present

## 2020-09-05 DIAGNOSIS — Z85828 Personal history of other malignant neoplasm of skin: Secondary | ICD-10-CM | POA: Diagnosis not present

## 2020-09-05 DIAGNOSIS — D485 Neoplasm of uncertain behavior of skin: Secondary | ICD-10-CM | POA: Diagnosis not present

## 2020-09-05 DIAGNOSIS — L570 Actinic keratosis: Secondary | ICD-10-CM | POA: Diagnosis not present

## 2020-10-07 DIAGNOSIS — Z20828 Contact with and (suspected) exposure to other viral communicable diseases: Secondary | ICD-10-CM | POA: Diagnosis not present

## 2020-10-07 DIAGNOSIS — I482 Chronic atrial fibrillation, unspecified: Secondary | ICD-10-CM | POA: Diagnosis not present

## 2020-10-07 DIAGNOSIS — I259 Chronic ischemic heart disease, unspecified: Secondary | ICD-10-CM | POA: Diagnosis not present

## 2020-10-07 DIAGNOSIS — I10 Essential (primary) hypertension: Secondary | ICD-10-CM | POA: Diagnosis not present

## 2020-10-21 DIAGNOSIS — C44329 Squamous cell carcinoma of skin of other parts of face: Secondary | ICD-10-CM | POA: Diagnosis not present

## 2020-10-21 DIAGNOSIS — D0439 Carcinoma in situ of skin of other parts of face: Secondary | ICD-10-CM | POA: Diagnosis not present

## 2020-11-01 DIAGNOSIS — U071 COVID-19: Secondary | ICD-10-CM | POA: Diagnosis not present

## 2020-11-04 DIAGNOSIS — C44329 Squamous cell carcinoma of skin of other parts of face: Secondary | ICD-10-CM | POA: Diagnosis not present

## 2020-11-04 DIAGNOSIS — D0421 Carcinoma in situ of skin of right ear and external auricular canal: Secondary | ICD-10-CM | POA: Diagnosis not present

## 2020-11-04 DIAGNOSIS — L570 Actinic keratosis: Secondary | ICD-10-CM | POA: Diagnosis not present

## 2020-11-19 ENCOUNTER — Other Ambulatory Visit: Payer: Self-pay

## 2020-11-19 ENCOUNTER — Other Ambulatory Visit (HOSPITAL_COMMUNITY)
Admission: RE | Admit: 2020-11-19 | Discharge: 2020-11-19 | Disposition: A | Discharge status: Home / Self Care | Payer: Medicare Other | Source: Ambulatory Visit | Attending: Cardiology | Admitting: Cardiology

## 2020-11-19 DIAGNOSIS — I25119 Atherosclerotic heart disease of native coronary artery with unspecified angina pectoris: Secondary | ICD-10-CM | POA: Diagnosis not present

## 2020-11-19 LAB — BASIC METABOLIC PANEL
Anion gap: 12 (ref 5–15)
BUN: 15 mg/dL (ref 8–23)
CO2: 27 mmol/L (ref 22–32)
Calcium: 8.8 mg/dL — ABNORMAL LOW (ref 8.9–10.3)
Chloride: 100 mmol/L (ref 98–111)
Creatinine, Ser: 0.81 mg/dL (ref 0.61–1.24)
GFR, Estimated: >60 mL/min (ref >60)
Glucose, Bld: 206 mg/dL — ABNORMAL HIGH (ref 70–99)
Potassium: 4 mmol/L (ref 3.5–5.1)
Sodium: 139 mmol/L (ref 135–145)

## 2020-11-19 LAB — CBC
HCT: 44.5 % (ref 39.0–52.0)
Hemoglobin: 14.1 g/dL (ref 13.0–17.0)
MCH: 30.9 pg (ref 26.0–34.0)
MCHC: 31.7 g/dL (ref 30.0–36.0)
MCV: 97.6 fL (ref 80.0–100.0)
Platelets: 148 10*3/uL — ABNORMAL LOW (ref 150–400)
RBC: 4.56 MIL/uL (ref 4.22–5.81)
RDW: 14.1 % (ref 11.5–15.5)
WBC: 7.3 10*3/uL (ref 4.0–10.5)
nRBC: 0 % (ref 0.0–0.2)

## 2020-11-19 LAB — LIPID PANEL
Cholesterol: 130 mg/dL (ref 0–200)
HDL: 36 mg/dL — ABNORMAL LOW (ref >40)
LDL Cholesterol: 66 mg/dL (ref 0–99)
Total CHOL/HDL Ratio: 3.6 RATIO
Triglycerides: 138 mg/dL (ref <150)
VLDL: 28 mg/dL (ref 0–40)

## 2020-11-24 NOTE — Progress Notes (Signed)
Cardiology Office Note  Date: 11/25/2020   ID: Lans[place], Sigmon 01-12-1934, MRN [id number]  PCP:  [facility]  Cardiologist:  Rozann Lesches, MD Electrophysiologist:  None   Chief Complaint  Patient presents with   Cardiac follow-up    History of Present Illness: Richard [name] is an 87 y.o. male last seen in March.  He is here for a routine visit.  He does not report any active sense of palpitations, stable NYHA class II dyspnea on exertion and no angina symptoms.  He reports having COVID a few months ago, upper respiratory symptoms and headache as well as fatigue.  He was not hospitalized.  I reviewed his recent lab work which is outlined below.  We also went over his medications, he reports compliance, no spontaneous bleeding problems on Eliquis.  I personally reviewed his ECG today which shows atrial fibrillation with heart rate 106, nonspecific ST-T changes.  After being seated for several minutes his heart rate came down into the 80s.   Past Medical History:  Diagnosis Date   CAD (coronary artery disease)    Mild-moderate LM with occluded RCA (collaterals) - managed medically 2013   GERD (gastroesophageal reflux disease)    Hypertensive heart disease    Obesity (BMI 30.0-34.9)    Old inferior myocardial infarction 1995   PAF (paroxysmal atrial fibrillation) ([place])    Pulmonary embolus ([place])    [name] 2015    Past Surgical History:  Procedure Laterality Date   ACHILLES TENDON REPAIR     APPENDECTOMY     LEFT HEART CATHETERIZATION WITH CORONARY ANGIOGRAM N/A 01/24/2012   Procedure: LEFT HEART CATHETERIZATION WITH CORONARY ANGIOGRAM;  Surgeon: Jacolyn Reedy, MD;  Location: [facility] CATH LAB;  Service: Cardiovascular;  Laterality: N/A;   SHOULDER SURGERY      Current Outpatient Medications  Medication Sig Dispense Refill   acetaminophen (TYLENOL) 500 MG tablet Take 1,000 mg every 6 (six) hours as needed by mouth for mild pain or headache.      atorvastatin (LIPITOR) 10 MG tablet Take 10 mg by mouth every evening.     diltiazem (CARDIZEM CD) 240 MG 24 hr capsule Take 1 capsule (240 mg total) by mouth daily. 90 capsule 3   ELIQUIS 5 MG TABS tablet TAKE 1 TABLET BY MOUTH TWICE DAILY 60 tablet 6   gabapentin (NEURONTIN) 300 MG capsule Take 300 mg by mouth 3 (three) times daily.  6   isosorbide mononitrate (IMDUR) 30 MG 24 hr tablet TAKE 1 TABLET BY MOUTH ONCE DAILY IN THE EVENING 90 tablet 3   losartan (COZAAR) 50 MG tablet Take 1 tablet (50 mg total) by mouth daily. 90 tablet 3   metFORMIN (GLUCOPHAGE-XR) 500 MG 24 hr tablet Take 500 mg by mouth 2 (two) times daily.      metoprolol succinate (TOPROL-XL) 50 MG 24 hr tablet TAKE 1 TABLET BY MOUTH TWICE DAILY. TAKE WITH OR IMMEDIATELY FOLLOWING A MEAL. 180 tablet 3   nitroGLYCERIN (NITROSTAT) 0.4 MG SL tablet DISSOLVE 1 TABLET UNDER THE TONGUE EVERY 5 MINUTES AS NEEDED FOR CHEST PAIN. DO NOT EXCEED A TOTAL OF 3 DOSES IN 15 MINUTES. 25 tablet 2   terbinafine (LAMISIL) 250 MG tablet Take 250 mg by mouth daily.  0   No current facility-administered medications for this visit.   Allergies:  Niaspan [niacin er]   ROS: Hearing loss.  No syncope.  Physical Exam: VS:  BP (!) 142/84   Pulse 88  Ht _0  (1.727 m)   Wt 207 lb (93.9 kg)   SpO2 96%   BMI 31.47 kg/m , BMI Body mass index is 31.47 kg/m.  Wt Readings from Last 3 Encounters:  11/25/20 207 lb (93.9 kg)  05/09/20 208 lb (94.3 kg)  11/07/19 209 lb (94.8 kg)    General: Patient appears comfortable at rest. HEENT: Conjunctiva and lids normal, wearing a mask. Neck: Supple, no elevated JVP or carotid bruits, no thyromegaly. Lungs: Clear to auscultation, nonlabored breathing at rest. Cardiac: Irregularly irregular, no S3, 2/6 systolic murmur, no pericardial rub. Extremities: No pitting edema.  ECG:  An ECG dated 11/07/2019 was personally reviewed today and demonstrated:  Atrial fibrillation with low voltage, nonspecific T wave  changes.  Recent Labwork: 11/19/2020: BUN 15; Creatinine, Ser 0.81; Hemoglobin 14.1; Platelets 148; Potassium 4.0; Sodium 139     Component Value Date/Time   CHOL 130 11/19/2020 1018   TRIG 138 11/19/2020 1018   HDL 36 (L) 11/19/2020 1018   CHOLHDL 3.6 11/19/2020 1018   VLDL 28 11/19/2020 1018   LDLCALC 66 11/19/2020 1018    Other Studies Reviewed Today:  Echocardiogram 11/26/2015: Study Conclusions   - Left ventricle: The cavity size was normal. Wall thickness was    normal. Systolic function was normal. The estimated ejection    fraction was in the range of 55% to 60%. Although no diagnostic    regional wall motion abnormality was identified, this possibility    cannot be completely excluded on the basis of this study. The    study is not technically sufficient to allow evaluation of LV    diastolic function.  - Aortic valve: Mildly calcified annulus. Trileaflet; mildly    calcified leaflets. There was trivial regurgitation.  - Aortic root: The aortic root was mildly dilated.  - Mitral valve: Calcified annulus. There was trivial regurgitation.  - Left atrium: The atrium was moderately dilated.  - Right ventricle: The cavity size was mildly dilated. Systolic    function was mildly reduced.  - Right atrium: The atrium was mildly dilated. Central venous    pressure (est): 3 mm Hg.  - Tricuspid valve: There was mild regurgitation.  - Pulmonary arteries: PA peak pressure: 23 mm Hg (S).  - Pericardium, extracardiac: There was no pericardial effusion.   Impressions:   - Normal LV wall thickness with LVEF 55-60%. Indeterminate    diastolic function in the setting of atrial fibrillation.    Moderate left atrial enlargement. MAC with trivial mitral    regurgitation. Mildly dilated aortic root. Sclerotic aortic valve    with trivial aortic regurgitation. Mildly dilated RV with mildly    reduced contraction. Mild tricuspid regurgitation with PASP 23    mmHg.    Cardiac  catheterization 01/13/2017: Prox RCA lesion is 100% stenosed. Ost 1st Mrg to 1st Mrg lesion is 60% stenosed. Ost Cx to Prox Cx lesion is 20% stenosed. Ost 2nd Mrg lesion is 50% stenosed. Prox LAD lesion is 30% stenosed. Ost 1st Diag to 1st Diag lesion is 40% stenosed. Ost LM lesion is 20% stenosed.   1. Chronic occlusion RCA with brisk collateral filling from left to right collaterals. This is known from cath in 2013.  2. Mild non-obstructive plaque in the left main and LAD. The second obtuse marginal branch has a moderate stenosis that does not appear to be flow limiting.  3. I did not cross the aortic valve due to radial artery spasm at the end of the case.  Recommendations: Continue medical management of CAD.   Assessment and Plan:  1.  Permanent atrial fibrillation with CHA2DS2-VASc score of 3.  Plan to continue current heart rate control regimen including Cardizem CD and Toprol-XL.  His heart rate settled down to the 80s after being seated for several minutes.  He is on Eliquis for stroke prophylaxis, no reported bleeding problems.  I reviewed his recent lab work.  2.  Multivessel CAD, mild to moderate left system disease and occluded RCA associated with left-to-right collaterals which we have managed medically.  He does not report any progressive angina.  Continue Lipitor, Imdur, Toprol-XL, losartan, and as needed nitroglycerin.  3.  Type 2 diabetes mellitus, currently on Glucophage with follow-up by PCP.  He states that his blood sugars have been elevated.  He would be a reasonable candidate for Jardiance.  He would like to discuss this with his PCP first.  Medication Adjustments/Labs and Tests Ordered: Current medicines are reviewed at length with the patient today.  Concerns regarding medicines are outlined above.   Tests Ordered: Orders Placed This Encounter  Procedures   EKG 12-Lead    Medication Changes: No orders of the defined types were placed in this  encounter.   Disposition:  Follow up  6 months.  Signed, Satira Sark, MD, [facility] 11/25/2020 10:12 AM     [facility] [address], [place], [place] [address] Phone: [phone]; Fax: [phone]

## 2020-11-25 ENCOUNTER — Ambulatory Visit (INDEPENDENT_AMBULATORY_CARE_PROVIDER_SITE_OTHER): Payer: Medicare Other | Admitting: Cardiology

## 2020-11-25 ENCOUNTER — Other Ambulatory Visit: Payer: Self-pay

## 2020-11-25 ENCOUNTER — Encounter: Payer: Self-pay | Admitting: Cardiology

## 2020-11-25 VITALS — BP 142/84 | HR 88 | Ht 68.0 in | Wt 207.0 lb

## 2020-11-25 DIAGNOSIS — I4821 Permanent atrial fibrillation: Secondary | ICD-10-CM | POA: Diagnosis not present

## 2020-11-25 DIAGNOSIS — I25119 Atherosclerotic heart disease of native coronary artery with unspecified angina pectoris: Secondary | ICD-10-CM

## 2020-11-25 DIAGNOSIS — E1165 Type 2 diabetes mellitus with hyperglycemia: Secondary | ICD-10-CM | POA: Diagnosis not present

## 2020-11-25 DIAGNOSIS — H353112 Nonexudative age-related macular degeneration, right eye, intermediate dry stage: Secondary | ICD-10-CM | POA: Diagnosis not present

## 2020-11-25 NOTE — Patient Instructions (Signed)

## 2020-12-19 DIAGNOSIS — D0421 Carcinoma in situ of skin of right ear and external auricular canal: Secondary | ICD-10-CM | POA: Diagnosis not present

## 2020-12-19 DIAGNOSIS — L570 Actinic keratosis: Secondary | ICD-10-CM | POA: Diagnosis not present

## 2021-01-03 DIAGNOSIS — Z23 Encounter for immunization: Secondary | ICD-10-CM | POA: Diagnosis not present

## 2021-01-06 DIAGNOSIS — L923 Foreign body granuloma of the skin and subcutaneous tissue: Secondary | ICD-10-CM | POA: Diagnosis not present

## 2021-01-30 DIAGNOSIS — I482 Chronic atrial fibrillation, unspecified: Secondary | ICD-10-CM | POA: Diagnosis not present

## 2021-01-30 DIAGNOSIS — Z6832 Body mass index (BMI) 32.0-32.9, adult: Secondary | ICD-10-CM | POA: Diagnosis not present

## 2021-01-30 DIAGNOSIS — Z0000 Encounter for general adult medical examination without abnormal findings: Secondary | ICD-10-CM | POA: Diagnosis not present

## 2021-01-30 DIAGNOSIS — E1165 Type 2 diabetes mellitus with hyperglycemia: Secondary | ICD-10-CM | POA: Diagnosis not present

## 2021-01-30 DIAGNOSIS — G629 Polyneuropathy, unspecified: Secondary | ICD-10-CM | POA: Diagnosis not present

## 2021-03-27 DIAGNOSIS — R0602 Shortness of breath: Secondary | ICD-10-CM | POA: Diagnosis not present

## 2021-03-27 DIAGNOSIS — I259 Chronic ischemic heart disease, unspecified: Secondary | ICD-10-CM | POA: Diagnosis not present

## 2021-03-27 DIAGNOSIS — I2510 Atherosclerotic heart disease of native coronary artery without angina pectoris: Secondary | ICD-10-CM | POA: Diagnosis not present

## 2021-03-27 DIAGNOSIS — R059 Cough, unspecified: Secondary | ICD-10-CM | POA: Diagnosis not present

## 2021-03-27 DIAGNOSIS — Z6832 Body mass index (BMI) 32.0-32.9, adult: Secondary | ICD-10-CM | POA: Diagnosis not present

## 2021-03-30 ENCOUNTER — Telehealth: Payer: Self-pay | Admitting: Cardiology

## 2021-03-30 NOTE — Telephone Encounter (Signed)
Patient saw pcp Friday.He had cxr done and echo was ordered. They will proceed to get echo.

## 2021-03-30 NOTE — Telephone Encounter (Signed)
Pt wife is reaching out wanting to know how to proceed. States that pt is having a lot of congestion and discomfort.. would also like to know if pt is still going to have a CT done.. please advise

## 2021-03-31 DIAGNOSIS — R059 Cough, unspecified: Secondary | ICD-10-CM | POA: Diagnosis not present

## 2021-03-31 DIAGNOSIS — I080 Rheumatic disorders of both mitral and aortic valves: Secondary | ICD-10-CM | POA: Diagnosis not present

## 2021-03-31 DIAGNOSIS — I259 Chronic ischemic heart disease, unspecified: Secondary | ICD-10-CM | POA: Diagnosis not present

## 2021-03-31 DIAGNOSIS — R0602 Shortness of breath: Secondary | ICD-10-CM | POA: Diagnosis not present

## 2021-04-02 DIAGNOSIS — I482 Chronic atrial fibrillation, unspecified: Secondary | ICD-10-CM | POA: Diagnosis not present

## 2021-04-02 DIAGNOSIS — R059 Cough, unspecified: Secondary | ICD-10-CM | POA: Diagnosis not present

## 2021-04-02 DIAGNOSIS — Z6832 Body mass index (BMI) 32.0-32.9, adult: Secondary | ICD-10-CM | POA: Diagnosis not present

## 2021-04-02 DIAGNOSIS — G629 Polyneuropathy, unspecified: Secondary | ICD-10-CM | POA: Diagnosis not present

## 2021-05-07 ENCOUNTER — Emergency Department (HOSPITAL_COMMUNITY): Payer: Medicare Other

## 2021-05-07 ENCOUNTER — Encounter (HOSPITAL_COMMUNITY): Payer: Self-pay

## 2021-05-07 ENCOUNTER — Other Ambulatory Visit: Payer: Self-pay

## 2021-05-07 ENCOUNTER — Observation Stay (HOSPITAL_COMMUNITY)
Admission: EM | Admit: 2021-05-07 | Discharge: 2021-05-08 | Disposition: A | Discharge status: Home / Self Care | Payer: Medicare Other | Attending: Family Medicine | Admitting: Family Medicine

## 2021-05-07 ENCOUNTER — Inpatient Hospital Stay (HOSPITAL_COMMUNITY): Payer: Medicare Other

## 2021-05-07 DIAGNOSIS — I5030 Unspecified diastolic (congestive) heart failure: Secondary | ICD-10-CM

## 2021-05-07 DIAGNOSIS — Z79899 Other long term (current) drug therapy: Secondary | ICD-10-CM | POA: Insufficient documentation

## 2021-05-07 DIAGNOSIS — I25119 Atherosclerotic heart disease of native coronary artery with unspecified angina pectoris: Secondary | ICD-10-CM | POA: Diagnosis not present

## 2021-05-07 DIAGNOSIS — E785 Hyperlipidemia, unspecified: Secondary | ICD-10-CM | POA: Diagnosis not present

## 2021-05-07 DIAGNOSIS — I119 Hypertensive heart disease without heart failure: Secondary | ICD-10-CM | POA: Diagnosis not present

## 2021-05-07 DIAGNOSIS — I482 Chronic atrial fibrillation, unspecified: Secondary | ICD-10-CM | POA: Diagnosis not present

## 2021-05-07 DIAGNOSIS — I4891 Unspecified atrial fibrillation: Secondary | ICD-10-CM

## 2021-05-07 DIAGNOSIS — Z7901 Long term (current) use of anticoagulants: Secondary | ICD-10-CM | POA: Insufficient documentation

## 2021-05-07 DIAGNOSIS — R079 Chest pain, unspecified: Secondary | ICD-10-CM | POA: Diagnosis not present

## 2021-05-07 DIAGNOSIS — I110 Hypertensive heart disease with heart failure: Secondary | ICD-10-CM

## 2021-05-07 DIAGNOSIS — I259 Chronic ischemic heart disease, unspecified: Secondary | ICD-10-CM

## 2021-05-07 DIAGNOSIS — E119 Type 2 diabetes mellitus without complications: Secondary | ICD-10-CM | POA: Diagnosis not present

## 2021-05-07 DIAGNOSIS — Z20822 Contact with and (suspected) exposure to covid-19: Secondary | ICD-10-CM | POA: Insufficient documentation

## 2021-05-07 DIAGNOSIS — Z7984 Long term (current) use of oral hypoglycemic drugs: Secondary | ICD-10-CM | POA: Insufficient documentation

## 2021-05-07 DIAGNOSIS — I25110 Atherosclerotic heart disease of native coronary artery with unstable angina pectoris: Secondary | ICD-10-CM | POA: Diagnosis not present

## 2021-05-07 DIAGNOSIS — I200 Unstable angina: Secondary | ICD-10-CM | POA: Diagnosis not present

## 2021-05-07 DIAGNOSIS — I2510 Atherosclerotic heart disease of native coronary artery without angina pectoris: Secondary | ICD-10-CM | POA: Diagnosis present

## 2021-05-07 LAB — HEMOGLOBIN A1C
Hgb A1c MFr Bld: 8.2 % — ABNORMAL HIGH (ref 4.8–5.6)
Mean Plasma Glucose: 188.64 mg/dL

## 2021-05-07 LAB — BASIC METABOLIC PANEL
Anion gap: 9 (ref 5–15)
BUN: 14 mg/dL (ref 8–23)
CO2: 24 mmol/L (ref 22–32)
Calcium: 8.3 mg/dL — ABNORMAL LOW (ref 8.9–10.3)
Chloride: 104 mmol/L (ref 98–111)
Creatinine, Ser: 0.83 mg/dL (ref 0.61–1.24)
GFR, Estimated: >60 mL/min (ref >60)
Glucose, Bld: 184 mg/dL — ABNORMAL HIGH (ref 70–99)
Potassium: 3.9 mmol/L (ref 3.5–5.1)
Sodium: 137 mmol/L (ref 135–145)

## 2021-05-07 LAB — CBC
HCT: 39.9 % (ref 39.0–52.0)
Hemoglobin: 12.9 g/dL — ABNORMAL LOW (ref 13.0–17.0)
MCH: 31.2 pg (ref 26.0–34.0)
MCHC: 32.3 g/dL (ref 30.0–36.0)
MCV: 96.4 fL (ref 80.0–100.0)
Platelets: 159 10*3/uL (ref 150–400)
RBC: 4.14 MIL/uL — ABNORMAL LOW (ref 4.22–5.81)
RDW: 14.6 % (ref 11.5–15.5)
WBC: 7.9 10*3/uL (ref 4.0–10.5)
nRBC: 0 % (ref 0.0–0.2)

## 2021-05-07 LAB — LIPID PANEL
Cholesterol: 115 mg/dL (ref 0–200)
HDL: 34 mg/dL — ABNORMAL LOW (ref >40)
LDL Cholesterol: 61 mg/dL (ref 0–99)
Total CHOL/HDL Ratio: 3.4 RATIO
Triglycerides: 100 mg/dL (ref <150)
VLDL: 20 mg/dL (ref 0–40)

## 2021-05-07 LAB — RESP PANEL BY RT-PCR (FLU A&B, COVID) ARPGX2
Influenza A by PCR: NEGATIVE
Influenza B by PCR: NEGATIVE
SARS Coronavirus 2 by RT PCR: NEGATIVE

## 2021-05-07 LAB — TROPONIN I (HIGH SENSITIVITY)
Troponin I (High Sensitivity): 9 ng/L (ref <18)
Troponin I (High Sensitivity): 11 ng/L (ref <18)
Troponin I (High Sensitivity): 12 ng/L (ref <18)
Troponin I (High Sensitivity): 13 ng/L (ref <18)

## 2021-05-07 LAB — CBG MONITORING, ED: Glucose-Capillary: 126 mg/dL — ABNORMAL HIGH (ref 70–99)

## 2021-05-07 LAB — GLUCOSE, CAPILLARY: Glucose-Capillary: 195 mg/dL — ABNORMAL HIGH (ref 70–99)

## 2021-05-07 MED ORDER — ONDANSETRON HCL 4 MG/2ML IJ SOLN: 4.0000 mg | Freq: Four times a day (QID) | INTRAMUSCULAR | Status: DC | PRN

## 2021-05-07 MED ORDER — LIDOCAINE VISCOUS HCL 2 % MT SOLN
15.0000 mL | Freq: Once | OROMUCOSAL | Status: AC
  Administered 2021-05-07: 15 mL via ORAL
  Filled 2021-05-07: qty 15

## 2021-05-07 MED ORDER — HEPARIN BOLUS VIA INFUSION: 2000.0000 [IU] | Freq: Once | INTRAVENOUS | Status: DC

## 2021-05-07 MED ORDER — ASPIRIN 81 MG PO CHEW
324.0000 mg | CHEWABLE_TABLET | Freq: Once | ORAL | Status: AC
  Administered 2021-05-07: 324 mg via ORAL
  Filled 2021-05-07: qty 4

## 2021-05-07 MED ORDER — SODIUM CHLORIDE 0.9 % IV SOLN: INTRAVENOUS | Status: DC

## 2021-05-07 MED ORDER — HEPARIN SODIUM (PORCINE) 5000 UNIT/ML IJ SOLN: 5000.0000 [IU] | Freq: Three times a day (TID) | INTRAMUSCULAR | Status: DC

## 2021-05-07 MED ORDER — APIXABAN 5 MG PO TABS: 5.0000 mg | ORAL_TABLET | Freq: Two times a day (BID) | ORAL | Status: DC

## 2021-05-07 MED ORDER — HEPARIN (PORCINE) 25000 UT/250ML-% IV SOLN
1250.0000 [IU]/h | INTRAVENOUS | Status: DC
  Administered 2021-05-07: 22:00:00 1000 [IU]/h via INTRAVENOUS
  Filled 2021-05-07: qty 250

## 2021-05-07 MED ORDER — ISOSORBIDE MONONITRATE ER 60 MG PO TB24
30.0000 mg | ORAL_TABLET | Freq: Every day | ORAL | Status: DC
  Administered 2021-05-07 – 2021-05-08 (×2): 30 mg via ORAL
  Filled 2021-05-07 (×2): qty 1

## 2021-05-07 MED ORDER — INSULIN ASPART 100 UNIT/ML IJ SOLN
0.0000 [IU] | Freq: Three times a day (TID) | INTRAMUSCULAR | Status: DC
  Administered 2021-05-08: 1 [IU] via SUBCUTANEOUS
  Administered 2021-05-08: 2 [IU] via SUBCUTANEOUS

## 2021-05-07 MED ORDER — DILTIAZEM HCL ER COATED BEADS 240 MG PO CP24
240.0000 mg | ORAL_CAPSULE | Freq: Every day | ORAL | Status: DC
  Administered 2021-05-07 – 2021-05-08 (×2): 240 mg via ORAL
  Filled 2021-05-07 (×2): qty 1

## 2021-05-07 MED ORDER — ATORVASTATIN CALCIUM 40 MG PO TABS: 80.0000 mg | ORAL_TABLET | Freq: Every evening | ORAL | Status: DC

## 2021-05-07 MED ORDER — ACETAMINOPHEN 325 MG PO TABS: 650.0000 mg | ORAL_TABLET | ORAL | Status: DC | PRN

## 2021-05-07 MED ORDER — GABAPENTIN 300 MG PO CAPS
300.0000 mg | ORAL_CAPSULE | Freq: Two times a day (BID) | ORAL | Status: DC
  Administered 2021-05-07 – 2021-05-08 (×3): 300 mg via ORAL
  Filled 2021-05-07 (×3): qty 1

## 2021-05-07 MED ORDER — NITROGLYCERIN 0.4 MG SL SUBL
0.4000 mg | SUBLINGUAL_TABLET | SUBLINGUAL | Status: DC | PRN
  Administered 2021-05-07: 0.4 mg via SUBLINGUAL
  Filled 2021-05-07: qty 1

## 2021-05-07 MED ORDER — ALUM & MAG HYDROXIDE-SIMETH 200-200-20 MG/5ML PO SUSP
30.0000 mL | Freq: Once | ORAL | Status: AC
  Administered 2021-05-07: 30 mL via ORAL
  Filled 2021-05-07: qty 30

## 2021-05-07 MED ORDER — METOPROLOL SUCCINATE ER 50 MG PO TB24
50.0000 mg | ORAL_TABLET | Freq: Every day | ORAL | Status: DC
  Administered 2021-05-07 – 2021-05-08 (×2): 50 mg via ORAL
  Filled 2021-05-07 (×2): qty 1

## 2021-05-07 NOTE — Assessment & Plan Note (Signed)
-   Continuing home medication including Cardizem, Eliquis, metoprolol ?-Monitoring closely ?- ?

## 2021-05-07 NOTE — ED Triage Notes (Signed)
Reports having chest pain that started around 9-930. Associated with palpitations and sob.  Hx of afib on eliquis.  Reports this feels the same as his last heart attack.  ?

## 2021-05-07 NOTE — Assessment & Plan Note (Signed)
-   Currently stable, resuming home medication of Imdur, losartan, metoprolol, also on Cardizem ?-As needed IV hydralazine ?

## 2021-05-07 NOTE — H&P (Signed)
History and Physical   Patient: Richard [name]                            PCP: [facility]                    DOB: 1933-10-06            DOA: 05/07/2021 DQQ:[id number]             DOS: 05/07/2021, 3:30 PM  [facility]  Patient coming from:   HOME  I have personally reviewed patient's medical records, in electronic medical records, including:  [facility] link, and care everywhere.    Chief Complaint:   Chief Complaint  Patient presents with   Chest Pain    History of present illness:    Richard [name] is an 88 year old male with extensive history of HTN/HLD, DM 2/  A-fib - on Eliquis, previous MIs, extensive history of coronary artery disease with RCA occlusion presented with acute onset of chest pain.  Patient reporting this chest pain started this morning while having breakfast, describes the pain as a pressure type pain with radiation into his right arm, not associate with shortness of breath nausea vomiting or Was not diaphoretic.  He is concerned since his level of activity intolerance is low at baseline.  Shortness of breath with minimal exertion.   ED: Blood pressure 117/81, pulse (!) 105, temperature 98 F (36.7 C), temperature source Oral, resp. rate (!) 22, height 5\' 8"  (1.727 m), weight 96.2 kg, SpO2 97 %. -Troponin 9 -LDL 61, HDL 34, CBC, CMP within normal limits -EGD A-fib with diffuse T wave abnormalities     Patient Denies having: Fever, Chills, Cough, SOB, Chest Pain, Abd pain, N/V/D, headache, dizziness, lightheadedness,  Dysuria, Joint pain, rash, open wounds  ED Course:   Blood pressure 117/81, pulse (!) 105, temperature 98 F (36.7 C), temperature source Oral, resp. rate (!) 22, height 5\' 8"  (1.727 m), weight 96.2 kg, SpO2 97 %. Abnormal labs;   Review of Systems: As per HPI, otherwise 10 point review of systems were negative.    ----------------------------------------------------------------------------------------------------------------------  Allergies  Allergen Reactions   Niaspan [Niacin Er]     flushing    Home MEDs:  Prior to Admission medications   Medication Sig Start Date End Date Taking? Authorizing Provider  acetaminophen (TYLENOL) 500 MG tablet Take 1,000 mg every 6 (six) hours as needed by mouth for mild pain or headache.   Yes [provider]  atorvastatin (LIPITOR) 10 MG tablet Take 10 mg by mouth every evening.   Yes [provider]  diltiazem (CARDIZEM CD) 240 MG 24 hr capsule Take 1 capsule (240 mg total) by mouth daily. 05/13/17 05/07/21 Yes Satira Sark, MD  ELIQUIS 5 MG TABS tablet TAKE 1 TABLET BY MOUTH TWICE DAILY Patient taking differently: Take 5 mg by mouth 2 (two) times daily. 01/26/18  Yes Satira Sark, MD  gabapentin (NEURONTIN) 300 MG capsule Take 300 mg by mouth 2 (two) times daily. 10/20/17  Yes [provider]  isosorbide mononitrate (IMDUR) 30 MG 24 hr tablet TAKE 1 TABLET BY MOUTH ONCE DAILY IN THE EVENING Patient taking differently: Take 30 mg by mouth daily. 06/26/18  Yes Satira Sark, MD  losartan (COZAAR) 50 MG tablet Take 1 tablet (50 mg total) by mouth daily. Patient taking differently: Take 25 mg by mouth daily. 05/09/20 05/07/21  Yes Satira Sark, MD  metFORMIN (GLUCOPHAGE-XR) 500 MG 24 hr tablet Take 500 mg by mouth 2 (two) times daily.    Yes [provider]  metoprolol succinate (TOPROL-XL) 50 MG 24 hr tablet TAKE 1 TABLET BY MOUTH TWICE DAILY. TAKE WITH OR IMMEDIATELY FOLLOWING A MEAL. Patient taking differently: Take 50 mg by mouth in the morning and at bedtime. 02/12/19  Yes Satira Sark, MD  nitroGLYCERIN (NITROSTAT) 0.4 MG SL tablet DISSOLVE 1 TABLET UNDER THE TONGUE EVERY 5 MINUTES AS NEEDED FOR CHEST PAIN. DO NOT EXCEED A TOTAL OF 3 DOSES IN 15 MINUTES. Patient taking differently: Place 0.4 mg under the  tongue every 5 (five) minutes as needed for chest pain. 05/19/20  Yes Satira Sark, MD  terbinafine (LAMISIL) 250 MG tablet Take 250 mg by mouth daily. 10/20/17  Yes [provider]    PRN MEDs: acetaminophen, nitroGLYCERIN, ondansetron (ZOFRAN) IV  Past Medical History:  Diagnosis Date   CAD (coronary artery disease)    Mild-moderate LM with occluded RCA (collaterals) - managed medically 2013   GERD (gastroesophageal reflux disease)    Hypertensive heart disease    Obesity (BMI 30.0-34.9)    Old inferior myocardial infarction 1995   PAF (paroxysmal atrial fibrillation) ([place])    Pulmonary embolus ([place])    [name] 2015    Past Surgical History:  Procedure Laterality Date   ACHILLES TENDON REPAIR     APPENDECTOMY     LEFT HEART CATHETERIZATION WITH CORONARY ANGIOGRAM N/A 01/24/2012   Procedure: LEFT HEART CATHETERIZATION WITH CORONARY ANGIOGRAM;  Surgeon: Jacolyn Reedy, MD;  [facility] CATH LAB;  Service: Cardiovascular;  Laterality: N/A;   SHOULDER SURGERY       reports that he quit smoking about 28 years ago. He smoked an average of .25 packs per day. He has never used smokeless tobacco. He reports that he does not drink alcohol and does not use drugs.   Family History  Problem Relation Age of Onset   Heart failure Mother     Physical Exam:   Vitals:   05/07/21 1349 05/07/21 1350 05/07/21 1400 05/07/21 1435  BP:   117/78 117/81  Pulse: 85 83 74 (!) 105  Resp: (!) 22 (!) 21 (!) 22 (!) 22  Temp:      TempSrc:      SpO2: 96% 97% 97% 97%  Weight:      Height:       Constitutional: NAD, calm, comfortable Eyes: PERRL, lids and conjunctivae normal ENMT: Mucous membranes are moist. Posterior pharynx clear of any exudate or lesions.Normal dentition.  Neck: normal, supple, no masses, no thyromegaly Respiratory: clear to auscultation bilaterally, no wheezing, no crackles. Normal respiratory effort. No accessory muscle use.  Cardiovascular: Regular rate  and rhythm, no murmurs / rubs / gallops. No extremity edema. 2+ pedal pulses. No carotid bruits.  Abdomen: no tenderness, no masses palpated. No hepatosplenomegaly. Bowel sounds positive.  Musculoskeletal: no clubbing / cyanosis. No joint deformity upper and lower extremities. Good ROM, no contractures. Normal muscle tone.  Neurologic: CN II-XII grossly intact. Sensation intact, DTR normal. Strength 5/5 in all 4.  Psychiatric: Normal judgment and insight. Alert and oriented x 3. Normal mood.  Skin: no rashes, lesions, ulcers. No induration Decubitus/ulcers:  Wounds: per nursing documentation         Labs on admission:    I have personally reviewed following labs and imaging studies  CBC: Recent Labs  Lab 05/07/21 1220  WBC  7.9  HGB 12.9*  HCT 39.9  MCV 96.4  PLT 188   Basic Metabolic Panel: Recent Labs  Lab 05/07/21 1220  NA 137  K 3.9  CL 104  CO2 24  GLUCOSE 184*  BUN 14  CREATININE 0.83  CALCIUM 8.3*    Lipid Profile: Recent Labs    05/07/21 1501  CHOL 115  HDL 34*  LDLCALC 61  TRIG 100  CHOLHDL 3.4   Thyroid Function Tests: No results for input(s): TSH, T4TOTAL, FREET4, T3FREE, THYROIDAB in the last 72 hours. Anemia Panel: No results for input(s): VITAMINB12, FOLATE, FERRITIN, TIBC, IRON, RETICCTPCT in the last 72 hours. Urine analysis: No results found for: COLORURINE, APPEARANCEUR, LABSPEC, PHURINE, GLUCOSEU, HGBUR, BILIRUBINUR, KETONESUR, PROTEINUR, UROBILINOGEN, NITRITE, LEUKOCYTESUR  Last A1C:  Lab Results  Component Value Date   HGBA1C 6.5 (H) 01/21/2012     Radiologic Exams on Admission:   DG Chest 2 View  Result Date: 05/07/2021 CLINICAL DATA:  Chest pain. EXAM: CHEST - 2 VIEW COMPARISON:  Same day. FINDINGS: Stable cardiomediastinal silhouette. Both lungs are clear. The visualized skeletal structures are unremarkable. IMPRESSION: No active cardiopulmonary disease. Electronically Signed   By: Marijo Conception M.D.   On: 05/07/2021 15:27    DG Chest Portable 1 View  Result Date: 05/07/2021 CLINICAL DATA:  Chest pain EXAM: PORTABLE CHEST 1 VIEW COMPARISON:  None. FINDINGS: Low lung volumes. Mildly enlarged cardiac silhouette. No effusion, infiltrate or pneumothorax. IMPRESSION: Low lung volumes.  No acute cardiopulmonary process. Electronically Signed   By: Suzy Bouchard M.D.   On: 05/07/2021 11:47    EKG:   Independently reviewed.  Orders placed or performed during the hospital encounter of 05/07/21   ED EKG   ED EKG   ED EKG   EKG 12-Lead (at [facility])   ED EKG   ---------------------------------------------------------------------------------------------------------------------------------------    Assessment / Plan:   Principal Problem:   Chest pain Active Problems:   Hyperlipidemia   Hypertensive heart disease   CAD (coronary artery disease)   DM (diabetes mellitus) (HCC)   A-fib (HCC)   Assessment and Plan: * Chest pain - Patient will be admitted to telemetry floor under close observation -Recycle cardiac enzymes -Repeat EKG as needed -Cardiology consulted -further work-up per cardiology -Initiating aspirin, high-dose statins, PR nitroglycerin -As needed morphine -Extensive history of coronary artery disease with complete occlusion of right coronary-positive bilateral of the right Confirmed by last cardiac cath 2018 by Dr. Angelena Form  Hyperlipidemia - Continue statin  Hypertensive heart disease - Currently stable, resuming home medication of Imdur, losartan, metoprolol, also on Cardizem -As needed IV hydralazine  A-fib (HCC) - Continuing home medication including Cardizem, Eliquis, metoprolol -Monitoring closely -  DM (diabetes mellitus) ([place]) - Holding metformin -Currently n.p.o. -Checking hemoglobin A1c -We will monitor CBG QA CHS with SSI coverage  CAD (coronary artery disease) - Extensive history of coronary artery disease -History of inferior MI with RCA occlusion Last cath 2013  revealed left main stenosis 40%, 40% stenosis in diagonal occluded RCA -Continue current home medication including Eliquis  Including statins, beta-blockers, ACEi -Evaluating for acute MI    Consults called: Cardiology -------------------------------------------------------------------------------------------------------------------------------------------- DVT prophylaxis:  SCDs Start: 05/07/21 1456 apixaban (ELIQUIS) tablet 5 mg   Code Status:   Code Status: Full Code   Admission status: Patient will be admitted as inpatient Anticipating inpatient admission to rule out acute MI and evaluation by cardiology-high risk patient. Anticipating greater than 2 midnight stay.   Level of care: Telemetry  Family Communication:  none at bedside  (The above findings and plan of care has been discussed with patient in detail, the patient expressed understanding and agreement of above plan)  --------------------------------------------------------------------------------------------------------------------------------------------------  Disposition Plan:  Anticipated 1-2 days Status is: Inpatient Remains inpatient appropriate because: Needing cardiac work-up, risk patient with extensive coronary artery disease   ---------------------------------------------------------------------------------------------------------------  Time spent: > than  53  Min.   SIGNED: Deatra Granite, MD, FHM. [facility],  Pager (Please use [email or web address] to page to text)  If 7PM-7AM, please contact night-coverage [email or web address],  05/07/2021, 3:30 PM

## 2021-05-07 NOTE — Progress Notes (Signed)
ANTICOAGULATION CONSULT NOTE - Initial Consult ? ?Pharmacy Consult for heparin gtt  ?Indication: chest pain/ACS ? ?Allergies  ?Allergen Reactions  ? Niaspan [Niacin Er]   ?  flushing  ? ? ?Patient Measurements: ?Height: 5\' 8"  (172.7 cm) ?Weight: 96.2 kg (212 lb) ?IBW/kg (Calculated) : 68.4 ?Heparin Dosing Weight: HEPARIN DW (KG): 88.7 ? ? ?Vital Signs: ?Temp: 98 ?F (36.7 ?C) (03/02 1122) ?Temp Source: Oral (03/02 1122) ?BP: 117/81 (03/02 1435) ?Pulse Rate: 105 (03/02 1435) ? ?Labs: ?Recent Labs  ?  05/07/21 ?1220  ?HGB 12.9*  ?HCT 39.9  ?PLT 159  ?CREATININE 0.83  ? ? ?Estimated Creatinine Clearance: 73.2 mL/min (by C-G formula based on SCr of 0.83 mg/dL). ? ? ?Medical History: ?Past Medical History:  ?Diagnosis Date  ? CAD (coronary artery disease)   ? Mild-moderate LM with occluded RCA (collaterals) - managed medically 2013  ? GERD (gastroesophageal reflux disease)   ? Hypertensive heart disease   ? Obesity (BMI 30.0-34.9)   ? Old inferior myocardial infarction 1995  ? PAF (paroxysmal atrial fibrillation) ([place])   ? Pulmonary embolus ([place])   ? [name] 2015  ? ? ?Medications:  ?(Not in a hospital admission)  ?Scheduled:  ? alum & mag hydroxide-simeth  30 mL Oral Once  ? And  ? lidocaine  15 mL Oral Once  ? apixaban  5 mg Oral BID  ? atorvastatin  80 mg Oral QPM  ? diltiazem  240 mg Oral Daily  ? gabapentin  300 mg Oral BID  ? heparin  2,000 Units Intravenous Once  ? insulin aspart  0-6 Units Subcutaneous TID WC  ? isosorbide mononitrate  30 mg Oral Daily  ? metoprolol succinate  50 mg Oral Daily  ? ?Infusions:  ? sodium chloride    ? heparin    ? ?PRN: acetaminophen, nitroGLYCERIN, ondansetron (ZOFRAN) IV ?Anti-infectives (From admission, onward)  ? ? None  ? ?  ? ? ?Assessment: ?Richard [name] a 88 y.o. male requires anticoagulation with a heparin iv infusion for the indication of  chest pain/ACS. Heparin gtt will be started following pharmacy protocol per pharmacy consult. Patient is on previous oral  anticoagulant that will require aPTT/HL correlation before transitioning to only HL monitoring. Patient taking apixaban 5mg  po bid, last dose 3/2AM   ? ?Goal of Therapy:  ?Heparin level 0.3-0.7 units/ml ?aPTT 66-102 seconds ?Monitor platelets by anticoagulation protocol: Yes ?  ?Plan: starting heparin infusion 3/2 evening at time of next dose due for apixaban  ?Start heparin infusion at 1000 units/hr ?Check anti-Xa level in 8 hours and daily while on heparin ?Continue to monitor H&H and platelets ? ?Heparin level to be drawn in 8 hours for patients >63 years old  ? ?Richard [name] ?05/07/2021,3:11 PM ? ? ?

## 2021-05-07 NOTE — Assessment & Plan Note (Addendum)
-   Patient will be admitted to telemetry floor under close observation ?-Recycle cardiac enzymes ?-Repeat EKG as needed ?-Cardiology consulted -further work-up per cardiology ?-Initiating aspirin, high-dose statins, PR nitroglycerin ?-As needed morphine ?-Extensive history of coronary artery disease with complete occlusion of right coronary-positive bilateral of the right ?Confirmed by last cardiac cath 2018 by Dr. Angelena Form ?

## 2021-05-07 NOTE — ED Notes (Signed)
Pt headed to Xray ?

## 2021-05-07 NOTE — Assessment & Plan Note (Signed)
-   Extensive history of coronary artery disease ?-History of inferior MI with RCA occlusion ?Last cath 2013 revealed left main stenosis 40%, 40% stenosis in diagonal occluded RCA ?-Continue current home medication including Eliquis  ?Including statins, beta-blockers, ACEi ?-Evaluating for acute MI ?

## 2021-05-07 NOTE — Assessment & Plan Note (Signed)
-   Holding metformin ?-Currently n.p.o. ?-Checking hemoglobin A1c ?-We will monitor CBG QA CHS with SSI coverage ?

## 2021-05-07 NOTE — Assessment & Plan Note (Signed)
Continue statin. 

## 2021-05-07 NOTE — ED Provider Notes (Signed)
[facility] EMERGENCY DEPARTMENT Provider Note   CSN: [id number] Arrival date & time: 05/07/21  1103     History  Chief Complaint  Patient presents with   Chest Pain    Richard [name] is a 88 y.o. male.   Chest Pain  This patient is an 88 year old male, he has a known history of hypercholesterolemia, hypertension, diabetes, he does not smoke cigarettes but does have a history of chronic atrial fibrillation for which she takes Eliquis daily.  He has had a prior heart catheterization because of having a myocardial infarction but was told that he had collateral vessels and chronic obstructions.  No interventions were done at the time.  His last heart catheterization was in 2018 by Dr. Angelena Form.  He had a 100% right coronary lesion which was stenosed, he had various and sundry other 20 to 60% lesions which were obstructed.  There were collaterals from the left to the right.  He states that while he was eating breakfast today he developed acute onset of pain in the chest, pressure with radiation of the pain into his right arm.  He is not nauseated short of breath or diaphoretic.  He is concerned because this is similar to what he has had in the past when he had heart problems.  He states that his ability to exert himself and his level of exercise tolerance is extremely low at baseline and he becomes very weak and short of breath even going out to check the mail.  Denies recent illnesses including fevers chills nausea vomiting diarrhea swelling of the legs coughing or shortness of breath.  Home Medications Prior to Admission medications   Medication Sig Start Date End Date Taking? Authorizing Provider  acetaminophen (TYLENOL) 500 MG tablet Take 1,000 mg every 6 (six) hours as needed by mouth for mild pain or headache.   Yes [provider]  atorvastatin (LIPITOR) 10 MG tablet Take 10 mg by mouth every evening.   Yes [provider]  diltiazem (CARDIZEM CD) 240 MG 24 hr  capsule Take 1 capsule (240 mg total) by mouth daily. 05/13/17 05/07/21 Yes Satira Sark, MD  ELIQUIS 5 MG TABS tablet TAKE 1 TABLET BY MOUTH TWICE DAILY Patient taking differently: Take 5 mg by mouth 2 (two) times daily. 01/26/18  Yes Satira Sark, MD  gabapentin (NEURONTIN) 300 MG capsule Take 300 mg by mouth 2 (two) times daily. 10/20/17  Yes [provider]  isosorbide mononitrate (IMDUR) 30 MG 24 hr tablet TAKE 1 TABLET BY MOUTH ONCE DAILY IN THE EVENING Patient taking differently: Take 30 mg by mouth daily. 06/26/18  Yes Satira Sark, MD  losartan (COZAAR) 50 MG tablet Take 1 tablet (50 mg total) by mouth daily. Patient taking differently: Take 25 mg by mouth daily. 05/09/20 05/07/21 Yes Satira Sark, MD  metFORMIN (GLUCOPHAGE-XR) 500 MG 24 hr tablet Take 500 mg by mouth 2 (two) times daily.    Yes [provider]  metoprolol succinate (TOPROL-XL) 50 MG 24 hr tablet TAKE 1 TABLET BY MOUTH TWICE DAILY. TAKE WITH OR IMMEDIATELY FOLLOWING A MEAL. Patient taking differently: Take 50 mg by mouth in the morning and at bedtime. 02/12/19  Yes Satira Sark, MD  nitroGLYCERIN (NITROSTAT) 0.4 MG SL tablet DISSOLVE 1 TABLET UNDER THE TONGUE EVERY 5 MINUTES AS NEEDED FOR CHEST PAIN. DO NOT EXCEED A TOTAL OF 3 DOSES IN 15 MINUTES. Patient taking differently: Place 0.4 mg under the tongue every 5 (five) minutes  as needed for chest pain. 05/19/20  Yes Satira Sark, MD  terbinafine (LAMISIL) 250 MG tablet Take 250 mg by mouth daily. 10/20/17  Yes [provider]      Allergies    Niaspan [niacin er]    Review of Systems   Review of Systems  Cardiovascular:  Positive for chest pain.  All other systems reviewed and are negative.  Physical Exam Updated Vital Signs BP 117/78    Pulse 74    Temp 98 F (36.7 C) (Oral)    Resp (!) 22    Ht 1.727 m (5\' 8" )    Wt 96.2 kg    SpO2 97%    BMI 32.23 kg/m  Physical Exam Vitals and nursing note reviewed.   Constitutional:      General: He is not in acute distress.    Appearance: He is well-developed. He is not ill-appearing, toxic-appearing or diaphoretic.  HENT:     Head: Normocephalic and atraumatic.     Mouth/Throat:     Pharynx: No oropharyngeal exudate.  Eyes:     General: No scleral icterus.       Right eye: No discharge.        Left eye: No discharge.     Conjunctiva/sclera: Conjunctivae normal.     Pupils: Pupils are equal, round, and reactive to light.  Neck:     Thyroid: No thyromegaly.     Vascular: No JVD.  Cardiovascular:     Rate and Rhythm: Normal rate. Rhythm irregular.     Heart sounds: Normal heart sounds. No murmur heard.   No friction rub. No gallop.  Pulmonary:     Effort: Pulmonary effort is normal. No respiratory distress.     Breath sounds: Normal breath sounds. No wheezing or rales.  Abdominal:     General: Bowel sounds are normal. There is no distension.     Palpations: Abdomen is soft. There is no mass.     Tenderness: There is no abdominal tenderness.  Musculoskeletal:        General: No tenderness. Normal range of motion.     Cervical back: Normal range of motion and neck supple.     Right lower leg: Edema present.     Left lower leg: Edema present.     Comments: 1+ symmetrical lower extremity edema below the knees  Lymphadenopathy:     Cervical: No cervical adenopathy.  Skin:    General: Skin is warm and dry.     Findings: No erythema or rash.  Neurological:     Mental Status: He is alert.     Coordination: Coordination normal.  Psychiatric:        Behavior: Behavior normal.    ED Results / Procedures / Treatments   Labs (all labs ordered are listed, but only abnormal results are displayed) Labs Reviewed  BASIC METABOLIC PANEL - Abnormal; Notable for the following components:      Result Value   Glucose, Bld 184 (*)    Calcium 8.3 (*)    All other components within normal limits  CBC - Abnormal; Notable for the following components:    RBC 4.14 (*)    Hemoglobin 12.9 (*)    All other components within normal limits  TROPONIN I (HIGH SENSITIVITY)  TROPONIN I (HIGH SENSITIVITY)    EKG EKG Interpretation  Date/Time:  Thursday May 07 2021 11:18:56 EST Ventricular Rate:  81 PR Interval:    QRS Duration: 98 QT Interval:  364 QTC  Calculation: 422 R Axis:   63 Text Interpretation: Atrial fibrillation Incomplete right bundle branch block T wave abnormality, consider inferior ischemia Abnormal ECG When compared with ECG of 24-Feb-2017 03:47, PREVIOUS ECG IS PRESENT since last tracing no significant change Confirmed by Noemi Chapel [phone]) on 05/07/2021 11:26:03 AM  Radiology DG Chest Portable 1 View  Result Date: 05/07/2021 CLINICAL DATA:  Chest pain EXAM: PORTABLE CHEST 1 VIEW COMPARISON:  None. FINDINGS: Low lung volumes. Mildly enlarged cardiac silhouette. No effusion, infiltrate or pneumothorax. IMPRESSION: Low lung volumes.  No acute cardiopulmonary process. Electronically Signed   By: Suzy Bouchard M.D.   On: 05/07/2021 11:47    Procedures .Critical Care Performed by: Noemi Chapel, MD Authorized by: Noemi Chapel, MD   Critical care provider statement:    Critical care time (minutes):  30   Critical care time was exclusive of:  Separately billable procedures and treating other patients and teaching time   Critical care was necessary to treat or prevent imminent or life-threatening deterioration of the following conditions:  Cardiac failure   Critical care was time spent personally by me on the following activities:  Development of treatment plan with patient or surrogate, discussions with consultants, evaluation of patient's response to treatment, examination of patient, ordering and review of laboratory studies, ordering and review of radiographic studies, ordering and performing treatments and interventions, pulse oximetry, re-evaluation of patient's condition, review of old charts and obtaining history from  patient or surrogate   I assumed direction of critical care for this patient from another provider in my specialty: no     Care discussed with: admitting provider   Comments:          Medications Ordered in ED Medications  nitroGLYCERIN (NITROSTAT) SL tablet 0.4 mg (0.4 mg Sublingual Given 05/07/21 1243)  aspirin chewable tablet 324 mg (324 mg Oral Given 05/07/21 1225)    ED Course/ Medical Decision Making/ A&P                           Medical Decision Making Amount and/or Complexity of Data Reviewed Labs: ordered. Radiology: ordered.  Risk OTC drugs. Prescription drug management. Decision regarding hospitalization.   The EKG shows atrial fibrillation, there is no significant changes from prior.  His chest pain has been going on for about 2-1/2 hours, it is rather constant, seems to radiate into the right shoulder and was spontaneous in onset.  This patient has a known baseline chronic ischemia for which she has had collaterals in the past.  I think it reasonable to get a troponin and discussed with cardiology as the patient would likely need to be admitted to the hospital given his high risk status  ASA Nitroglycerin given.  This patient presents to the ED for concern of chest pain, this involves an extensive number of treatment options, and is a complaint that carries with it a high risk of complications and morbidity.  The differential diagnosis includes acute coronary syndrome, pulmonary embolism and coronary or aortic dissection seem much less likely.  Pericarditis less likely, EKG unremarkable   Co morbidities that complicate the patient evaluation  Chronic atrial fibrillation, anticoagulated, hypertension, diabetes, hypercholesterolemia   Additional history obtained:  Additional history obtained from electronic medical record External records from outside source obtained and reviewed including prior heart catheterization from 2018, see HPI   Lab Tests:  I Ordered,  and personally interpreted labs.  The pertinent results include: Troponin which was initially  unremarkable, metabolic panel with normal renal function, slight hyperglycemia and a CBC showing no anemia or leukocytosis.   Imaging Studies ordered:  I ordered imaging studies including portable chest x-ray I independently visualized and interpreted imaging which showed no acute findings in the chest I agree with the radiologist interpretation   Cardiac Monitoring:  The patient was maintained on a cardiac monitor.  I personally viewed and interpreted the cardiac monitored which showed an underlying rhythm of: Atrial fibrillation   Medicines ordered and prescription drug management:  I ordered medication including aspirin and nitroglycerin as well as a heparin drip for what appears to be an acute coronary syndrome Reevaluation of the patient after these medicines showed that the patient improved, but the patient is still having symptoms I have reviewed the patients home medicines and have made adjustments as needed   Test Considered:  CT scan of the chest but while the patient is on Eliquis I do not think an evaluation for pulmonary embolism is necessary   Critical Interventions:  Heparin drip Aspirin Nitroglycerin Consultation with cardiology   Consultations Obtained:  I requested consultation with the cardiologist, Dr. Gasper Sells,  and discussed lab and imaging findings as well as pertinent plan - they recommend: admission to the hospital, they will come and see the patient in consultation Discussed with hospitalist who will admit - Dr. Roger Shelter   Problem List / ED Course:  Patient still has chest pain but the pain has gotten significantly better with nitroglycerin and aspirin.  Heparin started at the request of the cardiologist, assuming this is an acute coronary syndrome until proven otherwise.  He is able to be admitted to this hospital according to cardiology, if  troponins uptrend he may need transfer    Social Determinants of Health:  None           Final Clinical Impression(s) / ED Diagnoses Final diagnoses:  Unstable angina [facility])    Rx / DC Orders ED Discharge Orders     None         Noemi Chapel, MD 05/07/21 1457

## 2021-05-07 NOTE — Hospital Course (Signed)
Richard [name] is an 88 year old male with extensive history of HTN/HLD, DM 2/  A-fib - on Eliquis, previous MIs, extensive history of coronary artery disease with RCA occlusion presented with acute onset of chest pain.  ?Patient reporting this chest pain started this morning while having breakfast, describes the pain as a pressure type pain with radiation into his right arm, not associate with shortness of breath nausea vomiting or ?Was not diaphoretic.  He is concerned since his level of activity intolerance is low at baseline.  Shortness of breath with minimal exertion.  ? ?ED: ?Blood pressure 117/81, pulse (!) 105, temperature 98 ?F (36.7 ?C), temperature source Oral, resp. rate (!) 22, height 5\' 8"  (1.727 m), weight 96.2 kg, SpO2 97 %. ?-Troponin 9 ?-LDL 61, HDL 34, CBC, CMP within normal limits ?-EGD A-fib with diffuse T wave abnormalities ?

## 2021-05-07 NOTE — Progress Notes (Signed)
Cardiology Consult Note   Patient ID: Richard [name] MRN: [id number]; DOB: 1933-08-22   Admission date: 05/07/2021  PCP:  [facility] Cardiologist:  Rozann Lesches, MD        Chief Complaint:  Chest pain   Patient Profile:   Richard [name] is a 88 y.o. male with HTN with DM, CAD s/p LHC in 2018, permanent atrial fibrillation who is being seen 05/07/2021 for the evaluation of chest pain.  History of Present Illness:   Richard [name] is having the first chest pain he has had since 2018.  Back in 2018, he chest pressure that radiated to his left arm.  He has nausea and diaphoresis.  He had received a normal eval from his docs at that time, went for for additional testing at the best of his cardiologist and found to have CAD.  Had [place] and found to have CTO disease with collaterals.  Patient notes that he is feeling more shortness of breath over that past six weeks.  He saw a PA was told her had HF; then had CXR and was told he didn't.  He had a non productive cough that resolved with only taking two doses of diuretic.  He gets winded with minimal exertion but attributes this to his AF.  He doesn't feel palpitations, but he thinks it may make his SOB.  He has a failr DCCV and the decision was made to not attempt further interventions. He denies leg swelling or weight gain.  He has  Had an echocardiogram done at [facility] 6 weeks ago I am unable to review.  This morning had chest pain that radiated to the right arm with breakfast.  He was worried this could be a heart attack.    As part of ED eval: Hsc troponin 12 Sublingual nitro given.   ASA given and maalox given. EKG AF Rate 88 Tele:  AF rates 54- 110  Past Medical History:  Diagnosis Date   CAD (coronary artery disease)    Mild-moderate LM with occluded RCA (collaterals) - managed medically 2013   GERD (gastroesophageal reflux disease)    Hypertensive heart disease    Obesity  (BMI 30.0-34.9)    Old inferior myocardial infarction 1995   PAF (paroxysmal atrial fibrillation) ([place])    Pulmonary embolus ([place])    [name] 2015    Past Surgical History:  Procedure Laterality Date   ACHILLES TENDON REPAIR     APPENDECTOMY     LEFT HEART CATHETERIZATION WITH CORONARY ANGIOGRAM N/A 01/24/2012   Procedure: LEFT HEART CATHETERIZATION WITH CORONARY ANGIOGRAM;  Surgeon: Jacolyn Reedy, MD;  Location: [facility] CATH LAB;  Service: Cardiovascular;  Laterality: N/A;   SHOULDER SURGERY       Medications Prior to Admission: Prior to Admission medications   Medication Sig Start Date End Date Taking? Authorizing Provider  acetaminophen (TYLENOL) 500 MG tablet Take 1,000 mg every 6 (six) hours as needed by mouth for mild pain or headache.   Yes [provider]  atorvastatin (LIPITOR) 10 MG tablet Take 10 mg by mouth every evening.   Yes [provider]  diltiazem (CARDIZEM CD) 240 MG 24 hr capsule Take 1 capsule (240 mg total) by mouth daily. 05/13/17 05/07/21 Yes Satira Sark, MD  ELIQUIS 5 MG TABS tablet TAKE 1 TABLET BY MOUTH TWICE DAILY Patient taking differently: Take 5 mg by mouth 2 (two) times daily. 01/26/18  Yes Satira Sark, MD  gabapentin (NEURONTIN)  300 MG capsule Take 300 mg by mouth 2 (two) times daily. 10/20/17  Yes [provider]  isosorbide mononitrate (IMDUR) 30 MG 24 hr tablet TAKE 1 TABLET BY MOUTH ONCE DAILY IN THE EVENING Patient taking differently: Take 30 mg by mouth daily. 06/26/18  Yes Satira Sark, MD  losartan (COZAAR) 50 MG tablet Take 1 tablet (50 mg total) by mouth daily. Patient taking differently: Take 25 mg by mouth daily. 05/09/20 05/07/21 Yes Satira Sark, MD  metFORMIN (GLUCOPHAGE-XR) 500 MG 24 hr tablet Take 500 mg by mouth 2 (two) times daily.    Yes [provider]  metoprolol succinate (TOPROL-XL) 50 MG 24 hr tablet TAKE 1 TABLET BY MOUTH TWICE DAILY. TAKE WITH OR IMMEDIATELY FOLLOWING A  MEAL. Patient taking differently: Take 50 mg by mouth in the morning and at bedtime. 02/12/19  Yes Satira Sark, MD  nitroGLYCERIN (NITROSTAT) 0.4 MG SL tablet DISSOLVE 1 TABLET UNDER THE TONGUE EVERY 5 MINUTES AS NEEDED FOR CHEST PAIN. DO NOT EXCEED A TOTAL OF 3 DOSES IN 15 MINUTES. Patient taking differently: Place 0.4 mg under the tongue every 5 (five) minutes as needed for chest pain. 05/19/20  Yes Satira Sark, MD  terbinafine (LAMISIL) 250 MG tablet Take 250 mg by mouth daily. 10/20/17  Yes [provider]     Allergies:    Allergies  Allergen Reactions   Niaspan [Niacin Er]     flushing    Social History:   Social History   Socioeconomic History   Marital status: Married    Spouse name: Not on file   Number of children: Not on file   Years of education: Not on file   Highest education level: Not on file  Occupational History   Not on file  Tobacco Use   Smoking status: Former    Packs/day: 0.25    Types: Cigarettes    Quit date: 03/08/1993    Years since quitting: 28.1   Smokeless tobacco: Never  Vaping Use   Vaping Use: Never used  Substance and Sexual Activity   Alcohol use: No   Drug use: No   Sexual activity: Not on file  Other Topics Concern   Not on file  Social History Narrative   Married, retired Optometrist Tobacco.  Works part time at the [employer] Strain: Not on [employer] Insecurity: Not on file  Transportation Needs: Not on file  Physical Activity: Not on file  Stress: Not on file  Social Connections: Not on file  Intimate Partner Violence: Not on file    Family History:   The patient's family history includes Heart failure in his mother.    ROS:  Please see the history of present illness.  All other ROS reviewed and negative.     Physical Exam/Data:   Vitals:   05/07/21 1350 05/07/21 1400 05/07/21 1435 05/07/21 1700  BP:  117/78 117/81 136/89  Pulse: 83 74 (!) 105  74  Resp: (!) 21 (!) 22 (!) 22   Temp:      TempSrc:      SpO2: 97% 97% 97%   Weight:      Height:       No intake or output data in the 24 hours ending 05/07/21 1738 Last 3 Weights 05/07/2021 11/25/2020 05/09/2020  Weight (lbs) 212 lb 207 lb 208 lb  Weight (kg) 96.163 kg 93.895 kg 94.348 kg  Body mass index is 32.23 kg/m.   Gen: no distress, Elderly male   Neck: No JVD,  carotid bruit Ears: Bilateral Pilar Plate Sign Cardiac: No Rubs or Gallops, no murmur, IRIR Respiratory: Clear to auscultation bilaterally, normal  respiratory rate GI: Soft, nontender, distended with slight fluid wave and dullness to percussion MS: No  edema;  moves all extremities Integument: Skin feels warm Neuro:  At time of evaluation, alert and oriented to person/place/time/situation  Psych: Normal affect, patient feels better   Laboratory Data:  High Sensitivity Troponin:   Recent Labs  Lab 05/07/21 1220 05/07/21 1429 05/07/21 1524  TROPONINIHS 9 11 12       Chemistry Recent Labs  Lab 05/07/21 1220  NA 137  K 3.9  CL 104  CO2 24  GLUCOSE 184*  BUN 14  CREATININE 0.83  CALCIUM 8.3*  GFRNONAA >60  ANIONGAP 9    No results for input(s): PROT, ALBUMIN, AST, ALT, ALKPHOS, BILITOT in the last 168 hours. Lipids  Recent Labs  Lab 05/07/21 1501  CHOL 115  TRIG 100  HDL 34*  LDLCALC 61  CHOLHDL 3.4   Hematology Recent Labs  Lab 05/07/21 1220  WBC 7.9  RBC 4.14*  HGB 12.9*  HCT 39.9  MCV 96.4  MCH 31.2  MCHC 32.3  RDW 14.6  PLT 159   Thyroid No results for input(s): TSH, FREET4 in the last 168 hours. BNPNo results for input(s): BNP, PROBNP in the last 168 hours.  DDimer No results for input(s): DDIMER in the last 168 hours.   Radiology/Studies:  DG Chest 2 View  Result Date: 05/07/2021 CLINICAL DATA:  Chest pain. EXAM: CHEST - 2 VIEW COMPARISON:  Same day. FINDINGS: Stable cardiomediastinal silhouette. Both lungs are clear. The visualized skeletal structures are unremarkable.  IMPRESSION: No active cardiopulmonary disease. Electronically Signed   By: Marijo Conception M.D.   On: 05/07/2021 15:27   DG Chest Portable 1 View  Result Date: 05/07/2021 CLINICAL DATA:  Chest pain EXAM: PORTABLE CHEST 1 VIEW COMPARISON:  None. FINDINGS: Low lung volumes. Mildly enlarged cardiac silhouette. No effusion, infiltrate or pneumothorax. IMPRESSION: Low lung volumes.  No acute cardiopulmonary process. Electronically Signed   By: Suzy Bouchard M.D.   On: 05/07/2021 11:47     Assessment and Plan:   Chest Pain with CAD and known CTO w/ collaterals HTN with DM Permanent AF - pending second troponin, if minimal increase will not plan for cath 05/08/21 - we will plan for a prognostic lexiscan, npo at midnight but does not need to hold his anti-anginal medications - given that large positive exam may lead to cath, will switch eliquis to heparin; I have discussed with Pharm D team - no change in meds at this time outside of above - unable to access recent echo - will send BNP - low threshold to repeat echo inpatient if worsening BNP given significant DOE  For questions or updates, please contact [place] Please consult [email or web address] for contact info under   Rudean Haskell, [place]  [address] [place], [place] [address] [phone]  6:00 PM

## 2021-05-08 ENCOUNTER — Inpatient Hospital Stay (HOSPITAL_BASED_OUTPATIENT_CLINIC_OR_DEPARTMENT_OTHER): Payer: Medicare Other

## 2021-05-08 ENCOUNTER — Encounter (HOSPITAL_COMMUNITY): Payer: Self-pay | Admitting: Family Medicine

## 2021-05-08 DIAGNOSIS — Z7984 Long term (current) use of oral hypoglycemic drugs: Secondary | ICD-10-CM | POA: Diagnosis not present

## 2021-05-08 DIAGNOSIS — Z20822 Contact with and (suspected) exposure to covid-19: Secondary | ICD-10-CM | POA: Diagnosis not present

## 2021-05-08 DIAGNOSIS — I4811 Longstanding persistent atrial fibrillation: Secondary | ICD-10-CM | POA: Diagnosis not present

## 2021-05-08 DIAGNOSIS — E785 Hyperlipidemia, unspecified: Secondary | ICD-10-CM | POA: Diagnosis not present

## 2021-05-08 DIAGNOSIS — I25110 Atherosclerotic heart disease of native coronary artery with unstable angina pectoris: Secondary | ICD-10-CM | POA: Diagnosis not present

## 2021-05-08 DIAGNOSIS — R079 Chest pain, unspecified: Secondary | ICD-10-CM | POA: Diagnosis not present

## 2021-05-08 DIAGNOSIS — I119 Hypertensive heart disease without heart failure: Secondary | ICD-10-CM | POA: Diagnosis not present

## 2021-05-08 DIAGNOSIS — Z7901 Long term (current) use of anticoagulants: Secondary | ICD-10-CM | POA: Diagnosis not present

## 2021-05-08 DIAGNOSIS — E119 Type 2 diabetes mellitus without complications: Secondary | ICD-10-CM | POA: Diagnosis not present

## 2021-05-08 DIAGNOSIS — I482 Chronic atrial fibrillation, unspecified: Secondary | ICD-10-CM | POA: Diagnosis not present

## 2021-05-08 DIAGNOSIS — Z79899 Other long term (current) drug therapy: Secondary | ICD-10-CM | POA: Diagnosis not present

## 2021-05-08 DIAGNOSIS — I25119 Atherosclerotic heart disease of native coronary artery with unspecified angina pectoris: Secondary | ICD-10-CM | POA: Diagnosis not present

## 2021-05-08 DIAGNOSIS — I259 Chronic ischemic heart disease, unspecified: Secondary | ICD-10-CM | POA: Diagnosis not present

## 2021-05-08 LAB — GLUCOSE, CAPILLARY
Glucose-Capillary: 190 mg/dL — ABNORMAL HIGH (ref 70–99)
Glucose-Capillary: 201 mg/dL — ABNORMAL HIGH (ref 70–99)

## 2021-05-08 LAB — NM MYOCAR MULTI W/SPECT W/WALL MOTION / EF
LV dias vol: 139 mL (ref 62–150)
LV sys vol: 93 mL
Nuc Stress EF: 33 %
Peak HR: 85 {beats}/min
RATE: 0.4
Rest HR: 67 {beats}/min
Rest Nuclear Isotope Dose: 9.8 mCi
SDS: 3
SRS: 2
SSS: 5
ST Depression (mm): 0 mm
Stress Nuclear Isotope Dose: 30 mCi
TID: 1.31

## 2021-05-08 LAB — BASIC METABOLIC PANEL
Anion gap: 7 (ref 5–15)
BUN: 12 mg/dL (ref 8–23)
CO2: 28 mmol/L (ref 22–32)
Calcium: 8.1 mg/dL — ABNORMAL LOW (ref 8.9–10.3)
Chloride: 103 mmol/L (ref 98–111)
Creatinine, Ser: 0.76 mg/dL (ref 0.61–1.24)
GFR, Estimated: >60 mL/min (ref >60)
Glucose, Bld: 187 mg/dL — ABNORMAL HIGH (ref 70–99)
Potassium: 4.2 mmol/L (ref 3.5–5.1)
Sodium: 138 mmol/L (ref 135–145)

## 2021-05-08 LAB — BRAIN NATRIURETIC PEPTIDE: B Natriuretic Peptide: 183 pg/mL — ABNORMAL HIGH (ref 0.0–100.0)

## 2021-05-08 LAB — ECHOCARDIOGRAM COMPLETE
AR max vel: 1.87 cm2
AV Area VTI: 1.67 cm2
AV Area mean vel: 1.48 cm2
AV Mean grad: 6 mmHg
AV Peak grad: 9.7 mmHg
Ao pk vel: 1.56 m/s
Height: 68 in
P 1/2 time: 323 msec
S' Lateral: 4 cm
Weight: 3289.6 oz

## 2021-05-08 LAB — CBC
HCT: 38 % — ABNORMAL LOW (ref 39.0–52.0)
Hemoglobin: 12.3 g/dL — ABNORMAL LOW (ref 13.0–17.0)
MCH: 31.5 pg (ref 26.0–34.0)
MCHC: 32.4 g/dL (ref 30.0–36.0)
MCV: 97.2 fL (ref 80.0–100.0)
Platelets: 136 10*3/uL — ABNORMAL LOW (ref 150–400)
RBC: 3.91 MIL/uL — ABNORMAL LOW (ref 4.22–5.81)
RDW: 14.7 % (ref 11.5–15.5)
WBC: 6.2 10*3/uL (ref 4.0–10.5)
nRBC: 0 % (ref 0.0–0.2)

## 2021-05-08 LAB — APTT: aPTT: 49 seconds — ABNORMAL HIGH (ref 24–36)

## 2021-05-08 LAB — HEPARIN LEVEL (UNFRACTIONATED): Heparin Unfractionated: >1.1 IU/mL — ABNORMAL HIGH (ref 0.30–0.70)

## 2021-05-08 MED ORDER — SODIUM CHLORIDE FLUSH 0.9 % IV SOLN
INTRAVENOUS | Status: AC
  Administered 2021-05-08: 10 mL via INTRAVENOUS
  Filled 2021-05-08: qty 10

## 2021-05-08 MED ORDER — FUROSEMIDE 20 MG PO TABS: 20.0000 mg | ORAL_TABLET | Freq: Every day | ORAL | 0 refills | Status: DC | PRN

## 2021-05-08 MED ORDER — ISOSORBIDE MONONITRATE ER 30 MG PO TB24: 45.0000 mg | ORAL_TABLET | Freq: Every day | ORAL | 0 refills | Status: DC

## 2021-05-08 MED ORDER — TECHNETIUM TC 99M TETROFOSMIN IV KIT
30.0000 | PACK | Freq: Once | INTRAVENOUS | Status: AC | PRN
  Administered 2021-05-08: 30 via INTRAVENOUS

## 2021-05-08 MED ORDER — HEPARIN BOLUS VIA INFUSION
2000.0000 [IU] | Freq: Once | INTRAVENOUS | Status: AC
  Administered 2021-05-08: 2000 [IU] via INTRAVENOUS
  Filled 2021-05-08: qty 2000

## 2021-05-08 MED ORDER — ATORVASTATIN CALCIUM 80 MG PO TABS: 80.0000 mg | ORAL_TABLET | Freq: Every evening | ORAL | 0 refills | Status: DC

## 2021-05-08 MED ORDER — TECHNETIUM TC 99M TETROFOSMIN IV KIT
10.0000 | PACK | Freq: Once | INTRAVENOUS | Status: AC | PRN
  Administered 2021-05-08: 9.8 via INTRAVENOUS

## 2021-05-08 MED ORDER — ISOSORBIDE MONONITRATE ER 30 MG PO TB24: 30.0000 mg | ORAL_TABLET | Freq: Every day | ORAL | 0 refills | Status: DC

## 2021-05-08 MED ORDER — APIXABAN 5 MG PO TABS: 5.0000 mg | ORAL_TABLET | Freq: Two times a day (BID) | ORAL | Status: DC

## 2021-05-08 MED ORDER — FUROSEMIDE 10 MG/ML IJ SOLN
20.0000 mg | Freq: Once | INTRAMUSCULAR | Status: AC
  Administered 2021-05-08: 20 mg via INTRAVENOUS
  Filled 2021-05-08: qty 2

## 2021-05-08 MED ORDER — PERFLUTREN LIPID MICROSPHERE
1.0000 mL | INTRAVENOUS | Status: AC | PRN
  Administered 2021-05-08: 2 mL via INTRAVENOUS
  Filled 2021-05-08: qty 10

## 2021-05-08 MED ORDER — METOPROLOL SUCCINATE ER 50 MG PO TB24: 50.0000 mg | ORAL_TABLET | Freq: Every day | ORAL | 0 refills | Status: DC

## 2021-05-08 MED ORDER — FUROSEMIDE 20 MG PO TABS: 20.0000 mg | ORAL_TABLET | Freq: Every day | ORAL | Status: DC | PRN

## 2021-05-08 MED ORDER — REGADENOSON 0.4 MG/5ML IV SOLN
INTRAVENOUS | Status: AC
  Administered 2021-05-08: 0.4 mg via INTRAVENOUS
  Filled 2021-05-08: qty 5

## 2021-05-08 NOTE — Discharge Summary (Signed)
Physician Discharge Summary   Patient: Richard [name] MRN: [id number] DOB: 1934-01-09  Admit date:     05/07/2021  Discharge date: 05/08/21  Discharge Physician: Deatra Larry   PCP: [facility]   Recommendations at discharge:  Follow-up with cardiologist in 2-4 weeks: Current cardiology recommendation for medications are: Medical therapy with Atorva 80, diltiazem 240 mg , imdur45 mg , toprol 50 mg  (no ASA on eliquis) - start lasix 20mg  as needed for shortness of breath, lower extremity edema  Discharge Diagnoses: Principal Problem:   Chest pain Active Problems:   Hyperlipidemia   Hypertensive heart disease   CAD (coronary artery disease)   DM (diabetes mellitus) ([place])   A-fib ([place])  Resolved Problems:   * No resolved hospital problems. [facility] Course: Richard [name] is an 88 year old male with extensive history of HTN/HLD, DM 2/  A-fib - on Eliquis, previous MIs, extensive history of coronary artery disease with RCA occlusion presented with acute onset of chest pain.  Patient reporting this chest pain started this morning while having breakfast, describes the pain as a pressure type pain with radiation into his right arm, not associate with shortness of breath nausea vomiting or Was not diaphoretic.  He is concerned since his level of activity intolerance is low at baseline.  Shortness of breath with minimal exertion.   ED: Blood pressure 117/81, pulse (!) 105, temperature 98 F (36.7 C), temperature source Oral, resp. rate (!) 22, height 5\' 8"  (1.727 m), weight 96.2 kg, SpO2 97 %. -Troponin 9 -LDL 61, HDL 34, CBC, CMP within normal limits -EGD A-fib with diffuse T wave abnormalities  Assessment and Plan: * Chest pain -Denies any further chest pain or shortness of breath  -Troponins stay flat x2 -Was on heparin drip overnight -Was n.p.o. overnight for cardiac stress test this a.m.   -Cardiology: Status post myocardial perfusion  stress test  -Initiating aspirin, high-dose statins, PR nitroglycerin -As needed morphine   -Extensive history of coronary artery disease with complete occlusion of right coronary-positive bilateral of the right Confirmed by last cardiac cath 2018 by Dr. Angelena Form   -Echo LVEF 50%, no WMAs  Status post endomyocardial perfusion stress test: - nuclear stress: prior inferior infarct with very mild peri-infarct ischemia consistent with known RCA CTO. LVEF by nuclear 33% howver by echo today 50%   - overall no evidence of new obstructive disease. Perhaps symptoms related to his chronic RCA CTO - medical therapy with atorvastatin 80, is a 240, imdur increased from 30 to 45 mg daily, toprol 50 (no ASA on eliquis for afib).   - some LE edema at times, start lasix 20mg  prn  Hyperlipidemia - Continue statin increase Lipitor from 10 to 80 mg daily  Hypertensive heart disease - Currently stable, resuming home medication of Imdur, losartan, metoprolol, also on Cardizem -Imdur increased from 30 to 45 mg daily -As needed Lasix 20 mg, for lower extremity edema  A-fib ([facility]) - Continuing home medication including Cardizem, Eliquis, metoprolol   DM (diabetes mellitus) ([place]) Resume home medication of metformin -Strict diabetic diet  CAD (coronary artery disease) - Extensive history of coronary artery disease -History of inferior MI with RCA occlusion Last cath 2013 revealed left main stenosis 40%, 40% stenosis in diagonal occluded RCA -Continue current home medication including Eliquis  Including statins, beta-blockers, ACEi -Evaluating for acute MI  NM myocardial fusion test 05/08/2021 -  echo LVEF 50%, no WMAs - nuclear stress: prior inferior infarct  with very mild peri-infarct ischemia consistent with known RCA CTO. LVEF by nuclear 33% howver by echo today 50%  - overall no evidence of new obstructive disease. Perhaps symptoms related to his chronic RCA CTO -Optimize medical  therapy      Consultants: Cardiology Dr. Harl Bowie Procedures performed: NM cardiac perfusion stress test and echocardiogram Disposition: Home Diet recommendation:  Discharge Diet Orders (From admission, onward)     Start     Ordered   05/08/21 0000  Diet - low sodium heart healthy        05/08/21 1455           Carb modified diet  DISCHARGE MEDICATION: Allergies as of 05/08/2021       Reactions   Niaspan [niacin Er]    flushing        Medication List     TAKE these medications    acetaminophen 500 MG tablet Commonly known as: TYLENOL Take 1,000 mg every 6 (six) hours as needed by mouth for mild pain or headache.   atorvastatin 80 MG tablet Commonly known as: LIPITOR Take 1 tablet (80 mg total) by mouth every evening. What changed:  medication strength how much to take   diltiazem 240 MG 24 hr capsule Commonly known as: CARDIZEM CD Take 1 capsule (240 mg total) by mouth daily.   Eliquis 5 MG Tabs tablet Generic drug: apixaban TAKE 1 TABLET BY MOUTH TWICE DAILY What changed: how much to take   furosemide 20 MG tablet Commonly known as: LASIX Take 1 tablet (20 mg total) by mouth daily as needed for edema or fluid. If gained weight greater than > 5 pounds in 24 hours   gabapentin 300 MG capsule Commonly known as: NEURONTIN Take 300 mg by mouth 2 (two) times daily.   isosorbide mononitrate 30 MG 24 hr tablet Commonly known as: IMDUR Take 1.5 tablets (45 mg total) by mouth daily. Start taking on: May 09, 2021 What changed:  how much to take when to take this   losartan 50 MG tablet Commonly known as: COZAAR Take 1 tablet (50 mg total) by mouth daily. What changed: how much to take   metFORMIN 500 MG 24 hr tablet Commonly known as: GLUCOPHAGE-XR Take 500 mg by mouth 2 (two) times daily.   metoprolol succinate 50 MG 24 hr tablet Commonly known as: TOPROL-XL Take 1 tablet (50 mg total) by mouth daily. Take with or immediately following a  meal. Start taking on: May 09, 2021 What changed: See the new instructions.   nitroGLYCERIN 0.4 MG SL tablet Commonly known as: NITROSTAT DISSOLVE 1 TABLET UNDER THE TONGUE EVERY 5 MINUTES AS NEEDED FOR CHEST PAIN. DO NOT EXCEED A TOTAL OF 3 DOSES IN 15 MINUTES. What changed:  how much to take how to take this when to take this reasons to take this additional instructions   terbinafine 250 MG tablet Commonly known as: LAMISIL Take 250 mg by mouth daily.         Discharge Exam: Filed Weights   05/07/21 1122 05/07/21 1845  Weight: 96.2 kg 93.3 kg      Physical Exam:   General:  Alert, oriented, cooperative, no distress;   HEENT:  Normocephalic, PERRL, otherwise with in Normal limits   Neuro:  CNII-XII intact. , normal motor and sensation, reflexes intact   Lungs:   Clear to auscultation BL, Respirations unlabored, no wheezes / crackles  Cardio:    S1/S2, RRR, No murmure, No Rubs or Gallops  Abdomen:   Soft, non-tender, bowel sounds active all four quadrants,  no guarding or peritoneal signs.  Muscular skeletal:  Limited exam - in bed, able to move all 4 extremities, Normal strength,  2+ pulses,  symmetric, No pitting edema  Skin:  Dry, warm to touch, negative for any Rashes,  Wounds: Please see nursing documentation          Condition at discharge: good  The results of significant diagnostics from this hospitalization (including imaging, microbiology, ancillary and laboratory) are listed below for reference.   Imaging Studies: DG Chest 2 View  Result Date: 05/07/2021 CLINICAL DATA:  Chest pain. EXAM: CHEST - 2 VIEW COMPARISON:  Same day. FINDINGS: Stable cardiomediastinal silhouette. Both lungs are clear. The visualized skeletal structures are unremarkable. IMPRESSION: No active cardiopulmonary disease. Electronically Signed   By: Marijo Conception M.D.   On: 05/07/2021 15:27   NM Myocar Multi W/Spect W/Wall Motion / EF  Result Date: 05/08/2021   Findings are  consistent with prior inferior myocardial infarction with very mild peri-infarct ischemia. The study is high risk based on decreased LVEF, there is very mild current ischemia. Recomend correlating LVEF with echo. May be technical issue, grossly LVEF looks better than reported.   No ST deviation was noted.   Left ventricular function is normal. Nuclear stress EF: 33 %. The left ventricular ejection fraction is moderately decreased (30-44%). End diastolic cavity size is normal. Slightly elevated TID could suggest a component of balanced ischemia.   DG Chest Portable 1 View  Result Date: 05/07/2021 CLINICAL DATA:  Chest pain EXAM: PORTABLE CHEST 1 VIEW COMPARISON:  None. FINDINGS: Low lung volumes. Mildly enlarged cardiac silhouette. No effusion, infiltrate or pneumothorax. IMPRESSION: Low lung volumes.  No acute cardiopulmonary process. Electronically Signed   By: Suzy Bouchard M.D.   On: 05/07/2021 11:47   ECHOCARDIOGRAM COMPLETE  Result Date: 05/08/2021    ECHOCARDIOGRAM REPORT   Patient Name:   Richard [name] Date of Exam: 05/08/2021 Medical Rec #:  [id number]           Height:       68.0 in Accession #:    [id number]          Weight:       205.6 lb Date of Birth:  18-Dec-1933           BSA:          2.068 m Patient Age:    34 years            BP:           127/82 mmHg Patient Gender: M                   HR:           74 bpm. Exam Location:  Inpatient Procedure: 2D Echo, Cardiac Doppler, Color Doppler and Intracardiac            Opacification Agent Indications:    Chest pain  History:        Patient has prior history of Echocardiogram examinations, most                 recent 11/26/2015. CAD, Arrythmias:Atrial Fibrillation; Risk                 Factors:Former Smoker, Hypertension and Dyslipidemia.  Sonographer:    Clayton Lefort RDCS (AE) Referring Phys: [id number] [place]  1. Left ventricular ejection fraction, by estimation, is 50%.  The left ventricle has low normal function. The left  ventricle has no regional wall motion abnormalities. There is mild left ventricular hypertrophy. Left ventricular diastolic parameters are  indeterminate.  2. Right ventricular systolic function is low normal. The right ventricular size is moderately enlarged. There is mildly elevated pulmonary artery systolic pressure.  3. Left atrial size was moderately dilated.  4. The mitral valve is abnormal. Moderate mitral valve regurgitation. No evidence of mitral stenosis.  5. The tricuspid valve is abnormal. Tricuspid valve regurgitation is mild to moderate.  6. The aortic valve is tricuspid. There is moderate calcification of the aortic valve. There is moderate thickening of the aortic valve. Aortic valve regurgitation is mild to moderate.  7. There is mild dilatation of the aortic root, measuring 42 mm.  8. The inferior vena cava is normal in size with greater than 50% respiratory variability, suggesting right atrial pressure of 3 mmHg. FINDINGS  Left Ventricle: Left ventricular ejection fraction, by estimation, is 50%. The left ventricle has low normal function. The left ventricle has no regional wall motion abnormalities. Definity contrast agent was given IV to delineate the left ventricular endocardial borders. The left ventricular internal cavity size was normal in size. There is mild left ventricular hypertrophy. Left ventricular diastolic parameters are indeterminate. Right Ventricle: The right ventricular size is moderately enlarged. Right vetricular wall thickness was not well visualized. Right ventricular systolic function is low normal. There is mildly elevated pulmonary artery systolic pressure. The tricuspid regurgitant velocity is 2.88 m/s, and with an assumed right atrial pressure of 3 mmHg, the estimated right ventricular systolic pressure is 38.1 mmHg. Left Atrium: Left atrial size was moderately dilated. Right Atrium: Right atrial size was not well visualized. Pericardium: There is no evidence of  pericardial effusion. Mitral Valve: The mitral valve is abnormal. Moderate mitral valve regurgitation. No evidence of mitral valve stenosis. Tricuspid Valve: The tricuspid valve is abnormal. Tricuspid valve regurgitation is mild to moderate. No evidence of tricuspid stenosis. Aortic Valve: The aortic valve is tricuspid. There is moderate calcification of the aortic valve. There is moderate thickening of the aortic valve. There is moderate aortic valve annular calcification. Aortic valve regurgitation is mild to moderate. Aortic regurgitation PHT measures 323 msec. Aortic valve mean gradient measures 6.0 mmHg. Aortic valve peak gradient measures 9.7 mmHg. Aortic valve area, by VTI measures 1.67 cm. Pulmonic Valve: The pulmonic valve was not well visualized. Pulmonic valve regurgitation is not visualized. No evidence of pulmonic stenosis. Aorta: The aortic root is normal in size and structure. There is mild dilatation of the aortic root, measuring 42 mm. Venous: The inferior vena cava is normal in size with greater than 50% respiratory variability, suggesting right atrial pressure of 3 mmHg. IAS/Shunts: The interatrial septum was not well visualized.  LEFT VENTRICLE PLAX 2D LVIDd:         5.00 cm LVIDs:         4.00 cm LV PW:         1.10 cm LV IVS:        1.10 cm LVOT diam:     2.20 cm LV SV:         50 LV SV Index:   24 LVOT Area:     3.80 cm  RIGHT VENTRICLE RV Basal diam:  3.60 cm RV Mid diam:    3.70 cm RV S prime:     10.00 cm/s TAPSE (M-mode): 1.5 cm LEFT ATRIUM  Index        RIGHT ATRIUM           Index LA diam:        4.40 cm 2.13 cm/m   RA Area:     22.10 cm LA Vol (A2C):   85.9 ml 41.54 ml/m  RA Volume:   61.40 ml  29.69 ml/m LA Vol (A4C):   91.7 ml 44.35 ml/m LA Biplane Vol: 96.3 ml 46.57 ml/m  AORTIC VALVE AV Area (Vmax):    1.87 cm AV Area (Vmean):   1.48 cm AV Area (VTI):     1.67 cm AV Vmax:           156.00 cm/s AV Vmean:          119.000 cm/s AV VTI:            0.299 m AV Peak  Grad:      9.7 mmHg AV Mean Grad:      6.0 mmHg LVOT Vmax:         76.70 cm/s LVOT Vmean:        46.300 cm/s LVOT VTI:          0.131 m LVOT/AV VTI ratio: 0.44 AI PHT:            323 msec  AORTA Ao Root diam: 4.20 cm TRICUSPID VALVE TR Peak grad:   33.2 mmHg TR Vmax:        288.00 cm/s  SHUNTS Systemic VTI:  0.13 m Systemic Diam: 2.20 cm Carlyle Dolly MD Electronically signed by Carlyle Dolly MD Signature Date/Time: 05/08/2021/12:25:02 PM    Final     Microbiology: Results for orders placed or performed during the hospital encounter of 05/07/21  Resp Panel by RT-PCR (Flu A&B, Covid) Nasopharyngeal Swab     Status: None   Collection Time: 05/07/21  4:59 PM   Specimen: Nasopharyngeal Swab; Nasopharyngeal(NP) swabs in vial transport medium  Result Value Ref Range Status   SARS Coronavirus 2 by RT PCR NEGATIVE NEGATIVE Final    Comment: (NOTE) SARS-CoV-2 target nucleic acids are NOT DETECTED.  The SARS-CoV-2 RNA is generally detectable in upper respiratory specimens during the acute phase of infection. The lowest concentration of SARS-CoV-2 viral copies this assay can detect is 138 copies/mL. A negative result does not preclude SARS-Cov-2 infection and should not be used as the sole basis for treatment or other patient management decisions. A negative result may occur with  improper specimen collection/handling, submission of specimen other than nasopharyngeal swab, presence of viral mutation(s) within the areas targeted by this assay, and inadequate number of viral copies(<138 copies/mL). A negative result must be combined with clinical observations, patient history, and epidemiological information. The expected result is Negative.  Fact Sheet for Patients:  EntrepreneurPulse.com.au  Fact Sheet for Healthcare Providers:  IncredibleEmployment.be  This test is no t yet approved or cleared by the [place] FDA and  has been authorized for detection  and/or diagnosis of SARS-CoV-2 by FDA under an Emergency Use Authorization (EUA). This EUA will remain  in effect (meaning this test can be used) for the duration of the COVID-19 declaration under Section 564(b)(1) of the Act, 21 U.S.C.section 360bbb-3(b)(1), unless the authorization is terminated  or revoked sooner.       Influenza A by PCR NEGATIVE NEGATIVE Final   Influenza B by PCR NEGATIVE NEGATIVE Final    Comment: (NOTE) The Xpert Xpress SARS-CoV-2/FLU/RSV plus assay is intended as an aid in the diagnosis of influenza  from Nasopharyngeal swab specimens and should not be used as a sole basis for treatment. Nasal washings and aspirates are unacceptable for Xpert Xpress SARS-CoV-2/FLU/RSV testing.  Fact Sheet for Patients: EntrepreneurPulse.com.au  Fact Sheet for Healthcare Providers: IncredibleEmployment.be  This test is not yet approved or cleared by the [place] FDA and has been authorized for detection and/or diagnosis of SARS-CoV-2 by FDA under an Emergency Use Authorization (EUA). This EUA will remain in effect (meaning this test can be used) for the duration of the COVID-19 declaration under Section 564(b)(1) of the Act, 21 U.S.C. section 360bbb-3(b)(1), unless the authorization is terminated or revoked.  Performed at [facility], [address], [place],  [address]     Labs: CBC: Recent Labs  Lab 05/07/21 1220 05/08/21 0602  WBC 7.9 6.2  HGB 12.9* 12.3*  HCT 39.9 38.0*  MCV 96.4 97.2  PLT 159 314*   Basic Metabolic Panel: Recent Labs  Lab 05/07/21 1220 05/08/21 0602  NA 137 138  K 3.9 4.2  CL 104 103  CO2 24 28  GLUCOSE 184* 187*  BUN 14 12  CREATININE 0.83 0.76  CALCIUM 8.3* 8.1*   Liver Function Tests: No results for input(s): AST, ALT, ALKPHOS, BILITOT, PROT, ALBUMIN in the last 168 hours. CBG: Recent Labs  Lab 05/07/21 1738 05/07/21 2116 05/08/21 0726 05/08/21 1106  GLUCAP 126* 195*  190* 201*    Discharge time spent: greater than 30 minutes.  Signed: Deatra Da, MD [facility] 05/08/2021

## 2021-05-08 NOTE — Assessment & Plan Note (Addendum)
Resume home medication of metformin ?-Strict diabetic diet ?

## 2021-05-08 NOTE — Care Management CC44 (Signed)
Condition Code 44 Documentation Completed ? ?Patient Details  ?Name: Richard [name] ?MRN: [id number] ?Date of Birth: Sep 02, 1933 ? ? ?Condition Code 44 given:  Yes ?Patient signature on Condition Code 44 notice:  Yes ?Documentation of 2 MD's agreement:  Yes ?Code 44 added to claim:  Yes ? ? ? ?Iona Beard, LCSWA ?05/08/2021, 3:41 PM ? ?

## 2021-05-08 NOTE — Progress Notes (Signed)
?  Transition of Care (TOC) Screening Note ? ? ?Patient Details  ?Name: Richard [name] ?Date of Birth: Jul 09, 1933 ? ? ?Transition of Care (TOC) CM/SW Contact:    ?Iona Beard, LCSWA ?Phone Number: ?05/08/2021, 1:59 PM ? ? ? ?Transition of Care Department [facility]) has reviewed patient and no TOC needs have been identified at this time. We will continue to monitor patient advancement through interdisciplinary progression rounds. If new patient transition needs arise, please place a TOC consult. ?  ?

## 2021-05-08 NOTE — Progress Notes (Signed)
PROGRESS NOTE    Patient: Richard [name]                            PCP: [facility]                    DOB: February 16, 1934            DOA: 05/07/2021 [id number]             DOS: 05/08/2021, 12:06 PM   LOS: 1 day   Date of Service: The patient was seen and examined on 05/08/2021  Subjective:   The patient was seen and examined this morning. Hemodynamically stable this morning, denies any chest pain or shortness of breath. N.p.o. for NM myocardial perfusion stress test  Brief Narrative:   Richard [name] is an 88 year old male with extensive history of HTN/HLD, DM 2/  A-fib - on Eliquis, previous MIs, extensive history of coronary artery disease with RCA occlusion presented with acute onset of chest pain.  Patient reporting this chest pain started this morning while having breakfast, describes the pain as a pressure type pain with radiation into his right arm, not associate with shortness of breath nausea vomiting or Was not diaphoretic.  He is concerned since his level of activity intolerance is low at baseline.  Shortness of breath with minimal exertion.   ED: Blood pressure 117/81, pulse (!) 105, temperature 98 F (36.7 C), temperature source Oral, resp. rate (!) 22, height 5\' 8"  (1.727 m), weight 96.2 kg, SpO2 97 %. -Troponin 9 -LDL 61, HDL 34, CBC, CMP within normal limits -EGD A-fib with diffuse T wave abnormalities    Assessment & Plan:   Principal Problem:   Chest pain Active Problems:   Hyperlipidemia   Hypertensive heart disease   CAD (coronary artery disease)   DM (diabetes mellitus) (HCC)   A-fib (HCC)     Assessment and Plan: * Chest pain -Denies any further chest pain or shortness of breath - On telemetry floor closely monitor no events overnight -Troponins stay flat x2 -Was on heparin drip overnight -N.p.o. for nuclear myocardial perfusion stress test this morning  -Repeat EKG as needed -Cardiology following  closely  -Initiating aspirin, high-dose statins, PR nitroglycerin -As needed morphine -Extensive history of coronary artery disease with complete occlusion of right coronary-positive bilateral of the right Confirmed by last cardiac cath 2018 by Dr. Angelena Form  -Pending NM myocardial perfusion stress test results -2D echocardiogram  Hyperlipidemia - Continue statin  Hypertensive heart disease - Currently stable, resuming home medication of Imdur, losartan, metoprolol, also on Cardizem -As needed IV hydralazine  A-fib (HCC) - Continuing home medication including Cardizem, Eliquis, metoprolol -Monitoring closely -Eliquis on hold patient has been on heparin drip overnight  DM (diabetes mellitus) (HCC) - Holding metformin -Currently n.p.o. -hemoglobin A1c -We will monitor CBG QA CHS with SSI coverage  CAD (coronary artery disease) - Extensive history of coronary artery disease -History of inferior MI with RCA occlusion Last cath 2013 revealed left main stenosis 40%, 40% stenosis in diagonal occluded RCA -Continue current home medication including Eliquis  Including statins, beta-blockers, ACEi -Evaluating for acute MI -NM myocardial fusion test 05/08/2021 -    DVT prophylaxis:  SCDs Start: 05/07/21 1456   Code Status:   Code Status: Full Code  Family Communication: Discussed with his brother and wife at bedside The above findings and plan of care has been discussed with patient (and  family)  in detail,  they expressed understanding and agreement of above. -Advance care planning has been discussed.   Admission status:   Status is: Inpatient Remains inpatient appropriate because: Further cardiac evaluation including stress test  and cardiac echocardiogram     Procedures:   -NM myocardial perfusion stress test >>>    Antimicrobials:  Anti-infectives (From admission, onward)    None        Medication:   atorvastatin  80 mg Oral QPM   diltiazem  240 mg Oral  Daily   gabapentin  300 mg Oral BID   insulin aspart  0-6 Units Subcutaneous TID WC   isosorbide mononitrate  30 mg Oral Daily   metoprolol succinate  50 mg Oral Daily    acetaminophen, nitroGLYCERIN, ondansetron (ZOFRAN) IV, perflutren lipid microspheres (DEFINITY) IV suspension   Objective:   Vitals:   05/07/21 1845 05/07/21 2114 05/08/21 0133 05/08/21 0400  BP:  119/79  127/82  Pulse:  (!) 46  75  Resp:    18  Temp:  (!) 97.5 F (36.4 C)    TempSrc:  Oral    SpO2:  94% 95% 98%  Weight: 93.3 kg     Height:        Intake/Output Summary (Last 24 hours) at 05/08/2021 1206 Last data filed at 05/08/2021 0700 Gross per 24 hour  Intake 1038.7 ml  Output --  Net 1038.7 ml   Filed Weights   05/07/21 1122 05/07/21 1845  Weight: 96.2 kg 93.3 kg     Examination:   Physical Exam  Constitution:  Alert, cooperative, no distress,  Appears calm and comfortable  Psychiatric:   Normal and stable mood and affect, cognition intact,   HEENT:        Normocephalic, PERRL, otherwise with in Normal limits  Chest:         Chest symmetric Cardio vascular:  S1/S2, RRR, No murmure, No Rubs or Gallops  pulmonary: Clear to auscultation bilaterally, respirations unlabored, negative wheezes / crackles Abdomen: Soft, non-tender, non-distended, bowel sounds,no masses, no organomegaly Muscular skeletal: Limited exam - in bed, able to move all 4 extremities,   Neuro: CNII-XII intact. , normal motor and sensation, reflexes intact  Extremities: No pitting edema lower extremities, +2 pulses  Skin: Dry, warm to touch, negative for any Rashes, No open wounds Wounds: per nursing documentation   ------------------------------------------------------------------------------------------------------------------------------------------    LABs:  CBC Latest Ref Rng & Units 05/08/2021 05/07/2021 11/19/2020  WBC 4.0 - 10.5 K/uL 6.2 7.9 7.3  Hemoglobin 13.0 - 17.0 g/dL 12.3(L) 12.9(L) 14.1  Hematocrit 39.0 - 52.0  % 38.0(L) 39.9 44.5  Platelets 150 - 400 K/uL 136(L) 159 148(L)   CMP Latest Ref Rng & Units 05/08/2021 05/07/2021 11/19/2020  Glucose 70 - 99 mg/dL 187(H) 184(H) 206(H)  BUN 8 - 23 mg/dL 12 14 15   Creatinine 0.61 - 1.24 mg/dL 0.76 0.83 0.81  Sodium 135 - 145 mmol/L 138 137 139  Potassium 3.5 - 5.1 mmol/L 4.2 3.9 4.0  Chloride 98 - 111 mmol/L 103 104 100  CO2 22 - 32 mmol/L 28 24 27   Calcium 8.9 - 10.3 mg/dL 8.1(L) 8.3(L) 8.8(L)  Total Protein 6.0 - 8.3 g/dL - - -  Total Bilirubin 0.3 - 1.2 mg/dL - - -  Alkaline Phos 39 - 117 U/L - - -  AST 0 - 37 U/L - - -  ALT 0 - 53 U/L - - -       Micro Results Recent Results (  from the past 240 hour(s))  Resp Panel by RT-PCR (Flu A&B, Covid) Nasopharyngeal Swab     Status: None   Collection Time: 05/07/21  4:59 PM   Specimen: Nasopharyngeal Swab; Nasopharyngeal(NP) swabs in vial transport medium  Result Value Ref Range Status   SARS Coronavirus 2 by RT PCR NEGATIVE NEGATIVE Final    Comment: (NOTE) SARS-CoV-2 target nucleic acids are NOT DETECTED.  The SARS-CoV-2 RNA is generally detectable in upper respiratory specimens during the acute phase of infection. The lowest concentration of SARS-CoV-2 viral copies this assay can detect is 138 copies/mL. A negative result does not preclude SARS-Cov-2 infection and should not be used as the sole basis for treatment or other patient management decisions. A negative result may occur with  improper specimen collection/handling, submission of specimen other than nasopharyngeal swab, presence of viral mutation(s) within the areas targeted by this assay, and inadequate number of viral copies(<138 copies/mL). A negative result must be combined with clinical observations, patient history, and epidemiological information. The expected result is Negative.  Fact Sheet for Patients:  EntrepreneurPulse.com.au  Fact Sheet for Healthcare Providers:   IncredibleEmployment.be  This test is no t yet approved or cleared by the [place] FDA and  has been authorized for detection and/or diagnosis of SARS-CoV-2 by FDA under an Emergency Use Authorization (EUA). This EUA will remain  in effect (meaning this test can be used) for the duration of the COVID-19 declaration under Section 564(b)(1) of the Act, 21 U.S.C.section 360bbb-3(b)(1), unless the authorization is terminated  or revoked sooner.       Influenza A by PCR NEGATIVE NEGATIVE Final   Influenza B by PCR NEGATIVE NEGATIVE Final    Comment: (NOTE) The Xpert Xpress SARS-CoV-2/FLU/RSV plus assay is intended as an aid in the diagnosis of influenza from Nasopharyngeal swab specimens and should not be used as a sole basis for treatment. Nasal washings and aspirates are unacceptable for Xpert Xpress SARS-CoV-2/FLU/RSV testing.  Fact Sheet for Patients: EntrepreneurPulse.com.au  Fact Sheet for Healthcare Providers: IncredibleEmployment.be  This test is not yet approved or cleared by the [place] FDA and has been authorized for detection and/or diagnosis of SARS-CoV-2 by FDA under an Emergency Use Authorization (EUA). This EUA will remain in effect (meaning this test can be used) for the duration of the COVID-19 declaration under Section 564(b)(1) of the Act, 21 U.S.C. section 360bbb-3(b)(1), unless the authorization is terminated or revoked.  Performed at [facility], [address], [place], [place] [address]     Radiology Reports DG Chest 2 View  Result Date: 05/07/2021 CLINICAL DATA:  Chest pain. EXAM: CHEST - 2 VIEW COMPARISON:  Same day. FINDINGS: Stable cardiomediastinal silhouette. Both lungs are clear. The visualized skeletal structures are unremarkable. IMPRESSION: No active cardiopulmonary disease. Electronically Signed   By: Marijo Conception M.D.   On: 05/07/2021 15:27   NM Myocar Multi W/Spect W/Wall  Motion / EF  Result Date: 05/08/2021   Findings are consistent with prior inferior myocardial infarction with very mild peri-infarct ischemia. The study is high risk based on decreased LVEF, there is very mild current ischemia. Recomend correlating LVEF with echo. May be technical issue, grossly LVEF looks better than reported.   No ST deviation was noted.   Left ventricular function is normal. Nuclear stress EF: 33 %. The left ventricular ejection fraction is moderately decreased (30-44%). End diastolic cavity size is normal. Slightly elevated TID could suggest a component of balanced ischemia.    SIGNED: Valeria Batman  Hoorain Kozakiewicz, MD, FHM. [facility],  Pager (please use [email or web address] to page/text) Please use Epic Secure Chat for non-urgent communication (7AM-7PM)  If 7PM-7AM, please contact night-coverage [email or web address], 05/08/2021, 12:06 PM

## 2021-05-08 NOTE — Progress Notes (Signed)
? ?  Richard [name] presented for a nuclear stress test today.  No immediate complications.  Stress imaging is pending at this time. ? ?Preliminary EKG findings may be listed in the chart, but the stress test result will not be finalized until perfusion imaging is complete. ? ?1 day study, Dr Harl Bowie to read. ? ?Rosaria Ferries, PA-C ?05/08/2021, 10:39 AM  ? ?

## 2021-05-08 NOTE — Assessment & Plan Note (Addendum)
-   Currently stable, resuming home medication of Imdur, losartan, metoprolol, also on Cardizem ?-Imdur increased from 30 to 45 mg daily ?-As needed Lasix 20 mg, for lower extremity edema ?

## 2021-05-08 NOTE — Progress Notes (Addendum)
? ?Progress Note ? ?Patient Name: Richard [name] ?Date of Encounter: 05/08/2021 ? ?[facility] Cardiologist: Rozann Lesches, MD  ? ?Subjective  ? ?Breathing better today, no CP overnight.  ? ?Inpatient Medications  ?  ?Scheduled Meds: ? regadenoson      ? sodium chloride flush      ? atorvastatin  80 mg Oral QPM  ? diltiazem  240 mg Oral Daily  ? furosemide  20 mg Intravenous Once  ? gabapentin  300 mg Oral BID  ? heparin  2,000 Units Intravenous Once  ? insulin aspart  0-6 Units Subcutaneous TID WC  ? isosorbide mononitrate  30 mg Oral Daily  ? metoprolol succinate  50 mg Oral Daily  ? ?Continuous Infusions: ? sodium chloride 50 mL/hr at 05/08/21 0830  ? heparin 1,000 Units/hr (05/08/21 0313)  ? ?PRN Meds: ?acetaminophen, nitroGLYCERIN, ondansetron (ZOFRAN) IV, technetium tetrofosmin  ? ?Vital Signs  ?  ?Vitals:  ? 05/07/21 1845 05/07/21 2114 05/08/21 0133 05/08/21 0400  ?BP:  119/79  127/82  ?Pulse:  (!) 46  75  ?Resp:    18  ?Temp:  (!) 97.5 ?F (36.4 ?C)    ?TempSrc:  Oral    ?SpO2:  94% 95% 98%  ?Weight: 93.3 kg     ?Height:      ? ? ?Intake/Output Summary (Last 24 hours) at 05/08/2021 0857 ?Last data filed at 05/08/2021 0700 ?Gross per 24 hour  ?Intake 1038.7 ml  ?Output --  ?Net 1038.7 ml  ? ?Last 3 Weights 05/07/2021 05/07/2021 11/25/2020  ?Weight (lbs) 205 lb 9.6 oz 212 lb 207 lb  ?Weight (kg) 93.26 kg 96.163 kg 93.895 kg  ?   ? ?Telemetry  ?  ?Atrial fib, rate OK, PVCs - Personally Reviewed ? ?ECG  ?  ?03/03 ECG is atrial fib, HR 78, PVC - Personally Reviewed ? ?Physical Exam  ? ?GEN: No acute distress.   ?Neck: No JVD ?Cardiac: Irreg R&R, no murmurs, rubs, or gallops.  ?Respiratory: decreased BS bases but Clear to auscultation bilaterally. ?GI: Soft, nontender, non-distended  ?MS: No edema; No deformity. ?Neuro:  Nonfocal  ?Psych: Normal affect  ? ?Labs  ?  ?High Sensitivity Troponin:   ?Recent Labs  ?Lab 05/07/21 ?1220 05/07/21 ?1429 05/07/21 ?1524 05/07/21 ?1720  ?TROPONINIHS 9 11 12 13   ?    ?Chemistry ?Recent Labs  ?Lab 05/07/21 ?1220 05/08/21 ?0602  ?NA 137 138  ?K 3.9 4.2  ?CL 104 103  ?CO2 24 28  ?GLUCOSE 184* 187*  ?BUN 14 12  ?CREATININE 0.83 0.76  ?CALCIUM 8.3* 8.1*  ?GFRNONAA >60 >60  ?ANIONGAP 9 7  ?  ?Lipids  ?Recent Labs  ?Lab 05/07/21 ?1501  ?CHOL 115  ?TRIG 100  ?HDL 34*  ?[place] 61  ?CHOLHDL 3.4  ?  ?Hematology ?Recent Labs  ?Lab 05/07/21 ?1220 05/08/21 ?0602  ?WBC 7.9 6.2  ?RBC 4.14* 3.91*  ?HGB 12.9* 12.3*  ?HCT 39.9 38.0*  ?MCV 96.4 97.2  ?MCH 31.2 31.5  ?MCHC 32.3 32.4  ?RDW 14.6 14.7  ?PLT 159 136*  ? ?Thyroid No results for input(s): TSH, FREET4 in the last 168 hours.  ?BNP ?Recent Labs  ?Lab 05/08/21 ?0602  ?BNP 183.0*  ?  ?DDimer No results for input(s): DDIMER in the last 168 hours.  ? ?Lab Results  ?Component Value Date  ? CHOL 115 05/07/2021  ? HDL 34 (L) 05/07/2021  ? [place] 61 05/07/2021  ? TRIG 100 05/07/2021  ? CHOLHDL 3.4 05/07/2021  ? ? ? ?  Radiology  ?  ?DG Chest 2 View ? ?Result Date: 05/07/2021 ?CLINICAL DATA:  Chest pain. EXAM: CHEST - 2 VIEW COMPARISON:  Same day. FINDINGS: Stable cardiomediastinal silhouette. Both lungs are clear. The visualized skeletal structures are unremarkable. IMPRESSION: No active cardiopulmonary disease. Electronically Signed   By: Marijo Conception M.D.   On: 05/07/2021 15:27  ? ?DG Chest Portable 1 View ? ?Result Date: 05/07/2021 ?CLINICAL DATA:  Chest pain EXAM: PORTABLE CHEST 1 VIEW COMPARISON:  None. FINDINGS: Low lung volumes. Mildly enlarged cardiac silhouette. No effusion, infiltrate or pneumothorax. IMPRESSION: Low lung volumes.  No acute cardiopulmonary process. Electronically Signed   By: Suzy Bouchard M.D.   On: 05/07/2021 11:47   ? ?Cardiac Studies  ? ?MYOVIEW: pending ? ?CARDIAC CATH: 2018 ?Prox RCA lesion is 100% stenosed. ?Ost 1st Mrg to 1st Mrg lesion is 60% stenosed. ?Ost Cx to Prox Cx lesion is 20% stenosed. ?Ost 2nd Mrg lesion is 50% stenosed. ?Prox LAD lesion is 30% stenosed. ?Ost 1st Diag to 1st Diag lesion is 40%  stenosed. ?Ost LM lesion is 20% stenosed. ?  ?1. Chronic occlusion RCA with brisk collateral filling from left to right collaterals. This is known from cath in 2013.  ?2. Mild non-obstructive plaque in the left main and LAD. The second obtuse marginal Shikara Mcauliffe has a moderate stenosis that does not appear to be flow limiting.  ?3. I did not cross the aortic valve due to radial artery spasm at the end of the case.  ?  ?Recommendations: Continue medical management of CAD.  ?Diagnostic ?Dominance: Right ? ? ? ?Patient Profile  ?   ?88 y.o. male  with HTN with DM, CAD s/p LHC in 2018, permanent atrial fibrillation, was admitted 03/02 with chest pain. ? ?Assessment & Plan  ?  ?Chest pain ?- ez neg MI ?- ECG no new changes ?- known RCA 100% ?- MV pending ?- LDL is at goal ?- BP is at goal ? ?2. Perm Afib ?- HR occ slow but not sustained ?- cont Cardizem ?- Eliquis held for possible cath, restart when able ? ?3. Nocturnal hypoxia ?- CXR NAD ?- no hx OSA ?- continue night time O2 ? ?Otherwise,per IM ? ?For questions or updates, please contact [place] ?Please consult [email or web address] for contact info under  ? ?  ?   ?Signed, ?Rosaria Ferries, PA-C  ?05/08/2021, 8:57 AM   ? ?Attending note ?Patient seen and discussed with PA Barrett, I agree with her documentation.  ? ? ?1.CAD/Chest pain ?- Cath 2018 RCA CTO with collaterals. LM 20%, prox LAD 30%, D1 40%, osital LCX 20%, OM1 60%, OM2 50%.  ?- presented with chest pain ?- trops neg, EKG afib chronic inferior ST/T changes ?- echo LVEF 50%, no WMAs ?- nuclear stress: prior inferior infarct with very mild peri-infarct ischemia consistent with known RCA CTO. LVEF by nuclear 33% howver by echo today 50% ? ?- overall no evidence of new obstructive disease. Perhaps symptoms related to his chronic RCA CTO ?- medical therapy with atorva 80, dilt 240, imdur 30, toprol 50 (no ASA on eliquis for afib).  ?- increase imdur to 45 mg daily.  ?- some LE edema at times, start lasix 20mg  prn ? ?[facility]  for discharge, we will arrange f/u. We will signoff inpatient care. ? ? ?Carlyle Dolly MD ?

## 2021-05-08 NOTE — Assessment & Plan Note (Addendum)
-  Denies any further chest pain or shortness of breath ? ?-Troponins stay flat x2 ?-Was on heparin drip overnight ?-Was n.p.o. overnight for cardiac stress test this a.m. ? ? ?-Cardiology: ?Status post myocardial perfusion stress test ? ?-Initiating aspirin, high-dose statins, PR nitroglycerin ?-As needed morphine ? ? ?-Extensive history of coronary artery disease with complete occlusion of right coronary-positive bilateral of the right ?Confirmed by last cardiac cath 2018 by Dr. Angelena Form ? ? ?-Echo LVEF 50%, no WMAs ? ?Status post endomyocardial perfusion stress test: ?- nuclear stress: prior inferior infarct with very mild peri-infarct ischemia consistent with known RCA CTO. LVEF by nuclear 33% howver by echo today 50% ?? ?- overall no evidence of new obstructive disease. Perhaps symptoms related to his chronic RCA CTO ?- medical therapy with atorvastatin 80, is a 240, imdur increased from 30 to 45 mg daily, toprol 50 (no ASA on eliquis for afib).  ? ?- some LE edema at times, start lasix 20mg  prn ?

## 2021-05-08 NOTE — Progress Notes (Signed)
Dr. Orlin Hilding notified patient O2 sats keep dropping into mid 80s while asleep. His sats will come back up into the 90s when awake. He was placed on 2L O2 while sleeping. Sats are currently 95%. Will continue to monitor.  ?

## 2021-05-08 NOTE — Progress Notes (Addendum)
ANTICOAGULATION CONSULT NOTE - Follow Up Consult ? ?Pharmacy Consult for Heparin ?Indication: chest pain/ACS ? ?Patient Measurements: ?Heparin dosing weight: 88.7 ? ?Labs: ?Recent Labs  ?  05/07/21 ?1220 05/07/21 ?1429 05/07/21 ?1524 05/07/21 ?1720 05/08/21 ?0602  ?HGB 12.9*  --   --   --  12.3*  ?HCT 39.9  --   --   --  38.0*  ?PLT 159  --   --   --  136*  ?APTT  --   --   --   --  49*  ?HEPARINUNFRC  --   --   --   --  >1.10*  ?CREATININE 0.83  --   --   --  0.76  ?TROPONINIHS 9 11 12 13   --   ? ? ?Estimated Creatinine Clearance: 74.9 mL/min (by C-G formula based on SCr of 0.76 mg/dL). ? ? ?Medications:  ?Infusions:  ? sodium chloride 100 mL/hr at 05/08/21 0328  ? heparin 1,000 Units/hr (05/08/21 0313)  ? ? ?Assessment: ?88 y/o male hx Afib on apixaban PTA, admitted for CP/ACS. aPTT currently subtherapeutic. HL falsely elevated due to DOAC residual effects. ? ?Goal of Therapy:  ?aPTT 66-102 seconds (HL 0.3-0.7) ?Monitor platelets by anticoagulation protocol: Yes ?  ?Plan:  ?Heparin bolus 2000 ?Increase rate to 1250 units/hr = 12.5 ml/hr ?Recheck aPTT in 8 hours ? ?Lorenso Courier, PharmD ?Clinical Pharmacist ?05/08/2021 8:22 AM ? ? ? ? ? ? ? ?

## 2021-05-08 NOTE — Assessment & Plan Note (Addendum)
-   Continuing home medication including Cardizem, Eliquis, metoprolol ? ?

## 2021-05-08 NOTE — Care Management Important Message (Signed)
Important Message ? ?Patient Details  ?Name: Richard [name] ?MRN: [id number] ?Date of Birth: July 04, 1933 ? ? ?Medicare Important Message Given:  N/A - LOS <3 / Initial given by admissions ? ? ? ? ?Tommy Medal ?05/08/2021, 3:11 PM ?

## 2021-05-08 NOTE — Assessment & Plan Note (Addendum)
-   Extensive history of coronary artery disease ?-History of inferior MI with RCA occlusion ?Last cath 2013 revealed left main stenosis 40%, 40% stenosis in diagonal occluded RCA ?-Continue current home medication including Eliquis  ?Including statins, beta-blockers, ACEi ?-Evaluating for acute MI ? ?NM myocardial fusion test 05/08/2021 -  ?echo LVEF 50%, no WMAs ?- nuclear stress: prior inferior infarct with very mild peri-infarct ischemia consistent with known RCA CTO. LVEF by nuclear 33% howver by echo today 50% ??- overall no evidence of new obstructive disease. Perhaps symptoms related to his chronic RCA CTO ?-Optimize medical therapy ?

## 2021-05-08 NOTE — Care Management Obs Status (Signed)
MEDICARE OBSERVATION STATUS NOTIFICATION ? ? ?Patient Details  ?Name: Richard [name] ?MRN: [id number] ?Date of Birth: [birth year] ? ? ?Medicare Observation Status Notification Given:  Yes ? ? ? ?Iona Beard, LCSWA ?05/08/2021, 3:41 PM ?

## 2021-05-08 NOTE — Assessment & Plan Note (Addendum)
-   Continue statin increase Lipitor from 10 to 80 mg daily ?

## 2021-05-11 ENCOUNTER — Telehealth: Payer: Self-pay

## 2021-05-11 NOTE — Telephone Encounter (Signed)
Patient contacted regarding discharge from  ?[facility] on 05/08/21. ? ?Patient understands to follow up with provider Domenic Polite on 05/12/21 at 220 pm at [facility] at [facility]. ?Patient understands discharge instructions?  ? ?Patient understands medications and regiment?  ? ?Patient understands to bring all medications to this visit?  ? ?Ask patient:  Are you enrolled in My Chart (yes or no)  If no ask patient if they would like to enroll.  ? ? ?Left message to return call. ? ?

## 2021-05-12 ENCOUNTER — Other Ambulatory Visit: Payer: Self-pay

## 2021-05-12 ENCOUNTER — Encounter: Payer: Self-pay | Admitting: Cardiology

## 2021-05-12 ENCOUNTER — Ambulatory Visit (INDEPENDENT_AMBULATORY_CARE_PROVIDER_SITE_OTHER): Payer: Medicare Other | Admitting: Cardiology

## 2021-05-12 VITALS — BP 108/66 | HR 68 | Ht 68.0 in | Wt 187.0 lb

## 2021-05-12 DIAGNOSIS — I200 Unstable angina: Secondary | ICD-10-CM | POA: Diagnosis not present

## 2021-05-12 DIAGNOSIS — I4821 Permanent atrial fibrillation: Secondary | ICD-10-CM | POA: Diagnosis not present

## 2021-05-12 DIAGNOSIS — I25119 Atherosclerotic heart disease of native coronary artery with unspecified angina pectoris: Secondary | ICD-10-CM | POA: Diagnosis not present

## 2021-05-12 NOTE — Telephone Encounter (Signed)
Patient seen in office today. 

## 2021-05-12 NOTE — Patient Instructions (Signed)
Medication Instructions:  Your physician recommends that you continue on your current medications as directed. Please refer to the Current Medication list given to you today.   Labwork: None today  Testing/Procedures: None today  Follow-Up: 6 months  Any Other Special Instructions Will Be Listed Below (If Applicable).  If you need a refill on your cardiac medications before your next appointment, please call your pharmacy.  

## 2021-05-12 NOTE — Progress Notes (Signed)
? ? ?Cardiology Office Note ? ?Date: 05/12/2021  ? ?ID: Richard [name], DOB 07-22-1933, MRN [id number] ? ?PCP:  [facility]  ?Cardiologist:  Rozann Lesches, MD ?Electrophysiologist:  None  ? ?Chief Complaint  ?Patient presents with  ? Cardiac follow-up  ? ? ?History of Present Illness: ?Richard [name] is an 88 y.o. male last seen in September 2022.  He presents for a hospital follow-up visit.  He was recently seen in consultation by Dr. Harl Bowie for evaluation of chest pain, ruled out for ACS by cardiac enzymes.  Follow-up echocardiogram and Myoview results are noted below.  Medical therapy was pursued. ? ?He reports no further angina at this time, we went over his medications which are noted below.  He states that he has kept his nitroglycerin supply fresh.  Back to his regular activities. ? ?Past Medical History:  ?Diagnosis Date  ? CAD (coronary artery disease)   ? Mild-moderate LM with occluded RCA (collaterals) - managed medically 2013  ? GERD (gastroesophageal reflux disease)   ? Hypertensive heart disease   ? Obesity (BMI 30.0-34.9)   ? Old inferior myocardial infarction 1995  ? PAF (paroxysmal atrial fibrillation) ([place])   ? Pulmonary embolus ([place])   ? [name] 2015  ? ? ?Past Surgical History:  ?Procedure Laterality Date  ? ACHILLES TENDON REPAIR    ? APPENDECTOMY    ? LEFT HEART CATHETERIZATION WITH CORONARY ANGIOGRAM N/A 01/24/2012  ? Procedure: LEFT HEART CATHETERIZATION WITH CORONARY ANGIOGRAM;  Surgeon: Jacolyn Reedy, MD;  Location: [facility] CATH LAB;  Service: Cardiovascular;  Laterality: N/A;  ? SHOULDER SURGERY    ? ? ?Current Outpatient Medications  ?Medication Sig Dispense Refill  ? acetaminophen (TYLENOL) 500 MG tablet Take 1,000 mg every 6 (six) hours as needed by mouth for mild pain or headache.    ? atorvastatin (LIPITOR) 80 MG tablet Take 1 tablet (80 mg total) by mouth every evening. 30 tablet 0  ? diltiazem (CARDIZEM CD) 240 MG 24 hr capsule Take 1 capsule (240 mg total) by  mouth daily. 90 capsule 3  ? ELIQUIS 5 MG TABS tablet TAKE 1 TABLET BY MOUTH TWICE DAILY (Patient taking differently: Take 5 mg by mouth 2 (two) times daily.) 60 tablet 6  ? furosemide (LASIX) 20 MG tablet Take 1 tablet (20 mg total) by mouth daily as needed for edema or fluid. If gained weight greater than > 5 pounds in 24 hours 30 tablet 0  ? gabapentin (NEURONTIN) 300 MG capsule Take 300 mg by mouth 2 (two) times daily.  6  ? isosorbide mononitrate (IMDUR) 30 MG 24 hr tablet Take 1.5 tablets (45 mg total) by mouth daily. 45 tablet 0  ? losartan (COZAAR) 50 MG tablet Take 1 tablet (50 mg total) by mouth daily. 90 tablet 3  ? metFORMIN (GLUCOPHAGE-XR) 500 MG 24 hr tablet Take 500 mg by mouth 2 (two) times daily.     ? metoprolol succinate (TOPROL-XL) 50 MG 24 hr tablet Take 1 tablet (50 mg total) by mouth daily. Take with or immediately following a meal. 30 tablet 0  ? nitroGLYCERIN (NITROSTAT) 0.4 MG SL tablet DISSOLVE 1 TABLET UNDER THE TONGUE EVERY 5 MINUTES AS NEEDED FOR CHEST PAIN. DO NOT EXCEED A TOTAL OF 3 DOSES IN 15 MINUTES. (Patient taking differently: Place 0.4 mg under the tongue every 5 (five) minutes as needed for chest pain.) 25 tablet 2  ? ?No current facility-administered medications for this visit.  ? ?Allergies:  Niaspan [niacin  er]  ? ?ROS: No palpitations or syncope. ? ?Physical Exam: ?VS:  BP 108/66   Pulse 68   Ht '5\' 8"'$  (1.727 m)   Wt 187 lb (84.8 kg)   SpO2 95%   BMI 28.43 kg/m? , BMI Body mass index is 28.43 kg/m?. ? ?Wt Readings from Last 3 Encounters:  ?05/12/21 187 lb (84.8 kg)  ?05/07/21 205 lb 9.6 oz (93.3 kg)  ?11/25/20 207 lb (93.9 kg)  ?  ?General: Patient appears comfortable at rest. ?HEENT: Conjunctiva and lids normal, wearing a mask. ?Neck: Supple, no elevated JVP or carotid bruits, no thyromegaly. ?Lungs: Clear to auscultation, nonlabored breathing at rest. ?Cardiac: Regular rate and rhythm, no S3, 1/6 systolic murmur, no pericardial rub. ?Extremities: No pitting  edema. ? ?ECG:  An ECG dated 05/07/2021 was personally reviewed today and demonstrated:  Atrial fibrillation at 81 bpm with RSR' in lead V1 and V2 and nonspecific ST changes . ? ?Recent Labwork: ?05/08/2021: B Natriuretic Peptide 183.0; BUN 12; Creatinine, Ser 0.76; Hemoglobin 12.3; Platelets 136; Potassium 4.2; Sodium 138  ?   ?Component Value Date/Time  ? CHOL 115 05/07/2021 1501  ? TRIG 100 05/07/2021 1501  ? HDL 34 (L) 05/07/2021 1501  ? CHOLHDL 3.4 05/07/2021 1501  ? VLDL 20 05/07/2021 1501  ? LDLCALC 61 05/07/2021 1501  ? ? ?Other Studies Reviewed Today: ? ?Echocardiogram 05/08/2021: ? 1. Left ventricular ejection fraction, by estimation, is 50%. The left  ?ventricle has low normal function. The left ventricle has no regional wall  ?motion abnormalities. There is mild left ventricular hypertrophy. Left  ?ventricular diastolic parameters are  ? indeterminate.  ? 2. Right ventricular systolic function is low normal. The right  ?ventricular size is moderately enlarged. There is mildly elevated  ?pulmonary artery systolic pressure.  ? 3. Left atrial size was moderately dilated.  ? 4. The mitral valve is abnormal. Moderate mitral valve regurgitation. No  ?evidence of mitral stenosis.  ? 5. The tricuspid valve is abnormal. Tricuspid valve regurgitation is mild  ?to moderate.  ? 6. The aortic valve is tricuspid. There is moderate calcification of the  ?aortic valve. There is moderate thickening of the aortic valve. Aortic  ?valve regurgitation is mild to moderate.  ? 7. There is mild dilatation of the aortic root, measuring 42 mm.  ? 8. The inferior vena cava is normal in size with greater than 50%  ?respiratory variability, suggesting right atrial pressure of 3 mmHg.  ? ?Lexiscan Myoview 05/08/2021: ?  Findings are consistent with prior inferior myocardial infarction with very mild peri-infarct ischemia. The study is high risk based on decreased LVEF, there is very mild current ischemia. Recomend correlating LVEF with echo.  May be technical issue, grossly LVEF looks better than reported. ?  No ST deviation was noted. ?  Left ventricular function is normal. Nuclear stress EF: 33 %. The left ventricular ejection fraction is moderately decreased (30-44%). End diastolic cavity size is normal. Slightly elevated TID could suggest a component of balanced ischemia. ? ?Assessment and Plan: ? ?1.  Multivessel CAD with mild to moderate left system disease and occluded RCA with left-to-right collaterals.  Recent hospitalization reviewed, he ruled out for ACS and plan will be to continue medical therapy based on his follow-up Lexiscan Myoview and echocardiogram noted above.  He does not report any progressive angina at this time.  Continue Toprol-XL, Imdur, losartan, and Lipitor.  He is not on aspirin given use of Eliquis. ? ?2.  Permanent atrial fibrillation with  CHA2DS2-VASc score of 3.  He continues on Cardizem CD and Toprol-XL for heart rate control as well as Eliquis for stroke prophylaxis.  I reviewed his recent lab work.  No changes to current regimen. ? ?Medication Adjustments/Labs and Tests Ordered: ?Current medicines are reviewed at length with the patient today.  Concerns regarding medicines are outlined above.  ? ?Tests Ordered: ?No orders of the defined types were placed in this encounter. ? ? ?Medication Changes: ?No orders of the defined types were placed in this encounter. ? ? ?Disposition:  Follow up  6 months. ? ?Signed, ?Satira Sark, MD, [facility] ?05/12/2021 2:57 PM    ?[place] [facility] ?[address], [place], [place] [address] ?Phone: [phone]; Fax: [phone]  ?

## 2021-05-25 DIAGNOSIS — H353122 Nonexudative age-related macular degeneration, left eye, intermediate dry stage: Secondary | ICD-10-CM | POA: Diagnosis not present

## 2021-05-27 ENCOUNTER — Ambulatory Visit: Payer: Medicare Other | Admitting: Cardiology

## 2021-05-30 ENCOUNTER — Other Ambulatory Visit: Payer: Self-pay | Admitting: Cardiology

## 2021-06-10 ENCOUNTER — Other Ambulatory Visit: Payer: Self-pay | Admitting: *Deleted

## 2021-06-10 MED ORDER — ATORVASTATIN CALCIUM 80 MG PO TABS: 80.0000 mg | ORAL_TABLET | Freq: Every evening | ORAL | 2 refills | Status: DC

## 2021-06-25 ENCOUNTER — Ambulatory Visit: Payer: Medicare Other | Admitting: Cardiology

## 2021-07-03 ENCOUNTER — Telehealth: Payer: Self-pay | Admitting: Cardiology

## 2021-07-03 NOTE — Telephone Encounter (Signed)
New Message: ? ? ? ? ?Kimilee needs the correct directions for Metoprolol. Is it 1 tablet or 2 tablets daily. ?

## 2021-07-03 NOTE — Telephone Encounter (Signed)
Per chart- Sig: Take 1 tablet (50 mg total) by mouth daily. Take with or immediately following a meal. ? ? ?Medication was not changed by provider per last OV. ? ?Pharmacist notified and verbalized understanding.  ?

## 2021-07-14 ENCOUNTER — Other Ambulatory Visit: Payer: Self-pay | Admitting: *Deleted

## 2021-07-14 MED ORDER — ISOSORBIDE MONONITRATE ER 30 MG PO TB24: 45.0000 mg | ORAL_TABLET | Freq: Every day | ORAL | 2 refills | Status: DC

## 2021-07-20 DIAGNOSIS — M1990 Unspecified osteoarthritis, unspecified site: Secondary | ICD-10-CM | POA: Diagnosis not present

## 2021-07-20 DIAGNOSIS — M79672 Pain in left foot: Secondary | ICD-10-CM | POA: Diagnosis not present

## 2021-07-20 DIAGNOSIS — M25579 Pain in unspecified ankle and joints of unspecified foot: Secondary | ICD-10-CM | POA: Diagnosis not present

## 2021-07-20 DIAGNOSIS — M79671 Pain in right foot: Secondary | ICD-10-CM | POA: Diagnosis not present

## 2021-07-30 DIAGNOSIS — Z6831 Body mass index (BMI) 31.0-31.9, adult: Secondary | ICD-10-CM | POA: Diagnosis not present

## 2021-07-30 DIAGNOSIS — E1165 Type 2 diabetes mellitus with hyperglycemia: Secondary | ICD-10-CM | POA: Diagnosis not present

## 2021-07-30 DIAGNOSIS — G629 Polyneuropathy, unspecified: Secondary | ICD-10-CM | POA: Diagnosis not present

## 2021-07-30 DIAGNOSIS — Z23 Encounter for immunization: Secondary | ICD-10-CM | POA: Diagnosis not present

## 2021-07-30 DIAGNOSIS — Z0001 Encounter for general adult medical examination with abnormal findings: Secondary | ICD-10-CM | POA: Diagnosis not present

## 2021-07-30 DIAGNOSIS — J019 Acute sinusitis, unspecified: Secondary | ICD-10-CM | POA: Diagnosis not present

## 2021-07-30 DIAGNOSIS — I482 Chronic atrial fibrillation, unspecified: Secondary | ICD-10-CM | POA: Diagnosis not present

## 2021-07-30 DIAGNOSIS — I10 Essential (primary) hypertension: Secondary | ICD-10-CM | POA: Diagnosis not present

## 2021-08-17 DIAGNOSIS — M79671 Pain in right foot: Secondary | ICD-10-CM | POA: Diagnosis not present

## 2021-08-17 DIAGNOSIS — M25579 Pain in unspecified ankle and joints of unspecified foot: Secondary | ICD-10-CM | POA: Diagnosis not present

## 2021-08-17 DIAGNOSIS — M79674 Pain in right toe(s): Secondary | ICD-10-CM | POA: Diagnosis not present

## 2021-08-17 DIAGNOSIS — M79675 Pain in left toe(s): Secondary | ICD-10-CM | POA: Diagnosis not present

## 2021-08-17 DIAGNOSIS — M79672 Pain in left foot: Secondary | ICD-10-CM | POA: Diagnosis not present

## 2021-08-17 DIAGNOSIS — E1140 Type 2 diabetes mellitus with diabetic neuropathy, unspecified: Secondary | ICD-10-CM | POA: Diagnosis not present

## 2021-08-17 DIAGNOSIS — I739 Peripheral vascular disease, unspecified: Secondary | ICD-10-CM | POA: Diagnosis not present

## 2021-08-17 DIAGNOSIS — M792 Neuralgia and neuritis, unspecified: Secondary | ICD-10-CM | POA: Diagnosis not present

## 2021-09-29 DIAGNOSIS — I4891 Unspecified atrial fibrillation: Secondary | ICD-10-CM | POA: Diagnosis not present

## 2021-09-29 DIAGNOSIS — R42 Dizziness and giddiness: Secondary | ICD-10-CM | POA: Diagnosis not present

## 2021-09-29 DIAGNOSIS — R519 Headache, unspecified: Secondary | ICD-10-CM | POA: Diagnosis not present

## 2021-09-29 DIAGNOSIS — R030 Elevated blood-pressure reading, without diagnosis of hypertension: Secondary | ICD-10-CM | POA: Diagnosis not present

## 2021-09-29 DIAGNOSIS — R202 Paresthesia of skin: Secondary | ICD-10-CM | POA: Diagnosis not present

## 2021-09-29 DIAGNOSIS — Z6829 Body mass index (BMI) 29.0-29.9, adult: Secondary | ICD-10-CM | POA: Diagnosis not present

## 2021-10-07 DIAGNOSIS — M6281 Muscle weakness (generalized): Secondary | ICD-10-CM | POA: Diagnosis not present

## 2021-10-07 DIAGNOSIS — R202 Paresthesia of skin: Secondary | ICD-10-CM | POA: Diagnosis not present

## 2021-10-07 DIAGNOSIS — M542 Cervicalgia: Secondary | ICD-10-CM | POA: Diagnosis not present

## 2021-10-13 DIAGNOSIS — R519 Headache, unspecified: Secondary | ICD-10-CM | POA: Diagnosis not present

## 2021-10-13 DIAGNOSIS — R202 Paresthesia of skin: Secondary | ICD-10-CM | POA: Diagnosis not present

## 2021-10-13 DIAGNOSIS — M542 Cervicalgia: Secondary | ICD-10-CM | POA: Diagnosis not present

## 2021-10-13 DIAGNOSIS — M6281 Muscle weakness (generalized): Secondary | ICD-10-CM | POA: Diagnosis not present

## 2021-10-15 ENCOUNTER — Ambulatory Visit (INDEPENDENT_AMBULATORY_CARE_PROVIDER_SITE_OTHER): Payer: Medicare Other

## 2021-10-15 ENCOUNTER — Encounter: Payer: Self-pay | Admitting: Orthopaedic Surgery

## 2021-10-15 ENCOUNTER — Ambulatory Visit (INDEPENDENT_AMBULATORY_CARE_PROVIDER_SITE_OTHER): Payer: Medicare Other | Admitting: Orthopaedic Surgery

## 2021-10-15 VITALS — Ht 68.0 in | Wt 189.0 lb

## 2021-10-15 DIAGNOSIS — G8929 Other chronic pain: Secondary | ICD-10-CM

## 2021-10-15 DIAGNOSIS — M25551 Pain in right hip: Secondary | ICD-10-CM

## 2021-10-15 DIAGNOSIS — M545 Low back pain, unspecified: Secondary | ICD-10-CM | POA: Diagnosis not present

## 2021-10-15 DIAGNOSIS — M7061 Trochanteric bursitis, right hip: Secondary | ICD-10-CM | POA: Diagnosis not present

## 2021-10-15 DIAGNOSIS — I200 Unstable angina: Secondary | ICD-10-CM | POA: Diagnosis not present

## 2021-10-15 DIAGNOSIS — M5136 Other intervertebral disc degeneration, lumbar region: Secondary | ICD-10-CM | POA: Diagnosis not present

## 2021-10-15 MED ORDER — BUPIVACAINE HCL 0.25 % IJ SOLN
2.0000 mL | INTRAMUSCULAR | Status: AC | PRN
  Administered 2021-10-15: 2 mL via INTRA_ARTICULAR

## 2021-10-15 MED ORDER — LIDOCAINE HCL 1 % IJ SOLN
1.0000 mL | INTRAMUSCULAR | Status: AC | PRN
  Administered 2021-10-15: 1 mL

## 2021-10-15 MED ORDER — METHYLPREDNISOLONE ACETATE 40 MG/ML IJ SUSP
40.0000 mg | INTRAMUSCULAR | Status: AC | PRN
  Administered 2021-10-15: 40 mg via INTRA_ARTICULAR

## 2021-10-15 NOTE — Progress Notes (Signed)
Office Visit Note   Patient: Richard [name]           Date of Birth: 1934/03/05           MRN: [id number] Visit Date: 10/15/2021              Requested by: Practice, [place],  [place] [address] PCP: [facility]   Assessment & Plan: Visit Diagnoses:  1. Chronic right-sided low back pain, unspecified whether sciatica present   2. Pain in right hip   3. Trochanteric bursitis, right hip   4. Other intervertebral disc degeneration, lumbar region     Plan: Drug injection performed if he develops claudication symptoms or progressive symptoms he can call about scheduling a lumbar MRI scan.  Patient and wife reassured that he does not have any hip osteoarthritis at this point.  Follow-Up Instructions: No follow-ups on file.   Orders:  Orders Placed This Encounter  Procedures   XR HIP UNILAT W OR W/O PELVIS 2-3 VIEWS RIGHT   XR Lumbar Spine 2-3 Views   No orders of the defined types were placed in this encounter.     Procedures: Large Joint Inj on 10/15/2021 10:02 AM Details: 1.5 in lateral approach Medications: 1 mL lidocaine 1 %; 2 mL bupivacaine 0.25 %; 40 mg methylPREDNISolone acetate 40 MG/ML      Clinical Data: No additional findings.   Subjective: Chief Complaint  Patient presents with   Right Hip - Pain   Right Leg - Pain    HPI 88 year old male with A-fib on Eliquis coronary artery disease, hypertension is here with his wife and she states she has been trying to get him to see the doctor for over a year due to back pain in the right buttocks and in the right lateral hip.  Pain does not seem to radiate past his knee.  He sometimes feels a sharp pain and has trouble when he first gets up denies claudication symptoms but uses a cane when he goes out to pick veins in the garden.  He is bumped in the walls a few times when he has a sharp pain and has a cane but sometimes does not use it.  Denies fever or chills.  No previous  lumbar surgery.  Review of Systems all other systems noncontributory to HPI.   Objective: Vital Signs: Ht '5\' 8"'$  (1.727 m)   Wt 189 lb (85.7 kg)   BMI 28.74 kg/m   Physical Exam Constitutional:      Appearance: He is well-developed.  HENT:     Head: Normocephalic and atraumatic.     Right Ear: External ear normal.     Left Ear: External ear normal.  Eyes:     Pupils: Pupils are equal, round, and reactive to light.  Neck:     Thyroid: No thyromegaly.     Trachea: No tracheal deviation.  Cardiovascular:     Rate and Rhythm: Normal rate.  Pulmonary:     Effort: Pulmonary effort is normal.     Breath sounds: No wheezing.  Abdominal:     General: Bowel sounds are normal.     Palpations: Abdomen is soft.  Musculoskeletal:     Cervical back: Neck supple.  Skin:    General: Skin is warm and dry.     Capillary Refill: Capillary refill takes less than 2 seconds.  Neurological:     Mental Status: He is alert and oriented to person, place,  and time.  Psychiatric:        Behavior: Behavior normal.        Thought Content: Thought content normal.        Judgment: Judgment normal.     Ortho Exam patient is slow getting present and standing stands from a poor he can ambulate.  Mild left lumbar curvature tenderness over the sciatic notch on the right and moderate tenderness over the trochanteric bursa on the right.  Negative logroll of the hips knees reach full extension negative active compression test.  Ankle dorsiflexion plantarflexion is strong.  Specialty Comments:  No specialty comments available.  Imaging: No results found.   PMFS History: Patient Active Problem List   Diagnosis Date Noted   Trochanteric bursitis, right hip 10/15/2021   Other intervertebral disc degeneration, lumbar region 10/15/2021   Chest pain 05/07/2021   DM (diabetes mellitus) ([place]) 05/07/2021   A-fib ([place]) 05/07/2021   Unstable angina ([place]) 01/21/2012   Hypertensive heart disease    GERD  (gastroesophageal reflux disease)    Obesity (BMI 30.0-34.9)    CAD (coronary artery disease)    Old inferior myocardial infarction    Hyperlipidemia    Past Medical History:  Diagnosis Date   CAD (coronary artery disease)    Mild-moderate LM with occluded RCA (collaterals) - managed medically 2013   GERD (gastroesophageal reflux disease)    Hypertensive heart disease    Obesity (BMI 30.0-34.9)    Old inferior myocardial infarction 1995   PAF (paroxysmal atrial fibrillation) ([place])    Pulmonary embolus ([place])    [name] 2015    Family History  Problem Relation Age of Onset   Heart failure Mother     Past Surgical History:  Procedure Laterality Date   ACHILLES TENDON REPAIR     APPENDECTOMY     LEFT HEART CATHETERIZATION WITH CORONARY ANGIOGRAM N/A 01/24/2012   Procedure: LEFT HEART CATHETERIZATION WITH CORONARY ANGIOGRAM;  Surgeon: Jacolyn Reedy, MD;  Location: [facility] CATH LAB;  Service: Cardiovascular;  Laterality: N/A;   SHOULDER SURGERY     Social History   Occupational History   Not on file  Tobacco Use   Smoking status: Former    Packs/day: 0.25    Types: Cigarettes    Quit date: 03/08/1993    Years since quitting: 28.6   Smokeless tobacco: Never  Vaping Use   Vaping Use: Never used  Substance and Sexual Activity   Alcohol use: No   Drug use: No   Sexual activity: Not on file

## 2021-10-19 DIAGNOSIS — M6281 Muscle weakness (generalized): Secondary | ICD-10-CM | POA: Diagnosis not present

## 2021-10-19 DIAGNOSIS — M25531 Pain in right wrist: Secondary | ICD-10-CM | POA: Diagnosis not present

## 2021-10-19 DIAGNOSIS — E119 Type 2 diabetes mellitus without complications: Secondary | ICD-10-CM | POA: Diagnosis not present

## 2021-10-19 DIAGNOSIS — M542 Cervicalgia: Secondary | ICD-10-CM | POA: Diagnosis not present

## 2021-10-19 DIAGNOSIS — I4891 Unspecified atrial fibrillation: Secondary | ICD-10-CM | POA: Diagnosis not present

## 2021-10-19 DIAGNOSIS — R202 Paresthesia of skin: Secondary | ICD-10-CM | POA: Diagnosis not present

## 2021-10-19 DIAGNOSIS — Z6828 Body mass index (BMI) 28.0-28.9, adult: Secondary | ICD-10-CM | POA: Diagnosis not present

## 2021-10-19 DIAGNOSIS — R030 Elevated blood-pressure reading, without diagnosis of hypertension: Secondary | ICD-10-CM | POA: Diagnosis not present

## 2021-10-22 DIAGNOSIS — I4891 Unspecified atrial fibrillation: Secondary | ICD-10-CM | POA: Diagnosis not present

## 2021-10-22 DIAGNOSIS — R202 Paresthesia of skin: Secondary | ICD-10-CM | POA: Diagnosis not present

## 2021-10-22 DIAGNOSIS — R222 Localized swelling, mass and lump, trunk: Secondary | ICD-10-CM | POA: Diagnosis not present

## 2021-10-22 DIAGNOSIS — R918 Other nonspecific abnormal finding of lung field: Secondary | ICD-10-CM | POA: Diagnosis not present

## 2021-10-26 DIAGNOSIS — R202 Paresthesia of skin: Secondary | ICD-10-CM | POA: Diagnosis not present

## 2021-10-26 DIAGNOSIS — M6281 Muscle weakness (generalized): Secondary | ICD-10-CM | POA: Diagnosis not present

## 2021-10-26 DIAGNOSIS — M542 Cervicalgia: Secondary | ICD-10-CM | POA: Diagnosis not present

## 2021-10-30 DIAGNOSIS — I6782 Cerebral ischemia: Secondary | ICD-10-CM | POA: Diagnosis not present

## 2021-10-30 DIAGNOSIS — G319 Degenerative disease of nervous system, unspecified: Secondary | ICD-10-CM | POA: Diagnosis not present

## 2021-10-30 DIAGNOSIS — R519 Headache, unspecified: Secondary | ICD-10-CM | POA: Diagnosis not present

## 2021-11-02 DIAGNOSIS — M79671 Pain in right foot: Secondary | ICD-10-CM | POA: Diagnosis not present

## 2021-11-02 DIAGNOSIS — M79674 Pain in right toe(s): Secondary | ICD-10-CM | POA: Diagnosis not present

## 2021-11-02 DIAGNOSIS — M25579 Pain in unspecified ankle and joints of unspecified foot: Secondary | ICD-10-CM | POA: Diagnosis not present

## 2021-11-02 DIAGNOSIS — E1140 Type 2 diabetes mellitus with diabetic neuropathy, unspecified: Secondary | ICD-10-CM | POA: Diagnosis not present

## 2021-11-02 DIAGNOSIS — M79672 Pain in left foot: Secondary | ICD-10-CM | POA: Diagnosis not present

## 2021-11-02 DIAGNOSIS — M79675 Pain in left toe(s): Secondary | ICD-10-CM | POA: Diagnosis not present

## 2021-11-02 DIAGNOSIS — I739 Peripheral vascular disease, unspecified: Secondary | ICD-10-CM | POA: Diagnosis not present

## 2021-11-02 DIAGNOSIS — M792 Neuralgia and neuritis, unspecified: Secondary | ICD-10-CM | POA: Diagnosis not present

## 2021-11-03 DIAGNOSIS — R202 Paresthesia of skin: Secondary | ICD-10-CM | POA: Diagnosis not present

## 2021-11-03 DIAGNOSIS — M6281 Muscle weakness (generalized): Secondary | ICD-10-CM | POA: Diagnosis not present

## 2021-11-03 DIAGNOSIS — M542 Cervicalgia: Secondary | ICD-10-CM | POA: Diagnosis not present

## 2021-11-10 DIAGNOSIS — M6281 Muscle weakness (generalized): Secondary | ICD-10-CM | POA: Diagnosis not present

## 2021-11-10 DIAGNOSIS — M542 Cervicalgia: Secondary | ICD-10-CM | POA: Diagnosis not present

## 2021-11-10 DIAGNOSIS — R202 Paresthesia of skin: Secondary | ICD-10-CM | POA: Diagnosis not present

## 2021-11-26 DIAGNOSIS — H353132 Nonexudative age-related macular degeneration, bilateral, intermediate dry stage: Secondary | ICD-10-CM | POA: Diagnosis not present

## 2021-11-26 DIAGNOSIS — Z23 Encounter for immunization: Secondary | ICD-10-CM | POA: Diagnosis not present

## 2021-12-02 ENCOUNTER — Ambulatory Visit: Payer: Medicare Other | Attending: Cardiology | Admitting: Cardiology

## 2021-12-02 ENCOUNTER — Encounter: Payer: Self-pay | Admitting: Cardiology

## 2021-12-02 VITALS — BP 110/70 | HR 91 | Ht 68.0 in | Wt 183.0 lb

## 2021-12-02 DIAGNOSIS — I200 Unstable angina: Secondary | ICD-10-CM | POA: Diagnosis not present

## 2021-12-02 DIAGNOSIS — I25119 Atherosclerotic heart disease of native coronary artery with unspecified angina pectoris: Secondary | ICD-10-CM | POA: Diagnosis not present

## 2021-12-02 DIAGNOSIS — E782 Mixed hyperlipidemia: Secondary | ICD-10-CM | POA: Diagnosis not present

## 2021-12-02 DIAGNOSIS — I4821 Permanent atrial fibrillation: Secondary | ICD-10-CM | POA: Insufficient documentation

## 2021-12-02 NOTE — Progress Notes (Signed)
Cardiology Office Note  Date: 12/02/2021   ID: Richard [name], DOB [birth year], MRN [id number]  PCP:  [facility]  Cardiologist:  Rozann Lesches, MD Electrophysiologist:  None   Chief Complaint  Patient presents with   Cardiac follow-up    History of Present Illness: Richard [name] is an 88 y.o. male last seen in March.  He is here for a routine visit.  He does not report any angina or recent nitroglycerin use.  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  I reviewed his medications which are stable from a cardiac perspective.  He does not report any spontaneous bleeding problems on Eliquis.  I did review his lab work from earlier in the year.  Lipids have been well controlled on Lipitor, last LDL was 61.  Past Medical History:  Diagnosis Date   CAD (coronary artery disease)    Mild-moderate LM with occluded RCA (collaterals) - managed medically 2013   GERD (gastroesophageal reflux disease)    Hypertensive heart disease    Obesity (BMI 30.0-34.9)    Old inferior myocardial infarction 1995   PAF (paroxysmal atrial fibrillation) ([place])    Pulmonary embolus ([place])    [name] 2015    Past Surgical History:  Procedure Laterality Date   ACHILLES TENDON REPAIR     APPENDECTOMY     LEFT HEART CATHETERIZATION WITH CORONARY ANGIOGRAM N/A 01/24/2012   Procedure: LEFT HEART CATHETERIZATION WITH CORONARY ANGIOGRAM;  Surgeon: Jacolyn Reedy, MD;  Location[facility] C[facility] LAB;  Service: Cardiovascular;  Laterality: N/A;   SHOULDER SURGERY      Current Outpatient Medications  Medication Sig Dispense Refill   acetaminophen (TYLENOL) 500 MG tablet Take 1,000 mg every 6 (six) hours as needed by mouth for mild pain or headache.     atorvastatin (LIPITOR) 80 MG tablet Take 1 tablet (80 mg total) by mouth every evening. 90 tablet 2   dapagliflozin propanediol (FARXIGA) 10 MG  TABS tablet Take by mouth daily.     diclofenac Sodium (VOLTAREN) 1 % GEL SMARTSIG:0.5 Gram(s) Topical Twice Daily     diltiazem (CARDIZEM CD) 240 MG 24 hr capsule Take 1 capsule (240 mg total) by mouth daily. 90 capsule 3   ELIQUIS 5 MG TABS tablet TAKE 1 TABLET BY MOUTH TWICE DAILY (Patient taking differently: Take 5 mg by mouth 2 (two) times daily.) 60 tablet 6   furosemide (LASIX) 20 MG tablet Take 1 tablet (20 mg total) by mouth daily as needed for edema or fluid. If gained weight greater than > 5 pounds in 24 hours 30 tablet 0   gabapentin (NEURONTIN) 300 MG capsule Take 300 mg by mouth 2 (two) times daily.  6   isosorbide mononitrate (IMDUR) 30 MG 24 hr tablet Take 1.5 tablets (45 mg total) by mouth daily. 135 tablet 2   losartan (COZAAR) 50 MG tablet Take 1 tablet by mouth once daily 90 tablet 3   metFORMIN (GLUCOPHAGE-XR) 500 MG 24 hr tablet Take 500 mg by mouth 2 (two) times daily.      metoprolol succinate (TOPROL-XL) 50 MG 24 hr tablet Take 1 tablet (50 mg total) by mouth daily. Take with or immediately following a meal. 30 tablet 0   nitroGLYCERIN (NITROSTAT) 0.4 MG SL tablet DISSOLVE 1 TABLET UNDER THE TONGUE EVERY 5 MINUTES AS NEEDED FOR CHEST PAIN. DO NOT EXCEED A TOTAL OF 3 DOSES IN 15  MINUTES. (Patient taking differently: Place 0.4 mg under the tongue every 5 (five) minutes as needed for chest pain.) 25 tablet 2   No current facility-administered medications for this visit.   Allergies:  Niaspan [niacin er]   ROS: No palpitations.  No syncope.  Physical Exam: VS:  BP 110/70   Pulse 91   Ht '5\' 8"'$  (1.727 m)   Wt 183 lb (83 kg)   SpO2 97%   BMI 27.83 kg/m , BMI Body mass index is 27.83 kg/m.  Wt Readings from Last 3 Encounters:  12/02/21 183 lb (83 kg)  10/15/21 189 lb (85.7 kg)  05/12/21 187 lb (84.8 kg)    General: Patient appears comfortable at rest. HEENT: Conjunctiva and lids normal. Neck: Supple, no elevated JVP or carotid bruits. Lungs: Clear to auscultation,  nonlabored breathing at rest. Cardiac: Regular rate and rhythm, no S3, 1/6 systolic murmur. Extremities: No pitting edema.  ECG:  An ECG dated 05/07/2021 was personally reviewed today and demonstrated:  Atrial fibrillation with RSR' in lead V1 and V2, nonspecific ST changes.  Recent Labwork: 05/08/2021: B Natriuretic Peptide 183.0; BUN 12; Creatinine, Ser 0.76; Hemoglobin 12.3; Platelets 136; Potassium 4.2; Sodium 138     Component Value Date/Time   CHOL 115 05/07/2021 1501   TRIG 100 05/07/2021 1501   HDL 34 (L) 05/07/2021 1501   CHOLHDL 3.4 05/07/2021 1501   VLDL 20 05/07/2021 1501   LDLCALC 61 05/07/2021 1501  August 2023: BUN 32, creatinine 1.26  Other Studies Reviewed Today:  Echocardiogram 05/08/2021:  1. Left ventricular ejection fraction, by estimation, is 50%. The left  ventricle has low normal function. The left ventricle has no regional wall  motion abnormalities. There is mild left ventricular hypertrophy. Left  ventricular diastolic parameters are   indeterminate.   2. Right ventricular systolic function is low normal. The right  ventricular size is moderately enlarged. There is mildly elevated  pulmonary artery systolic pressure.   3. Left atrial size was moderately dilated.   4. The mitral valve is abnormal. Moderate mitral valve regurgitation. No  evidence of mitral stenosis.   5. The tricuspid valve is abnormal. Tricuspid valve regurgitation is mild  to moderate.   6. The aortic valve is tricuspid. There is moderate calcification of the  aortic valve. There is moderate thickening of the aortic valve. Aortic  valve regurgitation is mild to moderate.   7. There is mild dilatation of the aortic root, measuring 42 mm.   8. The inferior vena cava is normal in size with greater than 50%  respiratory variability, suggesting right atrial pressure of 3 mmHg.    Lexiscan Myoview 05/08/2021:   Findings are consistent with prior inferior myocardial infarction with very mild  peri-infarct ischemia. The study is high risk based on decreased LVEF, there is very mild current ischemia. Recomend correlating LVEF with echo. May be technical issue, grossly LVEF looks better than reported.   No ST deviation was noted.   Left ventricular function is normal. Nuclear stress EF: 33 %. The left ventricular ejection fraction is moderately decreased (30-44%). End diastolic cavity size is normal. Slightly elevated TID could suggest a component of balanced ischemia.  Assessment and Plan:  1.  Permanent atrial fibrillation with CHA2DS2-VASc score of 3.  He is asymptomatic with good heart rate control on Cardizem CD and Toprol-XL.  Continue Eliquis for stroke prophylaxis.  Check CBC and BMET for next visit.  2.  Multivessel CAD with mild to moderate left system disease  and occluded RCA associated with left-to-right collaterals which we have been managing medically.  He reports no progressive angina symptoms, follow-up Lexiscan Myoview from earlier in the year as noted above.  Plan to continue present course including Toprol-XL, Cozaar, Imdur, and Lipitor.  Most recent LDL 61.  Medication Adjustments/Labs and Tests Ordered: Current medicines are reviewed at length with the patient today.  Concerns regarding medicines are outlined above.   Tests Ordered: Orders Placed This Encounter  Procedures   CBC   Basic metabolic panel   Lipid Profile    Medication Changes: No orders of the defined types were placed in this encounter.   Disposition:  Follow up  6 months.  Signed, Satira Sark, MD, [facility] 12/02/2021 3:34 PM    [place] [facility] [address], [place], [place] [address] Phone: [phone]; Fax: [phone]

## 2021-12-02 NOTE — Patient Instructions (Signed)
Medication Instructions:  Your physician recommends that you continue on your current medications as directed. Please refer to the Current Medication list given to you today.    Please complete Patient Assistance Form with proof of household income. (Either tax return or social security benefits statement)  Labwork: DUE IN APRIL 2024: Fasting Lipids, BMET,CBC  Testing/Procedures: None today  Follow-Up: 6 months  Any Other Special Instructions Will Be Listed Below (If Applicable).  If you need a refill on your cardiac medications before your next appointment, please call your pharmacy.

## 2021-12-07 DIAGNOSIS — H26491 Other secondary cataract, right eye: Secondary | ICD-10-CM | POA: Diagnosis not present

## 2021-12-07 DIAGNOSIS — H26492 Other secondary cataract, left eye: Secondary | ICD-10-CM | POA: Diagnosis not present

## 2021-12-22 DIAGNOSIS — H16292 Other keratoconjunctivitis, left eye: Secondary | ICD-10-CM | POA: Diagnosis not present

## 2022-01-06 DIAGNOSIS — E119 Type 2 diabetes mellitus without complications: Secondary | ICD-10-CM | POA: Diagnosis not present

## 2022-01-06 DIAGNOSIS — H353132 Nonexudative age-related macular degeneration, bilateral, intermediate dry stage: Secondary | ICD-10-CM | POA: Diagnosis not present

## 2022-01-11 DIAGNOSIS — E1140 Type 2 diabetes mellitus with diabetic neuropathy, unspecified: Secondary | ICD-10-CM | POA: Diagnosis not present

## 2022-01-11 DIAGNOSIS — I739 Peripheral vascular disease, unspecified: Secondary | ICD-10-CM | POA: Diagnosis not present

## 2022-01-11 DIAGNOSIS — M25579 Pain in unspecified ankle and joints of unspecified foot: Secondary | ICD-10-CM | POA: Diagnosis not present

## 2022-01-11 DIAGNOSIS — M79675 Pain in left toe(s): Secondary | ICD-10-CM | POA: Diagnosis not present

## 2022-01-11 DIAGNOSIS — M79674 Pain in right toe(s): Secondary | ICD-10-CM | POA: Diagnosis not present

## 2022-01-11 DIAGNOSIS — M79671 Pain in right foot: Secondary | ICD-10-CM | POA: Diagnosis not present

## 2022-01-11 DIAGNOSIS — M79672 Pain in left foot: Secondary | ICD-10-CM | POA: Diagnosis not present

## 2022-01-11 DIAGNOSIS — M792 Neuralgia and neuritis, unspecified: Secondary | ICD-10-CM | POA: Diagnosis not present

## 2022-01-19 DIAGNOSIS — M5416 Radiculopathy, lumbar region: Secondary | ICD-10-CM | POA: Diagnosis not present

## 2022-01-22 DIAGNOSIS — M4807 Spinal stenosis, lumbosacral region: Secondary | ICD-10-CM | POA: Diagnosis not present

## 2022-01-22 DIAGNOSIS — M545 Low back pain, unspecified: Secondary | ICD-10-CM | POA: Diagnosis not present

## 2022-01-22 DIAGNOSIS — M4726 Other spondylosis with radiculopathy, lumbar region: Secondary | ICD-10-CM | POA: Diagnosis not present

## 2022-01-22 DIAGNOSIS — M4316 Spondylolisthesis, lumbar region: Secondary | ICD-10-CM | POA: Diagnosis not present

## 2022-01-22 DIAGNOSIS — M5117 Intervertebral disc disorders with radiculopathy, lumbosacral region: Secondary | ICD-10-CM | POA: Diagnosis not present

## 2022-01-22 DIAGNOSIS — M4727 Other spondylosis with radiculopathy, lumbosacral region: Secondary | ICD-10-CM | POA: Diagnosis not present

## 2022-01-22 DIAGNOSIS — M5136 Other intervertebral disc degeneration, lumbar region: Secondary | ICD-10-CM | POA: Diagnosis not present

## 2022-01-22 DIAGNOSIS — M48061 Spinal stenosis, lumbar region without neurogenic claudication: Secondary | ICD-10-CM | POA: Diagnosis not present

## 2022-01-26 DIAGNOSIS — G629 Polyneuropathy, unspecified: Secondary | ICD-10-CM | POA: Diagnosis not present

## 2022-01-26 DIAGNOSIS — I482 Chronic atrial fibrillation, unspecified: Secondary | ICD-10-CM | POA: Diagnosis not present

## 2022-01-26 DIAGNOSIS — I10 Essential (primary) hypertension: Secondary | ICD-10-CM | POA: Diagnosis not present

## 2022-01-26 DIAGNOSIS — Z6828 Body mass index (BMI) 28.0-28.9, adult: Secondary | ICD-10-CM | POA: Diagnosis not present

## 2022-01-26 DIAGNOSIS — E1165 Type 2 diabetes mellitus with hyperglycemia: Secondary | ICD-10-CM | POA: Diagnosis not present

## 2022-03-08 ENCOUNTER — Other Ambulatory Visit: Payer: Self-pay | Admitting: Cardiology

## 2022-03-13 ENCOUNTER — Emergency Department (HOSPITAL_COMMUNITY): Payer: Medicare Other

## 2022-03-13 ENCOUNTER — Encounter (HOSPITAL_COMMUNITY): Payer: Self-pay

## 2022-03-13 ENCOUNTER — Other Ambulatory Visit: Payer: Self-pay

## 2022-03-13 ENCOUNTER — Inpatient Hospital Stay (HOSPITAL_COMMUNITY)
Admission: EM | Admit: 2022-03-13 | Discharge: 2022-03-15 | DRG: 194 | Disposition: A | Discharge status: Home Health | Payer: Medicare Other | Attending: Family Medicine | Admitting: Family Medicine

## 2022-03-13 DIAGNOSIS — J101 Influenza due to other identified influenza virus with other respiratory manifestations: Secondary | ICD-10-CM | POA: Diagnosis present

## 2022-03-13 DIAGNOSIS — R0689 Other abnormalities of breathing: Secondary | ICD-10-CM | POA: Diagnosis not present

## 2022-03-13 DIAGNOSIS — I25119 Atherosclerotic heart disease of native coronary artery with unspecified angina pectoris: Secondary | ICD-10-CM | POA: Diagnosis not present

## 2022-03-13 DIAGNOSIS — I4891 Unspecified atrial fibrillation: Secondary | ICD-10-CM | POA: Diagnosis not present

## 2022-03-13 DIAGNOSIS — Z7901 Long term (current) use of anticoagulants: Secondary | ICD-10-CM | POA: Diagnosis not present

## 2022-03-13 DIAGNOSIS — R531 Weakness: Secondary | ICD-10-CM | POA: Diagnosis present

## 2022-03-13 DIAGNOSIS — K219 Gastro-esophageal reflux disease without esophagitis: Secondary | ICD-10-CM | POA: Diagnosis present

## 2022-03-13 DIAGNOSIS — E785 Hyperlipidemia, unspecified: Secondary | ICD-10-CM | POA: Diagnosis present

## 2022-03-13 DIAGNOSIS — I119 Hypertensive heart disease without heart failure: Secondary | ICD-10-CM | POA: Diagnosis present

## 2022-03-13 DIAGNOSIS — I252 Old myocardial infarction: Secondary | ICD-10-CM | POA: Diagnosis not present

## 2022-03-13 DIAGNOSIS — E876 Hypokalemia: Secondary | ICD-10-CM | POA: Diagnosis present

## 2022-03-13 DIAGNOSIS — R062 Wheezing: Secondary | ICD-10-CM | POA: Diagnosis not present

## 2022-03-13 DIAGNOSIS — R509 Fever, unspecified: Secondary | ICD-10-CM | POA: Diagnosis not present

## 2022-03-13 DIAGNOSIS — I4821 Permanent atrial fibrillation: Secondary | ICD-10-CM | POA: Diagnosis present

## 2022-03-13 DIAGNOSIS — Z79899 Other long term (current) drug therapy: Secondary | ICD-10-CM | POA: Diagnosis not present

## 2022-03-13 DIAGNOSIS — Z86711 Personal history of pulmonary embolism: Secondary | ICD-10-CM

## 2022-03-13 DIAGNOSIS — E119 Type 2 diabetes mellitus without complications: Secondary | ICD-10-CM | POA: Diagnosis not present

## 2022-03-13 DIAGNOSIS — Z7984 Long term (current) use of oral hypoglycemic drugs: Secondary | ICD-10-CM | POA: Diagnosis not present

## 2022-03-13 DIAGNOSIS — J111 Influenza due to unidentified influenza virus with other respiratory manifestations: Principal | ICD-10-CM

## 2022-03-13 DIAGNOSIS — I2510 Atherosclerotic heart disease of native coronary artery without angina pectoris: Secondary | ICD-10-CM | POA: Diagnosis present

## 2022-03-13 DIAGNOSIS — R000 Tachycardia, unspecified: Secondary | ICD-10-CM | POA: Diagnosis not present

## 2022-03-13 DIAGNOSIS — R059 Cough, unspecified: Secondary | ICD-10-CM | POA: Diagnosis not present

## 2022-03-13 DIAGNOSIS — G4489 Other headache syndrome: Secondary | ICD-10-CM | POA: Diagnosis not present

## 2022-03-13 DIAGNOSIS — Z87891 Personal history of nicotine dependence: Secondary | ICD-10-CM | POA: Diagnosis not present

## 2022-03-13 DIAGNOSIS — Z1152 Encounter for screening for COVID-19: Secondary | ICD-10-CM | POA: Diagnosis not present

## 2022-03-13 DIAGNOSIS — I4811 Longstanding persistent atrial fibrillation: Secondary | ICD-10-CM | POA: Diagnosis not present

## 2022-03-13 DIAGNOSIS — Z888 Allergy status to other drugs, medicaments and biological substances status: Secondary | ICD-10-CM | POA: Diagnosis not present

## 2022-03-13 DIAGNOSIS — Z8249 Family history of ischemic heart disease and other diseases of the circulatory system: Secondary | ICD-10-CM

## 2022-03-13 HISTORY — DX: Unspecified atrial fibrillation: I48.91

## 2022-03-13 HISTORY — DX: Personal history of other medical treatment: Z92.89

## 2022-03-13 LAB — CBC WITH DIFFERENTIAL/PLATELET
Abs Immature Granulocytes: 0.05 10*3/uL (ref 0.00–0.07)
Basophils Absolute: 0 10*3/uL (ref 0.0–0.1)
Basophils Relative: 1 %
Eosinophils Absolute: 0 10*3/uL (ref 0.0–0.5)
Eosinophils Relative: 0 %
HCT: 40.5 % (ref 39.0–52.0)
Hemoglobin: 13.1 g/dL (ref 13.0–17.0)
Immature Granulocytes: 1 %
Lymphocytes Relative: 9 %
Lymphs Abs: 0.7 10*3/uL (ref 0.7–4.0)
MCH: 30.8 pg (ref 26.0–34.0)
MCHC: 32.3 g/dL (ref 30.0–36.0)
MCV: 95.3 fL (ref 80.0–100.0)
Monocytes Absolute: 0.9 10*3/uL (ref 0.1–1.0)
Monocytes Relative: 12 %
Neutro Abs: 5.9 10*3/uL (ref 1.7–7.7)
Neutrophils Relative %: 77 %
Platelets: 118 10*3/uL — ABNORMAL LOW (ref 150–400)
RBC: 4.25 MIL/uL (ref 4.22–5.81)
RDW: 15.8 % — ABNORMAL HIGH (ref 11.5–15.5)
WBC: 7.6 10*3/uL (ref 4.0–10.5)
nRBC: 0 % (ref 0.0–0.2)

## 2022-03-13 LAB — RESP PANEL BY RT-PCR (RSV, FLU A&B, COVID)  RVPGX2
Influenza A by PCR: POSITIVE — AB
Influenza B by PCR: NEGATIVE
Resp Syncytial Virus by PCR: NEGATIVE
SARS Coronavirus 2 by RT PCR: NEGATIVE

## 2022-03-13 LAB — COMPREHENSIVE METABOLIC PANEL
ALT: 19 U/L (ref 0–44)
AST: 27 U/L (ref 15–41)
Albumin: 3.9 g/dL (ref 3.5–5.0)
Alkaline Phosphatase: 61 U/L (ref 38–126)
Anion gap: 10 (ref 5–15)
BUN: 15 mg/dL (ref 8–23)
CO2: 25 mmol/L (ref 22–32)
Calcium: 8.1 mg/dL — ABNORMAL LOW (ref 8.9–10.3)
Chloride: 102 mmol/L (ref 98–111)
Creatinine, Ser: 0.78 mg/dL (ref 0.61–1.24)
GFR, Estimated: >60 mL/min (ref >60)
Glucose, Bld: 113 mg/dL — ABNORMAL HIGH (ref 70–99)
Potassium: 3.5 mmol/L (ref 3.5–5.1)
Sodium: 137 mmol/L (ref 135–145)
Total Bilirubin: 1.2 mg/dL (ref 0.3–1.2)
Total Protein: 6.9 g/dL (ref 6.5–8.1)

## 2022-03-13 LAB — GLUCOSE, CAPILLARY
Glucose-Capillary: 106 mg/dL — ABNORMAL HIGH (ref 70–99)
Glucose-Capillary: 122 mg/dL — ABNORMAL HIGH (ref 70–99)
Glucose-Capillary: 117 mg/dL — ABNORMAL HIGH (ref 70–99)
Glucose-Capillary: 181 mg/dL — ABNORMAL HIGH (ref 70–99)

## 2022-03-13 LAB — PROCALCITONIN: Procalcitonin: <0.1 ng/mL

## 2022-03-13 MED ORDER — DILTIAZEM HCL ER COATED BEADS 240 MG PO CP24
240.0000 mg | ORAL_CAPSULE | Freq: Every day | ORAL | Status: DC
  Administered 2022-03-13 – 2022-03-15 (×3): 240 mg via ORAL
  Filled 2022-03-13 (×3): qty 1

## 2022-03-13 MED ORDER — ALBUTEROL SULFATE (2.5 MG/3ML) 0.083% IN NEBU
2.5000 mg | INHALATION_SOLUTION | RESPIRATORY_TRACT | Status: DC | PRN
  Administered 2022-03-14 – 2022-03-15 (×2): 2.5 mg via RESPIRATORY_TRACT
  Filled 2022-03-13 (×2): qty 3

## 2022-03-13 MED ORDER — ACETAMINOPHEN 325 MG PO TABS
650.0000 mg | ORAL_TABLET | ORAL | Status: DC
  Administered 2022-03-13 – 2022-03-14 (×5): 650 mg via ORAL
  Filled 2022-03-13 (×5): qty 2

## 2022-03-13 MED ORDER — METOPROLOL SUCCINATE ER 50 MG PO TB24
50.0000 mg | ORAL_TABLET | Freq: Every day | ORAL | Status: DC
  Administered 2022-03-13 – 2022-03-15 (×3): 50 mg via ORAL
  Filled 2022-03-13 (×3): qty 1

## 2022-03-13 MED ORDER — METOPROLOL TARTRATE 5 MG/5ML IV SOLN
5.0000 mg | Freq: Once | INTRAVENOUS | Status: AC
  Administered 2022-03-13: 5 mg via INTRAVENOUS
  Filled 2022-03-13: qty 5

## 2022-03-13 MED ORDER — INSULIN ASPART 100 UNIT/ML IJ SOLN
0.0000 [IU] | Freq: Three times a day (TID) | INTRAMUSCULAR | Status: DC
  Administered 2022-03-13: 2 [IU] via SUBCUTANEOUS
  Administered 2022-03-14 (×2): 3 [IU] via SUBCUTANEOUS
  Administered 2022-03-15: 2 [IU] via SUBCUTANEOUS

## 2022-03-13 MED ORDER — ISOSORBIDE MONONITRATE ER 60 MG PO TB24
30.0000 mg | ORAL_TABLET | Freq: Every day | ORAL | Status: DC
  Administered 2022-03-13 – 2022-03-15 (×3): 30 mg via ORAL
  Filled 2022-03-13 (×3): qty 1

## 2022-03-13 MED ORDER — OSELTAMIVIR PHOSPHATE 75 MG PO CAPS
75.0000 mg | ORAL_CAPSULE | Freq: Two times a day (BID) | ORAL | Status: DC
  Administered 2022-03-14 – 2022-03-15 (×3): 75 mg via ORAL
  Filled 2022-03-13 (×3): qty 1

## 2022-03-13 MED ORDER — OSELTAMIVIR PHOSPHATE 75 MG PO CAPS
75.0000 mg | ORAL_CAPSULE | Freq: Once | ORAL | Status: AC
  Administered 2022-03-13: 75 mg via ORAL
  Filled 2022-03-13: qty 1

## 2022-03-13 MED ORDER — ONDANSETRON HCL 4 MG/2ML IJ SOLN: 4.0000 mg | Freq: Four times a day (QID) | INTRAMUSCULAR | Status: DC | PRN

## 2022-03-13 MED ORDER — ACETAMINOPHEN 650 MG RE SUPP: 650.0000 mg | RECTAL | Status: DC

## 2022-03-13 MED ORDER — ACETAMINOPHEN 650 MG RE SUPP: 650.0000 mg | Freq: Four times a day (QID) | RECTAL | Status: DC | PRN

## 2022-03-13 MED ORDER — INSULIN ASPART 100 UNIT/ML IJ SOLN: 0.0000 [IU] | Freq: Every day | INTRAMUSCULAR | Status: DC

## 2022-03-13 MED ORDER — GABAPENTIN 300 MG PO CAPS
300.0000 mg | ORAL_CAPSULE | Freq: Two times a day (BID) | ORAL | Status: DC
  Administered 2022-03-13 – 2022-03-15 (×5): 300 mg via ORAL
  Filled 2022-03-13 (×5): qty 1

## 2022-03-13 MED ORDER — ATORVASTATIN CALCIUM 40 MG PO TABS
80.0000 mg | ORAL_TABLET | Freq: Every evening | ORAL | Status: DC
  Administered 2022-03-13 – 2022-03-14 (×2): 80 mg via ORAL
  Filled 2022-03-13 (×2): qty 2

## 2022-03-13 MED ORDER — ISOSORBIDE MONONITRATE ER 30 MG PO TB24: 45.0000 mg | ORAL_TABLET | Freq: Every day | ORAL | Status: DC

## 2022-03-13 MED ORDER — SODIUM CHLORIDE 0.9 % IV SOLN: INTRAVENOUS | Status: DC

## 2022-03-13 MED ORDER — ACETAMINOPHEN 325 MG PO TABS
650.0000 mg | ORAL_TABLET | Freq: Four times a day (QID) | ORAL | Status: DC | PRN
  Administered 2022-03-13: 650 mg via ORAL
  Filled 2022-03-13: qty 2

## 2022-03-13 MED ORDER — SODIUM CHLORIDE 0.9 % IV BOLUS
500.0000 mL | Freq: Once | INTRAVENOUS | Status: AC
  Administered 2022-03-13: 500 mL via INTRAVENOUS

## 2022-03-13 MED ORDER — DAPAGLIFLOZIN PROPANEDIOL 10 MG PO TABS
10.0000 mg | ORAL_TABLET | Freq: Every day | ORAL | Status: DC
  Administered 2022-03-13 – 2022-03-15 (×3): 10 mg via ORAL
  Filled 2022-03-13 (×3): qty 1

## 2022-03-13 MED ORDER — MORPHINE SULFATE (PF) 2 MG/ML IV SOLN: 1.0000 mg | INTRAVENOUS | Status: DC | PRN

## 2022-03-13 MED ORDER — LOSARTAN POTASSIUM 50 MG PO TABS: 50.0000 mg | ORAL_TABLET | Freq: Every day | ORAL | Status: DC

## 2022-03-13 MED ORDER — ACETAMINOPHEN 500 MG PO TABS
1000.0000 mg | ORAL_TABLET | Freq: Once | ORAL | Status: AC
  Administered 2022-03-13: 1000 mg via ORAL
  Filled 2022-03-13: qty 2

## 2022-03-13 MED ORDER — OXYCODONE HCL 5 MG PO TABS: 5.0000 mg | ORAL_TABLET | ORAL | Status: DC | PRN

## 2022-03-13 MED ORDER — ONDANSETRON HCL 4 MG PO TABS: 4.0000 mg | ORAL_TABLET | Freq: Four times a day (QID) | ORAL | Status: DC | PRN

## 2022-03-13 MED ORDER — DILTIAZEM HCL ER COATED BEADS 240 MG PO CP24: 240.0000 mg | ORAL_CAPSULE | Freq: Every day | ORAL | Status: DC

## 2022-03-13 MED ORDER — APIXABAN 5 MG PO TABS
5.0000 mg | ORAL_TABLET | Freq: Two times a day (BID) | ORAL | Status: DC
  Administered 2022-03-13 – 2022-03-15 (×5): 5 mg via ORAL
  Filled 2022-03-13 (×5): qty 1

## 2022-03-13 MED ORDER — MORPHINE SULFATE (PF) 2 MG/ML IV SOLN: 2.0000 mg | INTRAVENOUS | Status: DC | PRN

## 2022-03-13 NOTE — Progress Notes (Signed)
Telemetry was discontinued per order. Cental tele informed. Richard Corona [name]

## 2022-03-13 NOTE — Assessment & Plan Note (Addendum)
-   Rate much improved now - Currently in atrial fibrillation - Continue Eliquis - Continue Cardizem - Monitor on telemetry

## 2022-03-13 NOTE — Assessment & Plan Note (Addendum)
-   Hold metformin - Continue Farxiga - Sliding scale coverage CBG (last 3)  Recent Labs    03/13/22 2111 03/14/22 0747 03/14/22 1144  GLUCAP 181* 113* 181*

## 2022-03-13 NOTE — ED Notes (Signed)
ED Provider at bedside. 

## 2022-03-13 NOTE — ED Notes (Signed)
Ambulated Pt on Pulse Ox around [place] per EDP request. Pt presented with unsteady gait with 1-person stand by assist. Pt required a walker in order to ambulate. Pt maintained 95%-96% on room air with a HR that did elevate to 140 BPM after completing 1 lap around [facility] Station. EDP Notified and aware.

## 2022-03-13 NOTE — Plan of Care (Signed)
  Problem: Acute Rehab PT Goals(only PT should resolve) Goal: Pt Will Go Supine/Side To Sit Outcome: Progressing Flowsheets (Taken 03/13/2022 0949) Pt will go Supine/Side to Sit: Independently Goal: Patient Will Transfer Sit To/From Stand Outcome: Progressing Flowsheets (Taken 03/13/2022 0949) Patient will transfer sit to/from stand: with supervision Goal: Pt Will Transfer Bed To Chair/Chair To Bed Outcome: Progressing Flowsheets (Taken 03/13/2022 0949) Pt will Transfer Bed to Chair/Chair to Bed: with supervision Goal: Pt Will Ambulate Outcome: Progressing Flowsheets (Taken 03/13/2022 0949) Pt will Ambulate:  100 feet  with supervision  with least restrictive assistive device

## 2022-03-13 NOTE — TOC Initial Note (Signed)
Transition of Care [facility]) - Initial/Assessment Note    Patient Details  Name: Richard [name] MRN: [id number] Date of Birth: 05/28/1933  Transition of Care [facility]) CM/SW Contact:    Boneta Lucks, RN Phone Number: 03/13/2022, 2:40 PM  Clinical Narrative:         Patient admitted with Flu, OBS status explained to his wife. PT is recommending HHPT. She is agreeable. TOC set up before discharge, no preferences.           Expected Discharge Plan: [place] Barriers to Discharge: Continued Medical Work up   Patient Goals and CMS Choice Patient states their goals for this hospitalization and ongoing recovery are:: to go home. CMS Medicare.gov Compare Post Acute Care list provided to:: Patient Represenative (must comment) Choice offered to / list presented to : Spouse      Expected Discharge Plan and Services       Living arrangements for the past 2 months: Single Family Home                  Prior Living Arrangements/Services Living arrangements for the past 2 months: Single Family Home Lives with:: Spouse            Activities of Daily Living Home Assistive Devices/Equipment: None ADL Screening (condition at time of admission) Patient's cognitive ability adequate to safely complete daily activities?: Yes Is the patient deaf or have difficulty hearing?: Yes Does the patient have difficulty seeing, even when wearing glasses/contacts?: Yes Does the patient have difficulty concentrating, remembering, or making decisions?: No Patient able to express need for assistance with ADLs?: Yes Does the patient have difficulty dressing or bathing?: No Independently performs ADLs?: Yes (appropriate for developmental age) Does the patient have difficulty walking or climbing stairs?: Yes Weakness of Legs: Both Weakness of Arms/Hands: None  Permission Sought/Granted        Admission diagnosis:  Influenza A [J10.1] Atrial fibrillation with RVR ([place])  [I48.91] Influenza [J11.1] Patient Active Problem List   Diagnosis Date Noted   Influenza A 03/13/2022   Trochanteric bursitis, right hip 10/15/2021   Other intervertebral disc degeneration, lumbar region 10/15/2021   Chest pain 05/07/2021   DM (diabetes mellitus) ([place]) 05/07/2021   A-fib ([place]) 05/07/2021   Unstable angina ([place]) 01/21/2012   Hypertensive heart disease    GERD (gastroesophageal reflux disease)    Obesity (BMI 30.0-34.9)    CAD (coronary artery disease)    Old inferior myocardial infarction    Hyperlipidemia    PCP:  Practice, [place]:   [facility] [address] [place] [address] [place] [address] Phone: [phone] Fax: [phone]     Social Determinants of Health (SDOH) Social History: SDOH Screenings   Food Insecurity: No Food Insecurity (03/13/2022)  Housing: Low Risk  (03/13/2022)  Transportation Needs: No Transportation Needs (03/13/2022)  Utilities: Not At Risk (03/13/2022)  Tobacco Use: Medium Risk (03/13/2022)

## 2022-03-13 NOTE — Progress Notes (Signed)
   03/13/22 1500  Assess: MEWS Score  Temp (!) 101.5 F (38.6 C)  BP 138/74  MAP (mmHg) 92  Pulse Rate 95  Resp (!) 21  Level of Consciousness Alert  SpO2 92 %  O2 Device Room Air  Assess: MEWS Score  MEWS Temp 2  MEWS Systolic 0  MEWS Pulse 0  MEWS RR 1  MEWS LOC 0  MEWS Score 3  MEWS Score Color Yellow  Assess: if the MEWS score is Yellow or Red  Were vital signs taken at a resting state? Yes  Focused Assessment Change from prior assessment (see assessment flowsheet)  Does the patient meet 2 or more of the SIRS criteria? No  MEWS guidelines implemented *See Row Information* Yes  Treat  Pain Scale 0-10  Pain Score 0  Notify: Charge Nurse/RN  Name of Charge Nurse/RN Notified Scherrie November, RN  Date Charge Nurse/RN Notified 03/13/22  Time Charge Nurse/RN Notified 1601  Provider Notification  Provider Name/Title Dr. Wynetta Emery, MD  Date Provider Notified 03/13/22  Time Provider Notified 76  Method of Notification Page  Notification Reason Change in status  Provider response See new orders  Date of Provider Response 03/13/22  Time of Provider Response 1601  Assess: SIRS CRITERIA  SIRS Temperature  1  SIRS Pulse 1  SIRS Respirations  1  SIRS WBC 0  SIRS Score Sum  3   Tylenol given, will recheck vitals

## 2022-03-13 NOTE — Assessment & Plan Note (Addendum)
-   Flu positive on admission - Symptomatic since January 1 - Generalized weakness, cough, fever - Tamiflu course to be completed - Encourage p.o. fluid intake - Continue supportive care

## 2022-03-13 NOTE — Care Management Obs Status (Signed)
[place] NOTIFICATION   Patient Details  Name: Richard [name] MRN: [id number] Date of Birth: March 19, 1933   Medicare Observation Status Notification Given:  Yes    Boneta Lucks, RN 03/13/2022, 2:40 PM

## 2022-03-13 NOTE — Progress Notes (Signed)
[place] OF CARE NOTE   03/13/2022 5:05 PM  Richard [name] was seen and examined.  The H&P by the admitting provider, orders, imaging was reviewed.  Please see new orders.  Will continue to follow.   Vitals:   03/13/22 1234 03/13/22 1500  BP: 124/73 138/74  Pulse: 97 95  Resp: (!) 24 (!) 21  Temp: 99.1 F (37.3 C) (!) 101.5 F (38.6 C)  SpO2: 93% 92%    Results for orders placed or performed during the hospital encounter of 03/13/22  Resp panel by RT-PCR (RSV, Flu A&B, Covid) Anterior Nasal Swab   Specimen: Anterior Nasal Swab  Result Value Ref Range   SARS Coronavirus 2 by RT PCR NEGATIVE NEGATIVE   Influenza A by PCR POSITIVE (A) NEGATIVE   Influenza B by PCR NEGATIVE NEGATIVE   Resp Syncytial Virus by PCR NEGATIVE NEGATIVE  Culture, blood (Routine X 2) w Reflex to ID Panel   Specimen: BLOOD  Result Value Ref Range   Specimen Description BLOOD BLOOD LEFT HAND    Special Requests      BOTTLES DRAWN AEROBIC AND ANAEROBIC Blood Culture adequate volume Performed at [facility], [address], [place], [place] [address]    Culture PENDING    Report Status PENDING   Culture, blood (Routine X 2) w Reflex to ID Panel   Specimen: BLOOD  Result Value Ref Range   Specimen Description BLOOD RIGHT ANTECUBITAL    Special Requests      BOTTLES DRAWN AEROBIC AND ANAEROBIC Blood Culture adequate volume Performed at [facility], [address] [place], [place] [address]    Culture PENDING    Report Status PENDING   CBC with Differential  Result Value Ref Range   WBC 7.6 4.0 - 10.5 K/uL   RBC 4.25 4.22 - 5.81 MIL/uL   Hemoglobin 13.1 13.0 - 17.0 g/dL   HCT 40.5 39.0 - 52.0 %   MCV 95.3 80.0 - 100.0 fL   MCH 30.8 26.0 - 34.0 pg   MCHC 32.3 30.0 - 36.0 g/dL   RDW 15.8 (H) 11.5 - 15.5 %   Platelets 118 (L) 150 - 400 K/uL   nRBC 0.0 0.0 - 0.2 %   Neutrophils Relative % 77 %   Neutro Abs 5.9 1.7 - 7.7 K/uL   Lymphocytes Relative 9 %   Lymphs Abs 0.7 0.7 - 4.0 K/uL    Monocytes Relative 12 %   Monocytes Absolute 0.9 0.1 - 1.0 K/uL   Eosinophils Relative 0 %   Eosinophils Absolute 0.0 0.0 - 0.5 K/uL   Basophils Relative 1 %   Basophils Absolute 0.0 0.0 - 0.1 K/uL   Immature Granulocytes 1 %   Abs Immature Granulocytes 0.05 0.00 - 0.07 K/uL  Comprehensive metabolic panel  Result Value Ref Range   Sodium 137 135 - 145 mmol/L   Potassium 3.5 3.5 - 5.1 mmol/L   Chloride 102 98 - 111 mmol/L   CO2 25 22 - 32 mmol/L   Glucose, Bld 113 (H) 70 - 99 mg/dL   BUN 15 8 - 23 mg/dL   Creatinine, Ser 0.78 0.61 - 1.24 mg/dL   Calcium 8.1 (L) 8.9 - 10.3 mg/dL   Total Protein 6.9 6.5 - 8.1 g/dL   Albumin 3.9 3.5 - 5.0 g/dL   AST 27 15 - 41 U/L   ALT 19 0 - 44 U/L   Alkaline Phosphatase 61 38 - 126 U/L   Total Bilirubin 1.2 0.3 - 1.2  mg/dL   GFR, Estimated >60 >60 mL/min   Anion gap 10 5 - 15  Glucose, capillary  Result Value Ref Range   Glucose-Capillary 106 (H) 70 - 99 mg/dL  Glucose, capillary  Result Value Ref Range   Glucose-Capillary 122 (H) 70 - 99 mg/dL  Glucose, capillary  Result Value Ref Range   Glucose-Capillary 117 (H) 70 - 99 mg/dL   Murvin Natal, MD [facility]   03/13/2022  1:36 AM How to contact the [facility] Attending or Consulting provider [place] or covering provider during after hours [place], for this patient?  Check the care team in [facility] and look for a) attending/consulting TRH provider listed and b) the [facility] team listed Log into [email or web address] and use [place]'s universal password to access. If you do not have the password, please contact the hospital operator. Locate the [facility] provider you are looking for under [facility] and page to a number that you can be directly reached. If you still have difficulty reaching the provider, please page the [facility] (Director on Call) for the Hospitalists listed on amion for assistance.

## 2022-03-13 NOTE — ED Triage Notes (Addendum)
Pt presents with weakness, fever, cough, nasal congestion and headache. Temp of 100.7. Pt showing a fib on monitor at a rate of 125. O2 sats at 92% RA  Pt given 1g of Rocephin in route

## 2022-03-13 NOTE — Evaluation (Signed)
Physical Therapy Evaluation Patient Details Name: Richard [name] MRN: [id number] DOB: [birth year] Today's Date: 03/13/2022  History of Present Illness  Richard [name] is a 89 y.o. male with medical history significant of atrial fibrillation, coronary artery disease, GERD, hypertensive heart disease, paroxysmal atrial fibrillation, history of pulmonary embolism, and more presents the ED with a chief complaint of generalized weakness.  Patient reports he was so weak in the day of presentation that he could not stand up.  Wife at bedside reports that he started having weakness on 1 January.  She reports he had family over for the holiday and they had to help him get up off the couch.  Today he physically could not get up off the commode and had to call his wife and to help him.  At baseline he walks without a cane or walker-totally independent.  Today once he did get him up he used a walker, but then ended up calling EMS because they did not feel safe getting him to the vehicle.  Patient reports she has had a dry cough and fever.  Patient reports a Tmax of 101.  He denies chest pain, dyspnea, nausea, vomiting, diarrhea.  His wife was sick before him and is the only sick contact they know of.  He complained of rhinorrhea and sore throat couple days ago but that is not bothering him now.  Patient has no other complaints at this time.   Clinical Impression  Patient lying in bed on therapist arrival; wife at bedside.  Patient agreeable to therapy assessment.  Patient able to move from supine to sit modified independently; takes extra time.  Once sitting on the edge of the bed, patient able to sit with feet supported with SBA only.  Patient performs sit to stand to RW with min guard from therapist for safety and walks in the room with min guard and RW; noted decreased gait speed.  Patient returns to bed with min guard from therapist.  States he has a headache and PT notifies nursing.  Patient will benefit  from continued skilled therapy services during the remainder of his hospital stay and at the next recommended venue of care to address deficits and promote return to optimal function.          Recommendations for follow up therapy are one component of a multi-disciplinary discharge planning process, led by the attending physician.  Recommendations may be updated based on patient status, additional functional criteria and insurance authorization.  Follow Up Recommendations Home health PT      Assistance Recommended at Discharge Intermittent Supervision/Assistance  Patient can return home with the following  A little help with walking and/or transfers;A little help with bathing/dressing/bathroom;Help with stairs or ramp for entrance    Equipment Recommendations None recommended by PT  Recommendations for Other Services       Functional Status Assessment Patient has had a recent decline in their functional status and demonstrates the ability to make significant improvements in function in a reasonable and predictable amount of time.     Precautions / Restrictions Precautions Precautions: Fall Restrictions Weight Bearing Restrictions: No      Mobility  Bed Mobility Overal bed mobility: Modified Independent             General bed mobility comments: takes extra time Patient Response: Cooperative  Transfers Overall transfer level: Needs assistance Equipment used: Rolling walker (2 wheels) Transfers: Sit to/from Stand Sit to Stand: Min guard  General transfer comment: sit to stand to RW; takes extra time and effort    Ambulation/Gait Ambulation/Gait assistance: Min guard Gait Distance (Feet): 25 Feet Assistive device: Rolling walker (2 wheels) Gait Pattern/deviations: Decreased step length - left, Decreased step length - right       General Gait Details: slower than normal gait speed  Stairs            Wheelchair Mobility    Modified Rankin  (Stroke Patients Only)       Balance Overall balance assessment: Needs assistance Sitting-balance support: Feet supported, Bilateral upper extremity supported Sitting balance-Leahy Scale: Good Sitting balance - Comments: good sitting balance on EOB with feet supported   Standing balance support: Bilateral upper extremity supported, During functional activity, Reliant on assistive device for balance Standing balance-Leahy Scale: Good Standing balance comment: fair to good standing balance with RW and CGA from therapist for safety                             Pertinent Vitals/Pain Pain Assessment Pain Assessment: 0-10 Pain Score: 3  Pain Location: headache Pain Intervention(s): Monitored during session    Home Living Family/patient expects to be discharged to:: Private residence Living Arrangements: Spouse/significant other Available Help at Discharge: Family;Available 24 hours/day Type of Home: House Home Access: Stairs to enter Entrance Stairs-Rails: None Entrance Stairs-Number of Steps: 2   Home Layout: One level Home Equipment: Grab bars - tub/shower;Rolling Walker (2 wheels);Cane - single point;Shower seat;BSC/3in1 Additional Comments: wife has equipment from previous TKA; patient has never needed DME    Prior Function Prior Level of Function : Independent/Modified Independent             Mobility Comments: 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       Hand Dominance   Dominant Hand: Right    Extremity/Trunk Assessment   Upper Extremity Assessment Upper Extremity Assessment: Generalized weakness    Lower Extremity Assessment Lower Extremity Assessment: Generalized weakness    Cervical / Trunk Assessment Cervical / Trunk Assessment: Normal  Communication   Communication: No difficulties  Cognition Arousal/Alertness: Awake/alert Behavior During Therapy: WFL for tasks assessed/performed Overall Cognitive Status: Within  Functional Limits for tasks assessed                                          General Comments      Exercises     Assessment/Plan    PT Assessment Patient needs continued PT services  PT Problem List Decreased strength;Decreased activity tolerance;Decreased mobility;Decreased balance;Pain       PT Treatment Interventions DME instruction;Therapeutic activities;Balance training;Gait training;Functional mobility training;Therapeutic exercise;Neuromuscular re-education;Patient/family education    PT Goals (Current goals can be found in the Care Plan section)  Acute Rehab PT Goals Patient Stated Goal: return home PT Goal Formulation: With patient/family Time For Goal Achievement: 03/27/22 Potential to Achieve Goals: Good    Frequency       Co-evaluation               AM-PAC PT "6 Clicks" Mobility  Outcome Measure Help needed turning from your back to your side while in a flat bed without using bedrails?: None Help needed moving from lying on your back to sitting on the side of a flat bed without using bedrails?: None Help  needed moving to and from a bed to a chair (including a wheelchair)?: A Little Help needed standing up from a chair using your arms (e.g., wheelchair or bedside chair)?: A Little Help needed to walk in hospital room?: A Little Help needed climbing 3-5 steps with a railing? : A Lot 6 Click Score: 19    End of Session   Activity Tolerance: Patient tolerated treatment well;Patient limited by fatigue Patient left: in bed;with call bell/phone within reach;with nursing/sitter in room;with family/visitor present Nurse Communication: Mobility status PT Visit Diagnosis: Muscle weakness (generalized) (M62.81);Other abnormalities of gait and mobility (R26.89);Difficulty in walking, not elsewhere classified (R26.2)    Time: 1779-3903 PT Time Calculation (min) (ACUTE ONLY): 20 min   Charges:   PT Evaluation $PT Eval Low Complexity: 1 Low           9:49 AM, 03/13/22 Manley Fason Small Jeshua Ransford MPT [place] [facility] [place] [phone]

## 2022-03-13 NOTE — Assessment & Plan Note (Signed)
Continue statin. 

## 2022-03-13 NOTE — Assessment & Plan Note (Signed)
-   Continue statin, Imdur, ARB

## 2022-03-13 NOTE — ED Provider Notes (Signed)
[facility] EMERGENCY DEPARTMENT Provider Note   CSN: [id number] Arrival date & time: 03/13/22  0134     History  Chief Complaint  Patient presents with   Weakness    Richard [name] is a 89 y.o. male.  HPI     This is an 89 year old male with a history of permanent atrial fibrillation on Eliquis, diltiazem, Toprol, coronary artery disease who presents with generalized weakness.  His wife is at the bedside.  They state that they have both been sick over the last week since Monday with fever, cough, nasal congestion.  Wife reports negative COVID testing at home.  Temperature 100.7.  Patient reports that he is always in atrial fibrillation.  He denies any shortness of breath, chest pain, palpitations.  Patient states that he just was unable to get up this morning on his own.  He states that both his legs felt very weak.  Wife states that she was unable to assist him.  Denies unilateral weakness.  Per EMS, patient received a dose of Rocephin en route.  Home Medications Prior to Admission medications   Medication Sig Start Date End Date Taking? Authorizing Provider  acetaminophen (TYLENOL) 500 MG tablet Take 1,000 mg every 6 (six) hours as needed by mouth for mild pain or headache.    [provider]  atorvastatin (LIPITOR) 80 MG tablet Take 1 tablet by mouth in the evening 03/09/22   Satira Sark, MD  dapagliflozin propanediol (FARXIGA) 10 MG TABS tablet Take by mouth daily.    [provider]  diclofenac Sodium (VOLTAREN) 1 % GEL SMARTSIG:0.5 Gram(s) Topical Twice Daily 08/18/21   [provider]  diltiazem (CARDIZEM CD) 240 MG 24 hr capsule Take 1 capsule (240 mg total) by mouth daily. 05/13/17 12/02/21  Satira Sark, MD  ELIQUIS 5 MG TABS tablet TAKE 1 TABLET BY MOUTH TWICE DAILY Patient taking differently: Take 5 mg by mouth 2 (two) times daily. 01/26/18   Satira Sark, MD  furosemide (LASIX) 20 MG tablet Take 1 tablet (20 mg total) by  mouth daily as needed for edema or fluid. If gained weight greater than > 5 pounds in 24 hours 05/08/21 12/02/21  Shahmehdi, Valeria Batman, MD  gabapentin (NEURONTIN) 300 MG capsule Take 300 mg by mouth 2 (two) times daily. 10/20/17   [provider]  isosorbide mononitrate (IMDUR) 30 MG 24 hr tablet Take 1.5 tablets (45 mg total) by mouth daily. 07/14/21   Satira Sark, MD  losartan (COZAAR) 50 MG tablet Take 1 tablet by mouth once daily 06/01/21   Satira Sark, MD  metFORMIN (GLUCOPHAGE-XR) 500 MG 24 hr tablet Take 500 mg by mouth 2 (two) times daily.     [provider]  metoprolol succinate (TOPROL-XL) 50 MG 24 hr tablet Take 1 tablet (50 mg total) by mouth daily. Take with or immediately following a meal. 05/09/21 12/02/21  Shahmehdi, Valeria Batman, MD  nitroGLYCERIN (NITROSTAT) 0.4 MG SL tablet DISSOLVE 1 TABLET UNDER THE TONGUE EVERY 5 MINUTES AS NEEDED FOR CHEST PAIN. DO NOT EXCEED A TOTAL OF 3 DOSES IN 15 MINUTES. Patient taking differently: Place 0.4 mg under the tongue every 5 (five) minutes as needed for chest pain. 05/19/20   Satira Sark, MD      Allergies    Niaspan [niacin er]    Review of Systems   Review of Systems  Constitutional:  Positive for fatigue and fever.  HENT:  Positive for congestion.  Respiratory:  Positive for cough.   All other systems reviewed and are negative.   Physical Exam Updated Vital Signs BP 124/87   Pulse (!) 107   Temp 99 F (37.2 C) (Oral)   Resp (!) 22   Ht 1.727 m ('5\' 8"'$ )   Wt 81.2 kg   SpO2 92%   BMI 27.22 kg/m  Physical Exam Vitals and nursing note reviewed.  Constitutional:      Appearance: He is well-developed.     Comments: Elderly, nontoxic-appearing  HENT:     Head: Normocephalic and atraumatic.  Eyes:     Pupils: Pupils are equal, round, and reactive to light.  Cardiovascular:     Rate and Rhythm: Tachycardia present. Rhythm irregular.     Heart sounds: Normal heart sounds. No murmur heard. Pulmonary:      Effort: Pulmonary effort is normal. No respiratory distress.     Breath sounds: Normal breath sounds. No wheezing.  Abdominal:     Palpations: Abdomen is soft.     Tenderness: There is no abdominal tenderness. There is no rebound.  Musculoskeletal:     Cervical back: Neck supple.  Lymphadenopathy:     Cervical: No cervical adenopathy.  Skin:    General: Skin is warm and dry.  Neurological:     Mental Status: He is alert and oriented to person, place, and time.  Psychiatric:        Mood and Affect: Mood normal.     ED Results / Procedures / Treatments   Labs (all labs ordered are listed, but only abnormal results are displayed) Labs Reviewed  RESP PANEL BY RT-PCR (RSV, FLU A&B, COVID)  RVPGX2 - Abnormal; Notable for the following components:      Result Value   Influenza A by PCR POSITIVE (*)    All other components within normal limits  CBC WITH DIFFERENTIAL/PLATELET - Abnormal; Notable for the following components:   RDW 15.8 (*)    Platelets 118 (*)    All other components within normal limits  COMPREHENSIVE METABOLIC PANEL - Abnormal; Notable for the following components:   Glucose, Bld 113 (*)    Calcium 8.1 (*)    All other components within normal limits    EKG EKG Interpretation  Date/Time:  Saturday March 13 2022 01:42:44 EST Ventricular Rate:  126 PR Interval:    QRS Duration: 114 QT Interval:  297 QTC Calculation: 430 R Axis:   50 Text Interpretation: Atrial fibrillation Incomplete right bundle branch block Repolarization abnormality, prob rate related Confirmed by Thayer Jew [id number]) on 03/13/2022 2:46:02 AM  Radiology DG Chest Portable 1 View  Result Date: 03/13/2022 CLINICAL DATA:  Cough, fever. EXAM: PORTABLE CHEST 1 VIEW COMPARISON:  05/07/2021. FINDINGS: The heart is enlarged and the mediastinal contour is stable. Lung volumes are low. No consolidation, effusion, or pneumothorax. No acute osseous abnormality. IMPRESSION: 1. No active disease.  2. Cardiomegaly. Electronically Signed   By: Brett Fairy M.D.   On: 03/13/2022 02:45    Procedures Procedures    Medications Ordered in ED Medications  acetaminophen (TYLENOL) tablet 1,000 mg (1,000 mg Oral Given 03/13/22 0250)    ED Course/ Medical Decision Making/ A&P                           Medical Decision Making Amount and/or Complexity of Data Reviewed Labs: ordered. Radiology: ordered.  Risk OTC drugs.   This patient presents to the ED for concern  of cough, fever, weakness, this involves an extensive number of treatment options, and is a complaint that carries with it a high risk of complications and morbidity.  I considered the following differential and admission for this acute, potentially life threatening condition.  The differential diagnosis includes pneumonia, viral illness such as COVID or influenza, sepsis, less likely CVA  MDM:    This is an 89 year old male who presents with fever, generalized weakness, cough.  He is elderly but nontoxic-appearing.  Vital signs notable for tachycardia.  He appears to be in atrial fibrillation.  Rates fluctuate from the 110s to the 130s.  However this is in the setting of temperature of 100.7.  Patient was given 1 g of Tylenol.  O2 sats 92% on room air.  He is not in any respiratory distress.  Breath sounds are fairly clear.  Chest x-ray does not show any evidence of pneumonia.  No significant leukocytosis and metabolic panel is reassuring.  He is influenza A positive.  Attempted to ambulate the patient.  He required 1 person assist and a walker which is not his baseline.  Additionally O2 sats were maintained around 92 to 95%; his heart rate increased into the 140s.  Given his age and deconditioning likely related to influenza A as well as ongoing persistent atrial fibrillation, will admit for further management.  He was given a dose of Tamiflu.  I gave him a small fluid bolus as well as a dose of IV metoprolol.  Resume a.m. medications  after consultation with the hospitalist.  (Labs, imaging, consults)  Labs: I Ordered, and personally interpreted labs.  The pertinent results include: CBC, CMP, respiratory viral panel  Imaging Studies ordered: I ordered imaging studies including chest x-ray I independently visualized and interpreted imaging. I agree with the radiologist interpretation  Additional history obtained from wife at bedside.  External records from outside source obtained and reviewed including prior evaluations and cardiology notes  Cardiac Monitoring: The patient was maintained on a cardiac monitor.  I personally viewed and interpreted the cardiac monitored which showed an underlying rhythm of: Atrial fibrillation  Reevaluation: After the interventions noted above, I reevaluated the patient and found that they have :stayed the same  Social Determinants of Health:  lives independently with wife  Disposition: Admit  Co morbidities that complicate the patient evaluation  Past Medical History:  Diagnosis Date   Atrial fibrillation ([place])    CAD (coronary artery disease)    Mild-moderate LM with occluded RCA (collaterals) - managed medically 2013   GERD (gastroesophageal reflux disease)    History of cardioversion    4-5 years ago   Hypertensive heart disease    Obesity (BMI 30.0-34.9)    Old inferior myocardial infarction 1995   PAF (paroxysmal atrial fibrillation) ([place])    Pulmonary embolus ([place])    [name] 2015     Medicines Meds ordered this encounter  Medications   acetaminophen (TYLENOL) tablet 1,000 mg   oseltamivir (TAMIFLU) capsule 75 mg   sodium chloride 0.9 % bolus 500 mL   metoprolol tartrate (LOPRESSOR) injection 5 mg    I have reviewed the patients home medicines and have made adjustments as needed  Problem List / ED Course: Problem List Items Addressed This Visit   None Visit Diagnoses     Influenza    -  Primary   Relevant Medications   oseltamivir (TAMIFLU) capsule  75 mg (Start on 03/13/2022  4:15 AM)   Atrial fibrillation with RVR ([place])  Relevant Medications   metoprolol tartrate (LOPRESSOR) injection 5 mg (Start on 03/13/2022  4:15 AM)                   Final Clinical Impression(s) / ED Diagnoses Final diagnoses:  None    Rx / DC Orders ED Discharge Orders     None         Merryl Hacker, MD 03/13/22 [phone]

## 2022-03-13 NOTE — Progress Notes (Deleted)
   03/13/22 1500  Assess: MEWS Score  Temp (!) 101.5 F (38.6 C)  BP 138/74  MAP (mmHg) 92  Pulse Rate 95  Resp (!) 21  Level of Consciousness Alert  SpO2 92 %  O2 Device Room Air  Assess: MEWS Score  MEWS Temp 2  MEWS Systolic 0  MEWS Pulse 0  MEWS RR 1  MEWS LOC 0  MEWS Score 3  MEWS Score Color Yellow  Treat  Pain Scale 0-10  Pain Score 0  Notify: Charge Nurse/RN  Name of Charge Nurse/RN Notified Scherrie November, RN  Date Charge Nurse/RN Notified 03/13/22  Time Charge Nurse/RN Notified 1601  Provider Notification  Provider Name/Title Dr. Wynetta Emery, MD  Date Provider Notified 03/13/22  Time Provider Notified 90  Method of Notification Page  Notification Reason Change in status  Provider response No new orders  Date of Provider Response 03/13/22  Time of Provider Response 1601  Document  Patient Outcome Stabilized after interventions  Assess: SIRS CRITERIA  SIRS Temperature  1  SIRS Pulse 1  SIRS Respirations  1  SIRS WBC 0  SIRS Score Sum  3   Tylenol given for temp

## 2022-03-13 NOTE — H&P (Signed)
History and Physical    Patient: Richard [name] YYQ:[id number] DOB: [birth year] DOA: 03/13/2022 DOS: the patient was seen and examined on 03/13/2022 PCP: [facility]  Patient coming from: Home  Chief Complaint:  Chief Complaint  Patient presents with   Weakness   HPI: Richard [name] is a 89 y.o. male with medical history significant of atrial fibrillation, coronary artery disease, GERD, hypertensive heart disease, paroxysmal atrial fibrillation, history of pulmonary embolism, and more presents the ED with a chief complaint of generalized weakness.  Patient reports he was so weak in the day of presentation that he could not stand up.  Wife at bedside reports that he started having weakness on 1 January.  She reports he had family over for the holiday and they had to help him get up off the couch.  Today he physically could not get up off the commode and had to call his wife and to help him.  At baseline he walks without a cane or walker-totally independent.  Today once he did get him up he used a walker, but then ended up calling EMS because they did not feel safe getting him to the vehicle.  Patient reports she has had a dry cough and fever.  Patient reports a Tmax of 101.  He denies chest pain, dyspnea, nausea, vomiting, diarrhea.  His wife was sick before him and is the only sick contact they know of.  He complained of rhinorrhea and sore throat couple days ago but that is not bothering him now.  Patient has no other complaints at this time.  Patient does not smoke, does not drink, does not use illicit drugs.  He is vaccinated for COVID and flu.  Patient is full code. Review of Systems: As mentioned in the history of present illness. All other systems reviewed and are negative. Past Medical History:  Diagnosis Date   Atrial fibrillation ([place])    CAD (coronary artery disease)    Mild-moderate LM with occluded RCA (collaterals) - managed medically 2013   GERD  (gastroesophageal reflux disease)    History of cardioversion    4-5 years ago   Hypertensive heart disease    Obesity (BMI 30.0-34.9)    Old inferior myocardial infarction 1995   PAF (paroxysmal atrial fibrillation) ([place])    Pulmonary embolus ([place])    [name] 2015   Past Surgical History:  Procedure Laterality Date   ACHILLES TENDON REPAIR     APPENDECTOMY     LEFT HEART CATHETERIZATION WITH CORONARY ANGIOGRAM N/A 01/24/2012   Procedure: LEFT HEART CATHETERIZATION WITH CORONARY ANGIOGRAM;  Surgeon: Jacolyn Reedy, MD;  Location: [facility] CATH LAB;  Service: Cardiovascular;  Laterality: N/A;   SHOULDER SURGERY     Social History:  reports that he quit smoking about 29 years ago. His smoking use included cigarettes. He smoked an average of .25 packs per day. He has never used smokeless tobacco. He reports that he does not drink alcohol and does not use drugs.  Allergies  Allergen Reactions   Niaspan [Niacin Er]     flushing    Family History  Problem Relation Age of Onset   Heart failure Mother     Prior to Admission medications   Medication Sig Start Date End Date Taking? Authorizing Provider  acetaminophen (TYLENOL) 500 MG tablet Take 1,000 mg every 6 (six) hours as needed by mouth for mild pain or headache.    [provider]  atorvastatin (LIPITOR) 80 MG tablet Take 1  tablet by mouth in the evening 03/09/22   Satira Sark, MD  dapagliflozin propanediol (FARXIGA) 10 MG TABS tablet Take by mouth daily.    [provider]  diclofenac Sodium (VOLTAREN) 1 % GEL SMARTSIG:0.5 Gram(s) Topical Twice Daily 08/18/21   [provider]  diltiazem (CARDIZEM CD) 240 MG 24 hr capsule Take 1 capsule (240 mg total) by mouth daily. 05/13/17 12/02/21  Satira Sark, MD  ELIQUIS 5 MG TABS tablet TAKE 1 TABLET BY MOUTH TWICE DAILY Patient taking differently: Take 5 mg by mouth 2 (two) times daily. 01/26/18   Satira Sark, MD  furosemide (LASIX) 20 MG tablet  Take 1 tablet (20 mg total) by mouth daily as needed for edema or fluid. If gained weight greater than > 5 pounds in 24 hours 05/08/21 12/02/21  Shahmehdi, Valeria Batman, MD  gabapentin (NEURONTIN) 300 MG capsule Take 300 mg by mouth 2 (two) times daily. 10/20/17   [provider]  isosorbide mononitrate (IMDUR) 30 MG 24 hr tablet Take 1.5 tablets (45 mg total) by mouth daily. 07/14/21   Satira Sark, MD  losartan (COZAAR) 50 MG tablet Take 1 tablet by mouth once daily 06/01/21   Satira Sark, MD  metFORMIN (GLUCOPHAGE-XR) 500 MG 24 hr tablet Take 500 mg by mouth 2 (two) times daily.     [provider]  metoprolol succinate (TOPROL-XL) 50 MG 24 hr tablet Take 1 tablet (50 mg total) by mouth daily. Take with or immediately following a meal. 05/09/21 12/02/21  Shahmehdi, Valeria Batman, MD  nitroGLYCERIN (NITROSTAT) 0.4 MG SL tablet DISSOLVE 1 TABLET UNDER THE TONGUE EVERY 5 MINUTES AS NEEDED FOR CHEST PAIN. DO NOT EXCEED A TOTAL OF 3 DOSES IN 15 MINUTES. Patient taking differently: Place 0.4 mg under the tongue every 5 (five) minutes as needed for chest pain. 05/19/20   Satira Sark, MD    Physical Exam: Vitals:   03/13/22 0330 03/13/22 0401 03/13/22 0500 03/13/22 0544  BP: 124/87 123/77 122/77 129/86  Pulse: (!) 107 (!) 124 71 98  Resp: (!) 22 (!) 24 (!) 22 18  Temp:   99 F (37.2 C) 98.7 F (37.1 C)  TempSrc:   Oral Oral  SpO2: 92% 93% 92% 94%  Weight:      Height:       1.  General: Patient lying supine in bed,  no acute distress   2. Psychiatric: Alert and oriented x 3, mood and behavior normal for situation, pleasant and cooperative with exam   3. Neurologic: Speech and language are normal, face is symmetric, moves all 4 extremities voluntarily, at baseline without acute deficits on limited exam   4. HEENMT:  Head is atraumatic, normocephalic, pupils reactive to light, neck is supple, trachea is midline, mucous membranes are moist   5. Respiratory : Lungs are  clear to auscultation bilaterally without wheezing, rhonchi, rales, no cyanosis, no increase in work of breathing or accessory muscle use, wet sounding dry cough observed   6. Cardiovascular : Heart rate normal, rhythm is irregularly irregular, no murmurs, rubs or gallops, no peripheral edema, peripheral pulses palpated   7. Gastrointestinal:  Abdomen is soft, nondistended, nontender to palpation bowel sounds active, no masses or organomegaly palpated   8. Skin:  Skin is warm, dry and intact without rashes, acute lesions, or ulcers on limited exam   9.Musculoskeletal:  No acute deformities or trauma, no asymmetry in tone, no peripheral edema, peripheral pulses palpated, no tenderness to  palpation in the extremities  Data Reviewed: In the ED Temp 99, heart rate 107-124, respiratory rate 22-27, blood pressure 123/77-146/101, satting 91-93% on room air No leukocytosis with a white blood cell count 7.6, hemoglobin 13.1 History is unremarkable Flu a positive Chest x-ray shows no active disease Patient was ambulated in the ER in preparation to discharge home, but his heart rate went up to 140 and he became very weak Admission for observation with PT evaluation and supportive care requested  Assessment and Plan: * Influenza A - Flu positive - Symptomatic since January 1 - Generalized weakness, cough, fever - Tamiflu started in the ED - Encourage p.o. fluid intake - Continue supportive care  Hyperlipidemia - Continue statin  A-fib ([place]) - Rate 107-124 in the ED - Currently in atrial fibrillation - Continue Eliquis - Continue Cardizem - Monitor on telemetry  DM (diabetes mellitus) ([place]) - Hold metformin - Continue Farxiga - Sliding scale coverage  CAD (coronary artery disease) - Continue statin, Imdur, ARB      Advance Care Planning:   Code Status: Full Code  Consults: PT eval and treat  Family Communication: Wife at bedside  Severity of Illness: The appropriate  patient status for this patient is OBSERVATION. Observation status is judged to be reasonable and necessary in order to provide the required intensity of service to ensure the patient's safety. The patient's presenting symptoms, physical exam findings, and initial radiographic and laboratory data in the context of their medical condition is felt to place them at decreased risk for further clinical deterioration. Furthermore, it is anticipated that the patient will be medically stable for discharge from the hospital within 2 midnights of admission.   Author: Rolla Plate, DO 03/13/2022 6:03 AM  For on call review [email or web address].

## 2022-03-14 DIAGNOSIS — I4821 Permanent atrial fibrillation: Secondary | ICD-10-CM | POA: Diagnosis present

## 2022-03-14 DIAGNOSIS — J101 Influenza due to other identified influenza virus with other respiratory manifestations: Secondary | ICD-10-CM | POA: Diagnosis present

## 2022-03-14 DIAGNOSIS — Z7901 Long term (current) use of anticoagulants: Secondary | ICD-10-CM | POA: Diagnosis not present

## 2022-03-14 DIAGNOSIS — E876 Hypokalemia: Secondary | ICD-10-CM | POA: Diagnosis present

## 2022-03-14 DIAGNOSIS — K219 Gastro-esophageal reflux disease without esophagitis: Secondary | ICD-10-CM | POA: Diagnosis present

## 2022-03-14 DIAGNOSIS — Z79899 Other long term (current) drug therapy: Secondary | ICD-10-CM | POA: Diagnosis not present

## 2022-03-14 DIAGNOSIS — Z8249 Family history of ischemic heart disease and other diseases of the circulatory system: Secondary | ICD-10-CM | POA: Diagnosis not present

## 2022-03-14 DIAGNOSIS — I2510 Atherosclerotic heart disease of native coronary artery without angina pectoris: Secondary | ICD-10-CM | POA: Diagnosis present

## 2022-03-14 DIAGNOSIS — Z888 Allergy status to other drugs, medicaments and biological substances status: Secondary | ICD-10-CM | POA: Diagnosis not present

## 2022-03-14 DIAGNOSIS — Z86711 Personal history of pulmonary embolism: Secondary | ICD-10-CM | POA: Diagnosis not present

## 2022-03-14 DIAGNOSIS — I119 Hypertensive heart disease without heart failure: Secondary | ICD-10-CM | POA: Diagnosis present

## 2022-03-14 DIAGNOSIS — E119 Type 2 diabetes mellitus without complications: Secondary | ICD-10-CM | POA: Diagnosis present

## 2022-03-14 DIAGNOSIS — E785 Hyperlipidemia, unspecified: Secondary | ICD-10-CM | POA: Diagnosis present

## 2022-03-14 DIAGNOSIS — I4811 Longstanding persistent atrial fibrillation: Secondary | ICD-10-CM | POA: Diagnosis not present

## 2022-03-14 DIAGNOSIS — Z7984 Long term (current) use of oral hypoglycemic drugs: Secondary | ICD-10-CM | POA: Diagnosis not present

## 2022-03-14 DIAGNOSIS — R531 Weakness: Secondary | ICD-10-CM | POA: Diagnosis present

## 2022-03-14 DIAGNOSIS — Z1152 Encounter for screening for COVID-19: Secondary | ICD-10-CM | POA: Diagnosis not present

## 2022-03-14 DIAGNOSIS — Z87891 Personal history of nicotine dependence: Secondary | ICD-10-CM | POA: Diagnosis not present

## 2022-03-14 DIAGNOSIS — I252 Old myocardial infarction: Secondary | ICD-10-CM | POA: Diagnosis not present

## 2022-03-14 DIAGNOSIS — I25119 Atherosclerotic heart disease of native coronary artery with unspecified angina pectoris: Secondary | ICD-10-CM | POA: Diagnosis not present

## 2022-03-14 LAB — CBC WITH DIFFERENTIAL/PLATELET
Abs Immature Granulocytes: 0.04 10*3/uL (ref 0.00–0.07)
Basophils Absolute: 0 10*3/uL (ref 0.0–0.1)
Basophils Relative: 0 %
Eosinophils Absolute: 0 10*3/uL (ref 0.0–0.5)
Eosinophils Relative: 0 %
HCT: 38.9 % — ABNORMAL LOW (ref 39.0–52.0)
Hemoglobin: 12.5 g/dL — ABNORMAL LOW (ref 13.0–17.0)
Immature Granulocytes: 1 %
Lymphocytes Relative: 12 %
Lymphs Abs: 0.9 10*3/uL (ref 0.7–4.0)
MCH: 30.8 pg (ref 26.0–34.0)
MCHC: 32.1 g/dL (ref 30.0–36.0)
MCV: 95.8 fL (ref 80.0–100.0)
Monocytes Absolute: 0.8 10*3/uL (ref 0.1–1.0)
Monocytes Relative: 10 %
Neutro Abs: 6.1 10*3/uL (ref 1.7–7.7)
Neutrophils Relative %: 77 %
Platelets: 111 10*3/uL — ABNORMAL LOW (ref 150–400)
RBC: 4.06 MIL/uL — ABNORMAL LOW (ref 4.22–5.81)
RDW: 15.9 % — ABNORMAL HIGH (ref 11.5–15.5)
WBC: 8 10*3/uL (ref 4.0–10.5)
nRBC: 0 % (ref 0.0–0.2)

## 2022-03-14 LAB — COMPREHENSIVE METABOLIC PANEL
ALT: 18 U/L (ref 0–44)
AST: 25 U/L (ref 15–41)
Albumin: 3.3 g/dL — ABNORMAL LOW (ref 3.5–5.0)
Alkaline Phosphatase: 48 U/L (ref 38–126)
Anion gap: 11 (ref 5–15)
BUN: 13 mg/dL (ref 8–23)
CO2: 23 mmol/L (ref 22–32)
Calcium: 7.8 mg/dL — ABNORMAL LOW (ref 8.9–10.3)
Chloride: 103 mmol/L (ref 98–111)
Creatinine, Ser: 0.71 mg/dL (ref 0.61–1.24)
GFR, Estimated: >60 mL/min (ref >60)
Glucose, Bld: 109 mg/dL — ABNORMAL HIGH (ref 70–99)
Potassium: 3.2 mmol/L — ABNORMAL LOW (ref 3.5–5.1)
Sodium: 137 mmol/L (ref 135–145)
Total Bilirubin: 1 mg/dL (ref 0.3–1.2)
Total Protein: 6 g/dL — ABNORMAL LOW (ref 6.5–8.1)

## 2022-03-14 LAB — GLUCOSE, CAPILLARY
Glucose-Capillary: 113 mg/dL — ABNORMAL HIGH (ref 70–99)
Glucose-Capillary: 181 mg/dL — ABNORMAL HIGH (ref 70–99)
Glucose-Capillary: 167 mg/dL — ABNORMAL HIGH (ref 70–99)
Glucose-Capillary: 140 mg/dL — ABNORMAL HIGH (ref 70–99)

## 2022-03-14 LAB — PROCALCITONIN: Procalcitonin: <0.1 ng/mL

## 2022-03-14 LAB — MAGNESIUM: Magnesium: 1.4 mg/dL — ABNORMAL LOW (ref 1.7–2.4)

## 2022-03-14 MED ORDER — POTASSIUM CHLORIDE CRYS ER 20 MEQ PO TBCR
40.0000 meq | EXTENDED_RELEASE_TABLET | Freq: Once | ORAL | Status: AC
  Administered 2022-03-14: 40 meq via ORAL
  Filled 2022-03-14: qty 2

## 2022-03-14 MED ORDER — ACETAMINOPHEN 325 MG PO TABS
650.0000 mg | ORAL_TABLET | Freq: Four times a day (QID) | ORAL | Status: DC
  Administered 2022-03-14 – 2022-03-15 (×2): 650 mg via ORAL
  Filled 2022-03-14 (×2): qty 2

## 2022-03-14 MED ORDER — GUAIFENESIN ER 600 MG PO TB12
600.0000 mg | ORAL_TABLET | Freq: Two times a day (BID) | ORAL | Status: DC
  Administered 2022-03-14 – 2022-03-15 (×2): 600 mg via ORAL
  Filled 2022-03-14 (×2): qty 1

## 2022-03-14 MED ORDER — ACETAMINOPHEN 650 MG RE SUPP: 650.0000 mg | Freq: Four times a day (QID) | RECTAL | Status: DC

## 2022-03-14 MED ORDER — MAGNESIUM SULFATE 4 GM/100ML IV SOLN
4.0000 g | Freq: Once | INTRAVENOUS | Status: AC
  Administered 2022-03-14: 4 g via INTRAVENOUS
  Filled 2022-03-14: qty 100

## 2022-03-14 NOTE — Progress Notes (Signed)
Spo2 85% room before treatment o2 applied @ 2lpm cann nurse informed

## 2022-03-14 NOTE — Plan of Care (Signed)
  Problem: Education: Goal: Knowledge of General Education information will improve Description Including pain rating scale, medication(s)/side effects and non-pharmacologic comfort measures Outcome: Progressing   

## 2022-03-14 NOTE — Progress Notes (Signed)
Upon entering room, audible wheezing noted. Patient does not appear to be in distress. Respiratory was called for a neb treatment. Richard [name]

## 2022-03-14 NOTE — Progress Notes (Signed)
PROGRESS NOTE   Richard [name]  YPP:[id number] DOB: 22-Dec-1933 DOA: 03/13/2022 PCP: [facility]   Chief Complaint  Patient presents with   Weakness   Level of care: Med-Surg  Brief Admission History:  89 y.o. male with medical history significant of atrial fibrillation, coronary artery disease, GERD, hypertensive heart disease, paroxysmal atrial fibrillation, history of pulmonary embolism, and more presents the ED with a chief complaint of generalized weakness.  Patient reports he was so weak in the day of presentation that he could not stand up.  Wife at bedside reports that he started having weakness on 1 January.  She reports he had family over for the holiday and they had to help him get up off the couch.  Today he physically could not get up off the commode and had to call his wife and to help him.  At baseline he walks without a cane or walker-totally independent.  Today once he did get him up he used a walker, but then ended up calling EMS because they did not feel safe getting him to the vehicle.  Patient reports she has had a dry cough and fever.  Patient reports a Tmax of 101.  He denies chest pain, dyspnea, nausea, vomiting, diarrhea.  His wife was sick before him and is the only sick contact they know of.  He complained of rhinorrhea and sore throat couple days ago but that is not bothering him now.  Patient has no other complaints at this time.   Patient does not smoke, does not drink, does not use illicit drugs.  He is vaccinated for COVID and flu.  Patient is full code.   Assessment and Plan: * Influenza A - Flu positive on admission - Symptomatic since January 1 - Generalized weakness, cough, fever - Tamiflu course to be completed - Encourage p.o. fluid intake - Continue supportive care  Hypokalemia --replacing magnesium as ordered --oral replacement ordered, recheck in AM   Hypomagnesemia --IV replacement ordered, recheck in AM   A-fib ([place]) - Rate  much improved now - Currently in atrial fibrillation - Continue Eliquis - Continue Cardizem - Monitor on telemetry  DM (diabetes mellitus) ([place]) - Hold metformin - Continue Farxiga - Sliding scale coverage CBG (last 3)  Recent Labs    03/13/22 2111 03/14/22 0747 03/14/22 1144  GLUCAP 181* 113* 181*     Hyperlipidemia - Continue statin  CAD (coronary artery disease) - Continue statin, Imdur, ARB  DVT prophylaxis: apixaban Code Status: full  Family Communication: wife updated bedside 1/7 Disposition: Status is: Inpatient Remains inpatient appropriate because: IV fluids, intensity of illness    Consultants:   Procedures:   Antimicrobials:  tamiflu   Subjective: Pt reports that he has malaise and headache; cough that is nonproductive.   Objective: Vitals:   03/14/22 0411 03/14/22 0456 03/14/22 1314 03/14/22 1500  BP:  139/76 91/63 (!) 88/58  Pulse:  95 96 76  Resp:  '20 16 16  '$ Temp:  99.2 F (37.3 C) 98.3 F (36.8 C) 98.3 F (36.8 C)  TempSrc:   Oral Oral  SpO2: (!) 85% 97% 98% 94%  Weight:      Height:        Intake/Output Summary (Last 24 hours) at 03/14/2022 1521 Last data filed at 03/14/2022 0500 Gross per 24 hour  Intake 1028.63 ml  Output --  Net 1028.63 ml   Filed Weights   03/13/22 0138  Weight: 81.2 kg   Examination:  General exam: Appears  calm and comfortable, afebrile;   Respiratory system: no increased work of breathing.  Cardiovascular system: normal S1 & S2 heard. No JVD, murmurs, rubs, gallops or clicks. No pedal edema. Gastrointestinal system: Abdomen is nondistended, soft and nontender. No organomegaly or masses felt. Normal bowel sounds heard. Central nervous system: Alert and oriented. No focal neurological deficits. Extremities: Symmetric 5 x 5 power. Skin: No rashes, lesions or ulcers. Psychiatry: Judgement and insight appear normal. Mood & affect appropriate.   Data Reviewed: I have personally reviewed following labs and  imaging studies  CBC: Recent Labs  Lab 03/13/22 0215 03/14/22 0544  WBC 7.6 8.0  NEUTROABS 5.9 6.1  HGB 13.1 12.5*  HCT 40.5 38.9*  MCV 95.3 95.8  PLT 118* 111*    Basic Metabolic Panel: Recent Labs  Lab 03/13/22 0215 03/14/22 0544  NA 137 137  K 3.5 3.2*  CL 102 103  CO2 25 23  GLUCOSE 113* 109*  BUN 15 13  CREATININE 0.78 0.71  CALCIUM 8.1* 7.8*  MG  --  1.4*    CBG: Recent Labs  Lab 03/13/22 1142 03/13/22 1653 03/13/22 2111 03/14/22 0747 03/14/22 1144  GLUCAP 122* 117* 181* 113* 181*    Recent Results (from the past 240 hour(s))  Resp panel by RT-PCR (RSV, Flu A&B, Covid) Anterior Nasal Swab     Status: Abnormal   Collection Time: 03/13/22  2:10 AM   Specimen: Anterior Nasal Swab  Result Value Ref Range Status   SARS Coronavirus 2 by RT PCR NEGATIVE NEGATIVE Final    Comment: (NOTE) SARS-CoV-2 target nucleic acids are NOT DETECTED.  The SARS-CoV-2 RNA is generally detectable in upper respiratory specimens during the acute phase of infection. The lowest concentration of SARS-CoV-2 viral copies this assay can detect is 138 copies/mL. A negative result does not preclude SARS-Cov-2 infection and should not be used as the sole basis for treatment or other patient management decisions. A negative result may occur with  improper specimen collection/handling, submission of specimen other than nasopharyngeal swab, presence of viral mutation(s) within the areas targeted by this assay, and inadequate number of viral copies(<138 copies/mL). A negative result must be combined with clinical observations, patient history, and epidemiological information. The expected result is Negative.  Fact Sheet for Patients:  EntrepreneurPulse.com.au  Fact Sheet for Healthcare Providers:  IncredibleEmployment.be  This test is no t yet approved or cleared by the [place] FDA and  has been authorized for detection and/or diagnosis of  SARS-CoV-2 by FDA under an Emergency Use Authorization (EUA). This EUA will remain  in effect (meaning this test can be used) for the duration of the COVID-19 declaration under Section 564(b)(1) of the Act, 21 U.S.C.section 360bbb-3(b)(1), unless the authorization is terminated  or revoked sooner.       Influenza A by PCR POSITIVE (A) NEGATIVE Final   Influenza B by PCR NEGATIVE NEGATIVE Final    Comment: (NOTE) The Xpert Xpress SARS-CoV-2/FLU/RSV plus assay is intended as an aid in the diagnosis of influenza from Nasopharyngeal swab specimens and should not be used as a sole basis for treatment. Nasal washings and aspirates are unacceptable for Xpert Xpress SARS-CoV-2/FLU/RSV testing.  Fact Sheet for Patients: EntrepreneurPulse.com.au  Fact Sheet for Healthcare Providers: IncredibleEmployment.be  This test is not yet approved or cleared by the [place] FDA and has been authorized for detection and/or diagnosis of SARS-CoV-2 by FDA under an Emergency Use Authorization (EUA). This EUA will remain in effect (meaning this test can be used)  for the duration of the COVID-19 declaration under Section 564(b)(1) of the Act, 21 U.S.C. section 360bbb-3(b)(1), unless the authorization is terminated or revoked.     Resp Syncytial Virus by PCR NEGATIVE NEGATIVE Final    Comment: (NOTE) Fact Sheet for Patients: EntrepreneurPulse.com.au  Fact Sheet for Healthcare Providers: IncredibleEmployment.be  This test is not yet approved or cleared by the [place] FDA and has been authorized for detection and/or diagnosis of SARS-CoV-2 by FDA under an Emergency Use Authorization (EUA). This EUA will remain in effect (meaning this test can be used) for the duration of the COVID-19 declaration under Section 564(b)(1) of the Act, 21 U.S.C. section 360bbb-3(b)(1), unless the authorization is terminated  or revoked.  Performed at [facility], [address] [place], [place] [address]   Culture, blood (Routine X 2) w Reflex to ID Panel     Status: None (Preliminary result)   Collection Time: 03/13/22  4:27 PM   Specimen: BLOOD  Result Value Ref Range Status   Specimen Description BLOOD BLOOD LEFT HAND  Final   Special Requests   Final    BOTTLES DRAWN AEROBIC AND ANAEROBIC Blood Culture adequate volume   Culture   Final    NO GROWTH < 12 HOURS Performed at [facility], [address], [place], [place] [address]    Report Status PENDING  Incomplete  Culture, blood (Routine X 2) w Reflex to ID Panel     Status: None (Preliminary result)   Collection Time: 03/13/22  4:28 PM   Specimen: BLOOD  Result Value Ref Range Status   Specimen Description BLOOD RIGHT ANTECUBITAL  Final   Special Requests   Final    BOTTLES DRAWN AEROBIC AND ANAEROBIC Blood Culture adequate volume   Culture   Final    NO GROWTH < 12 HOURS Performed at [facility], [address] [place], [place] [address]    Report Status PENDING  Incomplete     Radiology Studies: DG Chest Portable 1 View  Result Date: 03/13/2022 CLINICAL DATA:  Cough, fever. EXAM: PORTABLE CHEST 1 VIEW COMPARISON:  05/07/2021. FINDINGS: The heart is enlarged and the mediastinal contour is stable. Lung volumes are low. No consolidation, effusion, or pneumothorax. No acute osseous abnormality. IMPRESSION: 1. No active disease. 2. Cardiomegaly. Electronically Signed   By: Brett Fairy M.D.   On: 03/13/2022 02:45    Scheduled Meds:  acetaminophen  650 mg Oral Q6H   Or   acetaminophen  650 mg Rectal Q6H   apixaban  5 mg Oral BID   atorvastatin  80 mg Oral QPM   dapagliflozin propanediol  10 mg Oral Daily   diltiazem  240 mg Oral Daily   gabapentin  300 mg Oral BID   guaiFENesin  600 mg Oral BID   insulin aspart  0-15 Units Subcutaneous TID WC   insulin aspart  0-5 Units Subcutaneous QHS   isosorbide mononitrate  30 mg Oral Daily    metoprolol succinate  50 mg Oral Daily   oseltamivir  75 mg Oral BID   Continuous Infusions:  sodium chloride 50 mL/hr at 03/14/22 0403    LOS: 0 days   Time spent: 35 mins  Maezie Justin Wynetta Emery, MD How to contact the [facility] Attending or Consulting provider [place] or covering provider during after hours [place], for this patient?  Check the care team in [facility] and look for a) attending/consulting [facility] provider listed and b) the [facility] team listed Log into [email or web address] and use  [place]'s universal password to access. If you do not have the password, please contact the hospital operator. Locate the [facility] provider you are looking for under [facility] and page to a number that you can be directly reached. If you still have difficulty reaching the provider, please page the [facility] (Director on Call) for the Hospitalists listed on amion for assistance.  03/14/2022, 3:21 PM

## 2022-03-14 NOTE — Assessment & Plan Note (Signed)
--  IV replacement ordered, recheck in AM ?

## 2022-03-14 NOTE — Progress Notes (Signed)
Notified Dr. Wynetta Emery that patient's BP was 88/58.

## 2022-03-14 NOTE — Progress Notes (Signed)
Pt received q 4 PO Tylenol and swallowed without difficulty. Pt had episode of wheezing and hypoxia ( 85% on RA) over night in which a neb treatment was administered and placed on 2 L [place]. Pt has remained in upper 90's on 2 L . He never appeared in distress. He remains alert and oriented. Bryson Corona [name]

## 2022-03-14 NOTE — Hospital Course (Signed)
89 y.o. male with medical history significant of atrial fibrillation, coronary artery disease, GERD, hypertensive heart disease, paroxysmal atrial fibrillation, history of pulmonary embolism, and more presents the ED with a chief complaint of generalized weakness.  Patient reports he was so weak in the day of presentation that he could not stand up.  Wife at bedside reports that he started having weakness on 1 January.  She reports he had family over for the holiday and they had to help him get up off the couch.  Today he physically could not get up off the commode and had to call his wife and to help him.  At baseline he walks without a cane or walker-totally independent.  Today once he did get him up he used a walker, but then ended up calling EMS because they did not feel safe getting him to the vehicle.  Patient reports she has had a dry cough and fever.  Patient reports a Tmax of 101.  He denies chest pain, dyspnea, nausea, vomiting, diarrhea.  His wife was sick before him and is the only sick contact they know of.  He complained of rhinorrhea and sore throat couple days ago but that is not bothering him now.  Patient has no other complaints at this time.   Patient does not smoke, does not drink, does not use illicit drugs.  He is vaccinated for COVID and flu.  Patient is full code.

## 2022-03-14 NOTE — Assessment & Plan Note (Addendum)
--  replacing magnesium as ordered --oral replacement ordered, recheck in AM

## 2022-03-15 DIAGNOSIS — J101 Influenza due to other identified influenza virus with other respiratory manifestations: Secondary | ICD-10-CM | POA: Diagnosis not present

## 2022-03-15 DIAGNOSIS — E119 Type 2 diabetes mellitus without complications: Secondary | ICD-10-CM | POA: Diagnosis not present

## 2022-03-15 DIAGNOSIS — I4811 Longstanding persistent atrial fibrillation: Secondary | ICD-10-CM | POA: Diagnosis not present

## 2022-03-15 LAB — BASIC METABOLIC PANEL
Anion gap: 8 (ref 5–15)
BUN: 13 mg/dL (ref 8–23)
CO2: 23 mmol/L (ref 22–32)
Calcium: 7.7 mg/dL — ABNORMAL LOW (ref 8.9–10.3)
Chloride: 105 mmol/L (ref 98–111)
Creatinine, Ser: 0.76 mg/dL (ref 0.61–1.24)
GFR, Estimated: >60 mL/min (ref >60)
Glucose, Bld: 118 mg/dL — ABNORMAL HIGH (ref 70–99)
Potassium: 3 mmol/L — ABNORMAL LOW (ref 3.5–5.1)
Sodium: 136 mmol/L (ref 135–145)

## 2022-03-15 LAB — MAGNESIUM: Magnesium: 2.2 mg/dL (ref 1.7–2.4)

## 2022-03-15 LAB — HEMOGLOBIN A1C
Hgb A1c MFr Bld: 6.7 % — ABNORMAL HIGH (ref 4.8–5.6)
Mean Plasma Glucose: 146 mg/dL

## 2022-03-15 LAB — GLUCOSE, CAPILLARY: Glucose-Capillary: 162 mg/dL — ABNORMAL HIGH (ref 70–99)

## 2022-03-15 MED ORDER — OSELTAMIVIR PHOSPHATE 75 MG PO CAPS: 75.0000 mg | ORAL_CAPSULE | Freq: Two times a day (BID) | ORAL | 0 refills | Status: AC

## 2022-03-15 MED ORDER — ALBUTEROL SULFATE HFA 108 (90 BASE) MCG/ACT IN AERS: 2.0000 | INHALATION_SPRAY | RESPIRATORY_TRACT | 1 refills | Status: AC | PRN

## 2022-03-15 MED ORDER — POTASSIUM CHLORIDE CRYS ER 20 MEQ PO TBCR
40.0000 meq | EXTENDED_RELEASE_TABLET | Freq: Once | ORAL | Status: AC
  Administered 2022-03-15: 40 meq via ORAL
  Filled 2022-03-15: qty 2

## 2022-03-15 MED ORDER — GUAIFENESIN ER 600 MG PO TB12: 600.0000 mg | ORAL_TABLET | Freq: Two times a day (BID) | ORAL | 0 refills | Status: AC

## 2022-03-15 MED ORDER — DEXTROMETHORPHAN POLISTIREX ER 30 MG/5ML PO SUER: 30.0000 mg | Freq: Two times a day (BID) | ORAL | 0 refills | Status: DC | PRN

## 2022-03-15 NOTE — Discharge Summary (Signed)
Physician Discharge Summary  KASTEN LEVEQUE MVH:[id number] DOB: 1933/05/04 DOA: 03/13/2022  PCP: [facility]  Admit date: 03/13/2022 Discharge date: 03/15/2022  Admitted From:  HOME  Disposition: [place]  Recommendations for Outpatient Follow-up:  Follow up with PCP in 1-2 weeks Please check bmp in 1-2 weeks to follow up potassium  Home Health:  PT  Discharge Condition: STABLE   CODE STATUS: FULL DIET: resume previous home diet    Brief Hospitalization Summary: Please see all hospital notes, images, labs for full details of the hospitalization. ADMISSION HPI:  89 y.o. male with medical history significant of atrial fibrillation, coronary artery disease, GERD, hypertensive heart disease, paroxysmal atrial fibrillation, history of pulmonary embolism, and more presents the ED with a chief complaint of generalized weakness.  Patient reports he was so weak in the day of presentation that he could not stand up.  Wife at bedside reports that he started having weakness on 1 January.  She reports he had family over for the holiday and they had to help him get up off the couch.  Today he physically could not get up off the commode and had to call his wife and to help him.  At baseline he walks without a cane or walker-totally independent.  Today once he did get him up he used a walker, but then ended up calling EMS because they did not feel safe getting him to the vehicle.  Patient reports she has had a dry cough and fever.  Patient reports a Tmax of 101.  He denies chest pain, dyspnea, nausea, vomiting, diarrhea.  His wife was sick before him and is the only sick contact they know of.  He complained of rhinorrhea and sore throat couple days ago but that is not bothering him now.  Patient has no other complaints at this time.   Patient does not smoke, does not drink, does not use illicit drugs.  He is vaccinated for COVID and flu.  Patient is full code.   Assessment and Plan: *  Influenza A - Flu positive on admission - Symptomatic since January 1 - Generalized weakness, cough, fever - Tamiflu course to be completed - Encourage p.o. fluid intake - responded well to supportive care; DC home today;    Hypokalemia --replacing magnesium as ordered --oral replacement ordered prior to discharge   Hypomagnesemia --IV replacement ordered and repleted   A-fib ([place]) - Rate much improved now - Currently in atrial fibrillation - Continue Eliquis - Continue Cardizem - Monitor on telemetry   DM (diabetes mellitus) ([place]) - Hold metformin - Continue Farxiga - Sliding scale coverage in hospital; resume home treatments at discharge CBG (last 3)  Recent Labs    03/14/22 1601 03/14/22 2128 03/15/22 0817  GLUCAP 167* 140* 162*    Hyperlipidemia - Continue statin   CAD (coronary artery disease) - Continue statin, Imdur, ARB   Discharge Diagnoses:  Principal Problem:   Influenza A Active Problems:   CAD (coronary artery disease)   Hyperlipidemia   DM (diabetes mellitus) ([place])   A-fib (H[facility])   Hypomagnesemia   Hypokalemia   Discharge Instructions:  Allergies as of 03/15/2022       Reactions   Niaspan [niacin Er]    flushing        Medication List     TAKE these medications    acetaminophen 500 MG tablet Commonly known as: TYLENOL Take 1,000 mg every 6 (six) hours as needed by mouth for mild pain or  headache.   albuterol 108 (90 Base) MCG/ACT inhaler Commonly known as: VENTOLIN HFA Inhale 2 puffs into the lungs every 4 (four) hours as needed for wheezing or shortness of breath (cough, shortness of breath or wheezing.).   atorvastatin 80 MG tablet Commonly known as: LIPITOR Take 1 tablet by mouth in the evening   dextromethorphan 30 MG/5ML liquid Commonly known as: Delsym Take 5 mLs (30 mg total) by mouth 2 (two) times daily as needed for cough.   diclofenac Sodium 1 % Gel Commonly known as: VOLTAREN Apply 2 g topically 2 (two)  times daily as needed (pain).   diltiazem 240 MG 24 hr capsule Commonly known as: CARDIZEM CD Take 1 capsule (240 mg total) by mouth daily.   Eliquis 5 MG Tabs tablet Generic drug: apixaban TAKE 1 TABLET BY MOUTH TWICE DAILY What changed: how much to take   Farxiga 10 MG Tabs tablet Generic drug: dapagliflozin propanediol Take 10 mg by mouth daily.   gabapentin 300 MG capsule Commonly known as: NEURONTIN Take 300 mg by mouth 2 (two) times daily.   guaiFENesin 600 MG 12 hr tablet Commonly known as: MUCINEX Take 1 tablet (600 mg total) by mouth 2 (two) times daily for 5 days.   isosorbide mononitrate 30 MG 24 hr tablet Commonly known as: IMDUR Take 1.5 tablets (45 mg total) by mouth daily.   losartan 50 MG tablet Commonly known as: COZAAR Take 1 tablet by mouth once daily   metFORMIN 500 MG 24 hr tablet Commonly known as: GLUCOPHAGE-XR Take 500 mg by mouth 2 (two) times daily.   metoprolol succinate 50 MG 24 hr tablet Commonly known as: TOPROL-XL Take 1 tablet (50 mg total) by mouth daily. Take with or immediately following a meal.   nitroGLYCERIN 0.4 MG SL tablet Commonly known as: NITROSTAT DISSOLVE 1 TABLET UNDER THE TONGUE EVERY 5 MINUTES AS NEEDED FOR CHEST PAIN. DO NOT EXCEED A TOTAL OF 3 DOSES IN 15 MINUTES. What changed:  how much to take how to take this when to take this reasons to take this additional instructions   oseltamivir 75 MG capsule Commonly known as: TAMIFLU Take 1 capsule (75 mg total) by mouth 2 (two) times daily for 7 doses.   PRESERVISION AREDS 2 PO Take 2 tablets by mouth daily.        Follow-up Information     [facility]. Schedule an appointment as soon as possible for a visit in 1 week(s).   Why: Hospital Follow Up Contact information: [place] [address] [phone]                Allergies  Allergen Reactions   Niaspan Durene Cal Er]     flushing   Allergies as of 03/15/2022        Reactions   Niaspan [niacin Er]    flushing        Medication List     TAKE these medications    acetaminophen 500 MG tablet Commonly known as: TYLENOL Take 1,000 mg every 6 (six) hours as needed by mouth for mild pain or headache.   albuterol 108 (90 Base) MCG/ACT inhaler Commonly known as: VENTOLIN HFA Inhale 2 puffs into the lungs every 4 (four) hours as needed for wheezing or shortness of breath (cough, shortness of breath or wheezing.).   atorvastatin 80 MG tablet Commonly known as: LIPITOR Take 1 tablet by mouth in the evening   dextromethorphan 30 MG/5ML liquid Commonly known as:  Delsym Take 5 mLs (30 mg total) by mouth 2 (two) times daily as needed for cough.   diclofenac Sodium 1 % Gel Commonly known as: VOLTAREN Apply 2 g topically 2 (two) times daily as needed (pain).   diltiazem 240 MG 24 hr capsule Commonly known as: CARDIZEM CD Take 1 capsule (240 mg total) by mouth daily.   Eliquis 5 MG Tabs tablet Generic drug: apixaban TAKE 1 TABLET BY MOUTH TWICE DAILY What changed: how much to take   Farxiga 10 MG Tabs tablet Generic drug: dapagliflozin propanediol Take 10 mg by mouth daily.   gabapentin 300 MG capsule Commonly known as: NEURONTIN Take 300 mg by mouth 2 (two) times daily.   guaiFENesin 600 MG 12 hr tablet Commonly known as: MUCINEX Take 1 tablet (600 mg total) by mouth 2 (two) times daily for 5 days.   isosorbide mononitrate 30 MG 24 hr tablet Commonly known as: IMDUR Take 1.5 tablets (45 mg total) by mouth daily.   losartan 50 MG tablet Commonly known as: COZAAR Take 1 tablet by mouth once daily   metFORMIN 500 MG 24 hr tablet Commonly known as: GLUCOPHAGE-XR Take 500 mg by mouth 2 (two) times daily.   metoprolol succinate 50 MG 24 hr tablet Commonly known as: TOPROL-XL Take 1 tablet (50 mg total) by mouth daily. Take with or immediately following a meal.   nitroGLYCERIN 0.4 MG SL tablet Commonly known as: NITROSTAT DISSOLVE 1  TABLET UNDER THE TONGUE EVERY 5 MINUTES AS NEEDED FOR CHEST PAIN. DO NOT EXCEED A TOTAL OF 3 DOSES IN 15 MINUTES. What changed:  how much to take how to take this when to take this reasons to take this additional instructions   oseltamivir 75 MG capsule Commonly known as: TAMIFLU Take 1 capsule (75 mg total) by mouth 2 (two) times daily for 7 doses.   PRESERVISION AREDS 2 PO Take 2 tablets by mouth daily.        Procedures/Studies: DG Chest Portable 1 View  Result Date: 03/13/2022 CLINICAL DATA:  Cough, fever. EXAM: PORTABLE CHEST 1 VIEW COMPARISON:  05/07/2021. FINDINGS: The heart is enlarged and the mediastinal contour is stable. Lung volumes are low. No consolidation, effusion, or pneumothorax. No acute osseous abnormality. IMPRESSION: 1. No active disease. 2. Cardiomegaly. Electronically Signed   By: Brett Fairy M.D.   On: 03/13/2022 02:45     Subjective: Chest congestion persists but breathing better, no fever or chills; ambulating well in room.   Discharge Exam: Vitals:   03/14/22 2119 03/15/22 0444  BP: 98/74 (!) 121/90  Pulse: 94 97  Resp: 16 17  Temp: 98.6 F (37 C) 98.5 F (36.9 C)  SpO2: 96% 97%   Vitals:   03/14/22 1314 03/14/22 1500 03/14/22 2119 03/15/22 0444  BP: 91/63 (!) 88/58 98/74 (!) 121/90  Pulse: 96 76 94 97  Resp: '16 16 16 17  '$ Temp: 98.3 F (36.8 C) 98.3 F (36.8 C) 98.6 F (37 C) 98.5 F (36.9 C)  TempSrc: Oral Oral Oral Oral  SpO2: 98% 94% 96% 97%  Weight:      Height:       General exam: Appears calm and comfortable, afebrile;   Respiratory system: no increased work of breathing.  Cardiovascular system: normal S1 & S2 heard. No JVD, murmurs, rubs, gallops or clicks. No pedal edema. Gastrointestinal system: Abdomen is nondistended, soft and nontender. No organomegaly or masses felt. Normal bowel sounds heard. Central nervous system: Alert and oriented. No focal neurological  deficits. Extremities: Symmetric 5 x 5 power. Skin: No  rashes, lesions or ulcers. Psychiatry: Judgement and insight appear normal. Mood & affect appropriate.    The results of significant diagnostics from this hospitalization (including imaging, microbiology, ancillary and laboratory) are listed below for reference.     Microbiology: Recent Results (from the past 240 hour(s))  Resp panel by RT-PCR (RSV, Flu A&B, Covid) Anterior Nasal Swab     Status: Abnormal   Collection Time: 03/13/22  2:10 AM   Specimen: Anterior Nasal Swab  Result Value Ref Range Status   SARS Coronavirus 2 by RT PCR NEGATIVE NEGATIVE Final    Comment: (NOTE) SARS-CoV-2 target nucleic acids are NOT DETECTED.  The SARS-CoV-2 RNA is generally detectable in upper respiratory specimens during the acute phase of infection. The lowest concentration of SARS-CoV-2 viral copies this assay can detect is 138 copies/mL. A negative result does not preclude SARS-Cov-2 infection and should not be used as the sole basis for treatment or other patient management decisions. A negative result may occur with  improper specimen collection/handling, submission of specimen other than nasopharyngeal swab, presence of viral mutation(s) within the areas targeted by this assay, and inadequate number of viral copies(<138 copies/mL). A negative result must be combined with clinical observations, patient history, and epidemiological information. The expected result is Negative.  Fact Sheet for Patients:  EntrepreneurPulse.com.au  Fact Sheet for Healthcare Providers:  IncredibleEmployment.be  This test is no t yet approved or cleared by the [place] FDA and  has been authorized for detection and/or diagnosis of SARS-CoV-2 by FDA under an Emergency Use Authorization (EUA). This EUA will remain  in effect (meaning this test can be used) for the duration of the COVID-19 declaration under Section 564(b)(1) of the Act, 21 U.S.C.section 360bbb-3(b)(1),  unless the authorization is terminated  or revoked sooner.       Influenza A by PCR POSITIVE (A) NEGATIVE Final   Influenza B by PCR NEGATIVE NEGATIVE Final    Comment: (NOTE) The Xpert Xpress SARS-CoV-2/FLU/RSV plus assay is intended as an aid in the diagnosis of influenza from Nasopharyngeal swab specimens and should not be used as a sole basis for treatment. Nasal washings and aspirates are unacceptable for Xpert Xpress SARS-CoV-2/FLU/RSV testing.  Fact Sheet for Patients: EntrepreneurPulse.com.au  Fact Sheet for Healthcare Providers: IncredibleEmployment.be  This test is not yet approved or cleared by the [place] FDA and has been authorized for detection and/or diagnosis of SARS-CoV-2 by FDA under an Emergency Use Authorization (EUA). This EUA will remain in effect (meaning this test can be used) for the duration of the COVID-19 declaration under Section 564(b)(1) of the Act, 21 U.S.C. section 360bbb-3(b)(1), unless the authorization is terminated or revoked.     Resp Syncytial Virus by PCR NEGATIVE NEGATIVE Final    Comment: (NOTE) Fact Sheet for Patients: EntrepreneurPulse.com.au  Fact Sheet for Healthcare Providers: IncredibleEmployment.be  This test is not yet approved or cleared by the [place] FDA and has been authorized for detection and/or diagnosis of SARS-CoV-2 by FDA under an Emergency Use Authorization (EUA). This EUA will remain in effect (meaning this test can be used) for the duration of the COVID-19 declaration under Section 564(b)(1) of the Act, 21 U.S.C. section 360bbb-3(b)(1), unless the authorization is terminated or revoked.  Performed at [facility], [address], [place], [place] [address]   Culture, blood (Routine X 2) w Reflex to ID Panel     Status: None (Preliminary result)   Collection Time:  03/13/22  4:27 PM   Specimen: BLOOD  Result Value Ref Range  Status   Specimen Description BLOOD BLOOD LEFT HAND  Final   Special Requests   Final    BOTTLES DRAWN AEROBIC AND ANAEROBIC Blood Culture adequate volume   Culture   Final    NO GROWTH 2 DAYS Performed at [facility], [address], [place], [place] [address]    Report Status PENDING  Incomplete  Culture, blood (Routine X 2) w Reflex to ID Panel     Status: None (Preliminary result)   Collection Time: 03/13/22  4:28 PM   Specimen: BLOOD  Result Value Ref Range Status   Specimen Description BLOOD RIGHT ANTECUBITAL  Final   Special Requests   Final    BOTTLES DRAWN AEROBIC AND ANAEROBIC Blood Culture adequate volume   Culture   Final    NO GROWTH 2 DAYS Performed at [facility], [address], [place], [place] [address]    Report Status PENDING  Incomplete     Labs: BNP (last 3 results) Recent Labs    05/08/21 0602  BNP 160.7*   Basic Metabolic Panel: Recent Labs  Lab 03/13/22 0215 03/14/22 0544 03/15/22 0533  NA 137 137 136  K 3.5 3.2* 3.0*  CL 102 103 105  CO2 '25 23 23  '$ GLUCOSE 113* 109* 118*  BUN '15 13 13  '$ CREATININE 0.78 0.71 0.76  CALCIUM 8.1* 7.8* 7.7*  MG  --  1.4* 2.2   Liver Function Tests: Recent Labs  Lab 03/13/22 0215 03/14/22 0544  AST 27 25  ALT 19 18  ALKPHOS 61 48  BILITOT 1.2 1.0  PROT 6.9 6.0*  ALBUMIN 3.9 3.3*   No results for input(s): "LIPASE", "AMYLASE" in the last 168 hours. No results for input(s): "AMMONIA" in the last 168 hours. CBC: Recent Labs  Lab 03/13/22 0215 03/14/22 0544  WBC 7.6 8.0  NEUTROABS 5.9 6.1  HGB 13.1 12.5*  HCT 40.5 38.9*  MCV 95.3 95.8  PLT 118* 111*   Cardiac Enzymes: No results for input(s): "CKTOTAL", "CKMB", "CKMBINDEX", "TROPONINI" in the last 168 hours. BNP: Invalid input(s): "POCBNP" CBG: Recent Labs  Lab 03/14/22 0747 03/14/22 1144 03/14/22 1601 03/14/22 2128 03/15/22 0817  GLUCAP 113* 181* 167* 140* 162*   D-Dimer No results for input(s): "DDIMER" in the last 72 hours. Hgb  A1c No results for input(s): "HGBA1C" in the last 72 hours. Lipid Profile No results for input(s): "CHOL", "HDL", "LDLCALC", "TRIG", "CHOLHDL", "LDLDIRECT" in the last 72 hours. Thyroid function studies No results for input(s): "TSH", "T4TOTAL", "T3FREE", "THYROIDAB" in the last 72 hours.  Invalid input(s): "FREET3" Anemia work up No results for input(s): "VITAMINB12", "FOLATE", "FERRITIN", "TIBC", "IRON", "RETICCTPCT" in the last 72 hours. Urinalysis No results found for: "COLORURINE", "APPEARANCEUR", "LABSPEC", "PHURINE", "GLUCOSEU", "HGBUR", "BILIRUBINUR", "KETONESUR", "PROTEINUR", "UROBILINOGEN", "NITRITE", "LEUKOCYTESUR" Sepsis Labs Recent Labs  Lab 03/13/22 0215 03/14/22 0544  WBC 7.6 8.0   Microbiology Recent Results (from the past 240 hour(s))  Resp panel by RT-PCR (RSV, Flu A&B, Covid) Anterior Nasal Swab     Status: Abnormal   Collection Time: 03/13/22  2:10 AM   Specimen: Anterior Nasal Swab  Result Value Ref Range Status   SARS Coronavirus 2 by RT PCR NEGATIVE NEGATIVE Final    Comment: (NOTE) SARS-CoV-2 target nucleic acids are NOT DETECTED.  The SARS-CoV-2 RNA is generally detectable in upper respiratory specimens during the acute phase of infection. The lowest concentration of SARS-CoV-2 viral copies this assay can  detect is 138 copies/mL. A negative result does not preclude SARS-Cov-2 infection and should not be used as the sole basis for treatment or other patient management decisions. A negative result may occur with  improper specimen collection/handling, submission of specimen other than nasopharyngeal swab, presence of viral mutation(s) within the areas targeted by this assay, and inadequate number of viral copies(<138 copies/mL). A negative result must be combined with clinical observations, patient history, and epidemiological information. The expected result is Negative.  Fact Sheet for Patients:  EntrepreneurPulse.com.au  Fact  Sheet for Healthcare Providers:  IncredibleEmployment.be  This test is no t yet approved or cleared by the [place] FDA and  has been authorized for detection and/or diagnosis of SARS-CoV-2 by FDA under an Emergency Use Authorization (EUA). This EUA will remain  in effect (meaning this test can be used) for the duration of the COVID-19 declaration under Section 564(b)(1) of the Act, 21 U.S.C.section 360bbb-3(b)(1), unless the authorization is terminated  or revoked sooner.       Influenza A by PCR POSITIVE (A) NEGATIVE Final   Influenza B by PCR NEGATIVE NEGATIVE Final    Comment: (NOTE) The Xpert Xpress SARS-CoV-2/FLU/RSV plus assay is intended as an aid in the diagnosis of influenza from Nasopharyngeal swab specimens and should not be used as a sole basis for treatment. Nasal washings and aspirates are unacceptable for Xpert Xpress SARS-CoV-2/FLU/RSV testing.  Fact Sheet for Patients: EntrepreneurPulse.com.au  Fact Sheet for Healthcare Providers: IncredibleEmployment.be  This test is not yet approved or cleared by the [place] FDA and has been authorized for detection and/or diagnosis of SARS-CoV-2 by FDA under an Emergency Use Authorization (EUA). This EUA will remain in effect (meaning this test can be used) for the duration of the COVID-19 declaration under Section 564(b)(1) of the Act, 21 U.S.C. section 360bbb-3(b)(1), unless the authorization is terminated or revoked.     Resp Syncytial Virus by PCR NEGATIVE NEGATIVE Final    Comment: (NOTE) Fact Sheet for Patients: EntrepreneurPulse.com.au  Fact Sheet for Healthcare Providers: IncredibleEmployment.be  This test is not yet approved or cleared by the [place] FDA and has been authorized for detection and/or diagnosis of SARS-CoV-2 by FDA under an Emergency Use Authorization (EUA). This EUA will remain in effect  (meaning this test can be used) for the duration of the COVID-19 declaration under Section 564(b)(1) of the Act, 21 U.S.C. section 360bbb-3(b)(1), unless the authorization is terminated or revoked.  Performed at [facility], [address] [place], [place] [address]   Culture, blood (Routine X 2) w Reflex to ID Panel     Status: None (Preliminary result)   Collection Time: 03/13/22  4:27 PM   Specimen: BLOOD  Result Value Ref Range Status   Specimen Description BLOOD BLOOD LEFT HAND  Final   Special Requests   Final    BOTTLES DRAWN AEROBIC AND ANAEROBIC Blood Culture adequate volume   Culture   Final    NO GROWTH 2 DAYS Performed at [facility], [address], [place], [place] [address]    Report Status PENDING  Incomplete  Culture, blood (Routine X 2) w Reflex to ID Panel     Status: None (Preliminary result)   Collection Time: 03/13/22  4:28 PM   Specimen: BLOOD  Result Value Ref Range Status   Specimen Description BLOOD RIGHT ANTECUBITAL  Final   Special Requests   Final    BOTTLES DRAWN AEROBIC AND ANAEROBIC Blood Culture adequate volume   Culture  Final    NO GROWTH 2 DAYS Performed at [facility], [address], [place], [place] [address]    Report Status PENDING  Incomplete    Time coordinating discharge: 37 mins  SIGNED:  Irwin Brakeman, MD  [facility] 03/15/2022, 9:45 AM How to contact the [facility] Attending or Consulting provider [place] or covering provider during after hours [place], for this patient?  Check the care team in [facility] and look for a) attending/consulting [facility] provider listed and b) the [facility] team listed Log into [email or web address] and use [facility]'s universal password to access. If you do not have the password, please contact the hospital operator. Locate the [facility] provider you are looking for under [facility] and page to a number that you can be directly reached. If you still have difficulty reaching the provider, please page the [facility]  (Director on Call) for the Hospitalists listed on amion for assistance.

## 2022-03-15 NOTE — Discharge Instructions (Signed)
IMPORTANT INFORMATION: PAY CLOSE ATTENTION   PHYSICIAN DISCHARGE INSTRUCTIONS  Follow with Primary care provider  [facility], [facility]  and other consultants as instructed by your Hospitalist Physician  SEEK MEDICAL CARE OR RETURN TO EMERGENCY ROOM IF SYMPTOMS COME BACK, WORSEN OR NEW PROBLEM DEVELOPS   Please note: You were cared for by a hospitalist during your hospital stay. Every effort will be made to forward records to your primary care provider.  You can request that your primary care provider send for your hospital records if they have not received them.  Once you are discharged, your primary care physician will handle any further medical issues. Please note that NO REFILLS for any discharge medications will be authorized once you are discharged, as it is imperative that you return to your primary care physician (or establish a relationship with a primary care physician if you do not have one) for your post hospital discharge needs so that they can reassess your need for medications and monitor your lab values.  Please get a complete blood count and chemistry panel checked by your Primary MD at your next visit, and again as instructed by your Primary MD.  Get Medicines reviewed and adjusted: Please take all your medications with you for your next visit with your Primary MD  Laboratory/radiological data: Please request your Primary MD to go over all hospital tests and procedure/radiological results at the follow up, please ask your primary care provider to get all Hospital records sent to his/her office.  In some cases, they will be blood work, cultures and biopsy results pending at the time of your discharge. Please request that your primary care provider follow up on these results.  If you are diabetic, please bring your blood sugar readings with you to your follow up appointment with primary care.    Please call and make your follow up appointments as soon as possible.     Also Note the following: If you experience worsening of your admission symptoms, develop shortness of breath, life threatening emergency, suicidal or homicidal thoughts you must seek medical attention immediately by calling 911 or calling your MD immediately  if symptoms less severe.  You must read complete instructions/literature along with all the possible adverse reactions/side effects for all the Medicines you take and that have been prescribed to you. Take any new Medicines after you have completely understood and accpet all the possible adverse reactions/side effects.   Do not drive when taking Pain medications or sleeping medications (Benzodiazepines)  Do not take more than prescribed Pain, Sleep and Anxiety Medications. It is not advisable to combine anxiety,sleep and pain medications without talking with your primary care practitioner  Special Instructions: If you have smoked or chewed Tobacco  in the last 2 yrs please stop smoking, stop any regular Alcohol  and or any Recreational drug use.  Wear Seat belts while driving.  Do not drive if taking any narcotic, mind altering or controlled substances or recreational drugs or alcohol.       

## 2022-03-18 LAB — CULTURE, BLOOD (ROUTINE X 2)
Culture: NO GROWTH
Culture: NO GROWTH
Special Requests: ADEQUATE
Special Requests: ADEQUATE

## 2022-03-23 DIAGNOSIS — I4891 Unspecified atrial fibrillation: Secondary | ICD-10-CM | POA: Diagnosis not present

## 2022-03-23 DIAGNOSIS — E876 Hypokalemia: Secondary | ICD-10-CM | POA: Diagnosis not present

## 2022-03-23 DIAGNOSIS — R531 Weakness: Secondary | ICD-10-CM | POA: Diagnosis not present

## 2022-03-23 DIAGNOSIS — Z6827 Body mass index (BMI) 27.0-27.9, adult: Secondary | ICD-10-CM | POA: Diagnosis not present

## 2022-03-29 DIAGNOSIS — M5416 Radiculopathy, lumbar region: Secondary | ICD-10-CM | POA: Diagnosis not present

## 2022-03-30 DIAGNOSIS — M79672 Pain in left foot: Secondary | ICD-10-CM | POA: Diagnosis not present

## 2022-03-30 DIAGNOSIS — M25579 Pain in unspecified ankle and joints of unspecified foot: Secondary | ICD-10-CM | POA: Diagnosis not present

## 2022-03-30 DIAGNOSIS — M79675 Pain in left toe(s): Secondary | ICD-10-CM | POA: Diagnosis not present

## 2022-03-30 DIAGNOSIS — I739 Peripheral vascular disease, unspecified: Secondary | ICD-10-CM | POA: Diagnosis not present

## 2022-03-30 DIAGNOSIS — M79674 Pain in right toe(s): Secondary | ICD-10-CM | POA: Diagnosis not present

## 2022-03-30 DIAGNOSIS — M792 Neuralgia and neuritis, unspecified: Secondary | ICD-10-CM | POA: Diagnosis not present

## 2022-03-30 DIAGNOSIS — M79671 Pain in right foot: Secondary | ICD-10-CM | POA: Diagnosis not present

## 2022-03-30 DIAGNOSIS — E1140 Type 2 diabetes mellitus with diabetic neuropathy, unspecified: Secondary | ICD-10-CM | POA: Diagnosis not present

## 2022-04-13 ENCOUNTER — Other Ambulatory Visit: Payer: Self-pay | Admitting: Cardiology

## 2022-04-14 DIAGNOSIS — Z6827 Body mass index (BMI) 27.0-27.9, adult: Secondary | ICD-10-CM | POA: Diagnosis not present

## 2022-04-14 DIAGNOSIS — R531 Weakness: Secondary | ICD-10-CM | POA: Diagnosis not present

## 2022-04-14 DIAGNOSIS — I4891 Unspecified atrial fibrillation: Secondary | ICD-10-CM | POA: Diagnosis not present

## 2022-04-14 DIAGNOSIS — E876 Hypokalemia: Secondary | ICD-10-CM | POA: Diagnosis not present

## 2022-04-19 DIAGNOSIS — M5416 Radiculopathy, lumbar region: Secondary | ICD-10-CM | POA: Diagnosis not present

## 2022-05-10 DIAGNOSIS — M5416 Radiculopathy, lumbar region: Secondary | ICD-10-CM | POA: Diagnosis not present

## 2022-06-02 ENCOUNTER — Other Ambulatory Visit: Payer: Self-pay | Admitting: Cardiology

## 2022-06-07 ENCOUNTER — Telehealth: Payer: Self-pay | Admitting: Cardiology

## 2022-06-07 NOTE — Telephone Encounter (Signed)
See AVS from 11/2021, pt supposed to get labs

## 2022-06-07 NOTE — Telephone Encounter (Signed)
  Pt is calling to ask Dr. Domenic Polite if he needs to get labs done before his appt on 04/04

## 2022-06-07 NOTE — Telephone Encounter (Signed)
Patient notified and verbalized understanding. 

## 2022-06-08 DIAGNOSIS — I739 Peripheral vascular disease, unspecified: Secondary | ICD-10-CM | POA: Diagnosis not present

## 2022-06-08 DIAGNOSIS — M79675 Pain in left toe(s): Secondary | ICD-10-CM | POA: Diagnosis not present

## 2022-06-08 DIAGNOSIS — M25579 Pain in unspecified ankle and joints of unspecified foot: Secondary | ICD-10-CM | POA: Diagnosis not present

## 2022-06-08 DIAGNOSIS — M79674 Pain in right toe(s): Secondary | ICD-10-CM | POA: Diagnosis not present

## 2022-06-08 DIAGNOSIS — M79672 Pain in left foot: Secondary | ICD-10-CM | POA: Diagnosis not present

## 2022-06-08 DIAGNOSIS — M79671 Pain in right foot: Secondary | ICD-10-CM | POA: Diagnosis not present

## 2022-06-08 DIAGNOSIS — E1140 Type 2 diabetes mellitus with diabetic neuropathy, unspecified: Secondary | ICD-10-CM | POA: Diagnosis not present

## 2022-06-08 DIAGNOSIS — M792 Neuralgia and neuritis, unspecified: Secondary | ICD-10-CM | POA: Diagnosis not present

## 2022-06-09 ENCOUNTER — Other Ambulatory Visit (HOSPITAL_COMMUNITY)
Admission: RE | Admit: 2022-06-09 | Discharge: 2022-06-09 | Disposition: A | Discharge status: Home / Self Care | Payer: Medicare Other | Source: Ambulatory Visit | Attending: Cardiology | Admitting: Cardiology

## 2022-06-09 ENCOUNTER — Telehealth: Payer: Self-pay

## 2022-06-09 DIAGNOSIS — I25119 Atherosclerotic heart disease of native coronary artery with unspecified angina pectoris: Secondary | ICD-10-CM

## 2022-06-09 LAB — CBC
HCT: 39.4 % (ref 39.0–52.0)
Hemoglobin: 12.7 g/dL — ABNORMAL LOW (ref 13.0–17.0)
MCH: 31.2 pg (ref 26.0–34.0)
MCHC: 32.2 g/dL (ref 30.0–36.0)
MCV: 96.8 fL (ref 80.0–100.0)
Platelets: 131 10*3/uL — ABNORMAL LOW (ref 150–400)
RBC: 4.07 MIL/uL — ABNORMAL LOW (ref 4.22–5.81)
RDW: 17.1 % — ABNORMAL HIGH (ref 11.5–15.5)
WBC: 6.3 10*3/uL (ref 4.0–10.5)
nRBC: 0 % (ref 0.0–0.2)

## 2022-06-09 LAB — BASIC METABOLIC PANEL
Anion gap: 9 (ref 5–15)
BUN: 14 mg/dL (ref 8–23)
CO2: 24 mmol/L (ref 22–32)
Calcium: 8.3 mg/dL — ABNORMAL LOW (ref 8.9–10.3)
Chloride: 105 mmol/L (ref 98–111)
Creatinine, Ser: 0.8 mg/dL (ref 0.61–1.24)
GFR, Estimated: >60 mL/min (ref >60)
Glucose, Bld: 143 mg/dL — ABNORMAL HIGH (ref 70–99)
Potassium: 3.8 mmol/L (ref 3.5–5.1)
Sodium: 138 mmol/L (ref 135–145)

## 2022-06-09 LAB — LIPID PANEL
Cholesterol: 86 mg/dL (ref 0–200)
HDL: 40 mg/dL — ABNORMAL LOW (ref >40)
LDL Cholesterol: 39 mg/dL (ref 0–99)
Total CHOL/HDL Ratio: 2.2 RATIO
Triglycerides: 35 mg/dL (ref <150)
VLDL: 7 mg/dL (ref 0–40)

## 2022-06-09 NOTE — Telephone Encounter (Signed)
-----   Message from Satira Sark, MD sent at 06/09/2022  9:03 AM EDT ----- Results reviewed.  Renal function potassium normal.  LDL 39.  Keep follow-up as scheduled.

## 2022-06-09 NOTE — Progress Notes (Unsigned)
Cardiology Office Note  Date: 06/10/2022   ID: [name], DOB Oct 09, 1933, MRN [id number]  History of Present Illness: Richard [name] is an 89 y.o. male last seen in September 2023.  He is here for a routine visit.  Reports no increasing sense of palpitations, no angina with stable NYHA class II dyspnea.  No recent nitroglycerin use.  Heart rate was up today in atrial fibrillation, confirmed by ECG.  Heart rate 1 10-1 20, nonspecific ST-T changes but otherwise stable findings.  He had not yet taken his morning medications.  I reviewed his cardiac regimen which is otherwise stable.  He reports no spontaneous bleeding problems on Eliquis.  Recent lab work also reviewed.  He does complain of fluctuating lower leg and ankle edema.  Discussed as needed use of low-dose Lasix which will be provided.  Physical Exam: VS:  BP 120/80   Pulse (!) 110   Ht 5\' 8"  (1.727 m)   Wt 176 lb (79.8 kg)   SpO2 94%   BMI 26.76 kg/m , BMI Body mass index is 26.76 kg/m.  Wt Readings from Last 3 Encounters:  06/10/22 176 lb (79.8 kg)  03/13/22 179 lb (81.2 kg)  12/02/21 183 lb (83 kg)    General: Patient appears comfortable at rest. HEENT: Conjunctiva and lids normal. Neck: Supple, no elevated JVP or carotid bruits. Lungs: Clear to auscultation, nonlabored breathing at rest. Cardiac: Irregularly irregular, 1/6 systolic murmur, no gallop. Extremities: 1-2+ lower leg edema.  ECG:  An ECG dated 03/13/2022 was personally reviewed today and demonstrated:  Atrial fibrillation with RVR.  Incomplete right bundle branch block.  Labwork: 03/14/2022: ALT 18; AST 25 03/15/2022: Magnesium 2.2 06/09/2022: BUN 14; Creatinine, Ser 0.80; Hemoglobin 12.7; Platelets 131; Potassium 3.8; Sodium 138     Component Value Date/Time   CHOL 86 06/09/2022 0818   TRIG 35 06/09/2022 0818   HDL 40 (L) 06/09/2022 0818   CHOLHDL 2.2 06/09/2022 0818   VLDL 7 06/09/2022 0818   LDLCALC 39 06/09/2022 0818   Other  Studies Reviewed Today:  Echocardiogram 05/08/2021:  1. Left ventricular ejection fraction, by estimation, is 50%. The left  ventricle has low normal function. The left ventricle has no regional wall  motion abnormalities. There is mild left ventricular hypertrophy. Left  ventricular diastolic parameters are   indeterminate.   2. Right ventricular systolic function is low normal. The right  ventricular size is moderately enlarged. There is mildly elevated  pulmonary artery systolic pressure.   3. Left atrial size was moderately dilated.   4. The mitral valve is abnormal. Moderate mitral valve regurgitation. No  evidence of mitral stenosis.   5. The tricuspid valve is abnormal. Tricuspid valve regurgitation is mild  to moderate.   6. The aortic valve is tricuspid. There is moderate calcification of the  aortic valve. There is moderate thickening of the aortic valve. Aortic  valve regurgitation is mild to moderate.   7. There is mild dilatation of the aortic root, measuring 42 mm.   8. The inferior vena cava is normal in size with greater than 50%  respiratory variability, suggesting right atrial pressure of 3 mmHg.    Lexiscan Myoview 05/08/2021:   Findings are consistent with prior inferior myocardial infarction with very mild peri-infarct ischemia. The study is high risk based on decreased LVEF, there is very mild current ischemia. Recomend correlating LVEF with echo. May be technical issue, grossly LVEF looks better than reported.   No ST deviation  was noted.   Left ventricular function is normal. Nuclear stress EF: 33 %. The left ventricular ejection fraction is moderately decreased (30-44%). End diastolic cavity size is normal. Slightly elevated TID could suggest a component of balanced ischemia.  Assessment and Plan:  1.  CAD with occluded RCA associated with collaterals and mild to moderate residual left system disease managed medically as of 2013.  LVEF 50% by echocardiogram in March  2023.  Ischemic testing via [facility] in March 2023 indicated inferior infarct scar with mild peri-infarct ischemia.  He reports no interval angina or nitroglycerin use.  Continue observation on medical therapy.  Currently on Toprol-XL, Imdur, Lipitor, Wilder Glade and as needed nitroglycerin  2.  Intermittent leg swelling as discussed above.  Prescription provided for Lasix 20 mg as needed.  Would try to avoid using this on a standing basis however.  3.  Permanent atrial fibrillation with CHA2DS2-VASc score of 3.  Heart rate up today, he had not yet taken his morning rate control medications including both Toprol-XL and Cardizem CD.  I asked him to continue with current regimen, he does check blood pressure and heart rate at home.  If he notices resting heart rates consistently over 100 we may need to further uptitrate therapy.  He reports no spontaneous bleeding problems on Eliquis.  I reviewed his recent lab work.  4.  Mitral regurgitation, moderate by echocardiogram in March 2023.  Disposition:  Follow up  6 months.  Signed, Satira Sark, M.D., F.A.C.C. [place] [facility]

## 2022-06-09 NOTE — Telephone Encounter (Signed)
Patients wife notified and verbalized understanding (ok per DPR). Pt's wife had no questions or concerns at this time. PCP copied.

## 2022-06-09 NOTE — Telephone Encounter (Signed)
-----   Message from Satira Sark, MD sent at 06/09/2022  8:41 AM EDT ----- Results reviewed.  Hemoglobin is 12.7 up from 12.5.  Continue with current medications and follow-up plan.

## 2022-06-10 ENCOUNTER — Encounter: Payer: Self-pay | Admitting: Cardiology

## 2022-06-10 ENCOUNTER — Ambulatory Visit: Payer: Medicare Other | Attending: Cardiology | Admitting: Cardiology

## 2022-06-10 VITALS — BP 120/80 | HR 110 | Ht 68.0 in | Wt 176.0 lb

## 2022-06-10 DIAGNOSIS — I25119 Atherosclerotic heart disease of native coronary artery with unspecified angina pectoris: Secondary | ICD-10-CM | POA: Insufficient documentation

## 2022-06-10 DIAGNOSIS — E782 Mixed hyperlipidemia: Secondary | ICD-10-CM | POA: Insufficient documentation

## 2022-06-10 DIAGNOSIS — I4821 Permanent atrial fibrillation: Secondary | ICD-10-CM | POA: Insufficient documentation

## 2022-06-10 DIAGNOSIS — R600 Localized edema: Secondary | ICD-10-CM | POA: Diagnosis not present

## 2022-06-10 MED ORDER — FUROSEMIDE 20 MG PO TABS: ORAL_TABLET | ORAL | 3 refills | Status: AC

## 2022-06-10 NOTE — Patient Instructions (Signed)
Medication Instructions:  Take Lasix 20 mg daily as needed for leg swelling   Labwork: None today  Testing/Procedures: None today  Follow-Up: 6 months  Any Other Special Instructions Will Be Listed Below (If Applicable).  If you need a refill on your cardiac medications before your next appointment, please call your pharmacy.  

## 2022-06-18 ENCOUNTER — Telehealth: Payer: Self-pay | Admitting: *Deleted

## 2022-06-18 MED ORDER — LOSARTAN POTASSIUM 25 MG PO TABS: 25.0000 mg | ORAL_TABLET | Freq: Every day | ORAL | 3 refills | Status: DC

## 2022-06-18 MED ORDER — METOPROLOL SUCCINATE ER 50 MG PO TB24: 50.0000 mg | ORAL_TABLET | Freq: Two times a day (BID) | ORAL | 3 refills | Status: DC

## 2022-06-18 NOTE — Telephone Encounter (Signed)
Patient had asked we speak with wife. She wrote down medication changes to include: Increase Toprol 50 mg BID and Decrease Losartan to 25 mg daily. They will continue to monitor BP and HR

## 2022-06-18 NOTE — Telephone Encounter (Signed)
Pt walked in office and reports that his heart rate has been elevated for the last 3 days. His BP at home was 132/76 and Hr 118. Pt states that he has been SOB and has an increase in palpitations. Current BP is 120/62 Hr 76. Please advise.

## 2022-07-21 DIAGNOSIS — H353132 Nonexudative age-related macular degeneration, bilateral, intermediate dry stage: Secondary | ICD-10-CM | POA: Diagnosis not present

## 2022-07-27 DIAGNOSIS — D485 Neoplasm of uncertain behavior of skin: Secondary | ICD-10-CM | POA: Diagnosis not present

## 2022-07-27 DIAGNOSIS — Z85828 Personal history of other malignant neoplasm of skin: Secondary | ICD-10-CM | POA: Diagnosis not present

## 2022-07-27 DIAGNOSIS — Z08 Encounter for follow-up examination after completed treatment for malignant neoplasm: Secondary | ICD-10-CM | POA: Diagnosis not present

## 2022-07-27 DIAGNOSIS — C44229 Squamous cell carcinoma of skin of left ear and external auricular canal: Secondary | ICD-10-CM | POA: Diagnosis not present

## 2022-07-27 DIAGNOSIS — L821 Other seborrheic keratosis: Secondary | ICD-10-CM | POA: Diagnosis not present

## 2022-07-27 DIAGNOSIS — L989 Disorder of the skin and subcutaneous tissue, unspecified: Secondary | ICD-10-CM | POA: Diagnosis not present

## 2022-07-27 DIAGNOSIS — R229 Localized swelling, mass and lump, unspecified: Secondary | ICD-10-CM | POA: Diagnosis not present

## 2022-07-27 DIAGNOSIS — C44329 Squamous cell carcinoma of skin of other parts of face: Secondary | ICD-10-CM | POA: Diagnosis not present

## 2022-08-24 DIAGNOSIS — E1140 Type 2 diabetes mellitus with diabetic neuropathy, unspecified: Secondary | ICD-10-CM | POA: Diagnosis not present

## 2022-08-24 DIAGNOSIS — M79675 Pain in left toe(s): Secondary | ICD-10-CM | POA: Diagnosis not present

## 2022-08-24 DIAGNOSIS — M792 Neuralgia and neuritis, unspecified: Secondary | ICD-10-CM | POA: Diagnosis not present

## 2022-08-24 DIAGNOSIS — M79672 Pain in left foot: Secondary | ICD-10-CM | POA: Diagnosis not present

## 2022-08-24 DIAGNOSIS — I739 Peripheral vascular disease, unspecified: Secondary | ICD-10-CM | POA: Diagnosis not present

## 2022-08-24 DIAGNOSIS — M25579 Pain in unspecified ankle and joints of unspecified foot: Secondary | ICD-10-CM | POA: Diagnosis not present

## 2022-08-24 DIAGNOSIS — M79674 Pain in right toe(s): Secondary | ICD-10-CM | POA: Diagnosis not present

## 2022-08-24 DIAGNOSIS — M79671 Pain in right foot: Secondary | ICD-10-CM | POA: Diagnosis not present

## 2022-08-25 DIAGNOSIS — C44229 Squamous cell carcinoma of skin of left ear and external auricular canal: Secondary | ICD-10-CM | POA: Diagnosis not present

## 2022-08-31 DIAGNOSIS — Z48817 Encounter for surgical aftercare following surgery on the skin and subcutaneous tissue: Secondary | ICD-10-CM | POA: Diagnosis not present

## 2022-08-31 DIAGNOSIS — L089 Local infection of the skin and subcutaneous tissue, unspecified: Secondary | ICD-10-CM | POA: Diagnosis not present

## 2022-08-31 DIAGNOSIS — Z4801 Encounter for change or removal of surgical wound dressing: Secondary | ICD-10-CM | POA: Diagnosis not present

## 2022-09-07 DIAGNOSIS — I2510 Atherosclerotic heart disease of native coronary artery without angina pectoris: Secondary | ICD-10-CM | POA: Diagnosis not present

## 2022-09-07 DIAGNOSIS — Z0000 Encounter for general adult medical examination without abnormal findings: Secondary | ICD-10-CM | POA: Diagnosis not present

## 2022-09-07 DIAGNOSIS — I482 Chronic atrial fibrillation, unspecified: Secondary | ICD-10-CM | POA: Diagnosis not present

## 2022-09-07 DIAGNOSIS — I10 Essential (primary) hypertension: Secondary | ICD-10-CM | POA: Diagnosis not present

## 2022-09-07 DIAGNOSIS — E1165 Type 2 diabetes mellitus with hyperglycemia: Secondary | ICD-10-CM | POA: Diagnosis not present

## 2022-09-07 DIAGNOSIS — Z6827 Body mass index (BMI) 27.0-27.9, adult: Secondary | ICD-10-CM | POA: Diagnosis not present

## 2022-09-08 ENCOUNTER — Other Ambulatory Visit: Payer: Self-pay | Admitting: Cardiology

## 2022-09-08 DIAGNOSIS — C44329 Squamous cell carcinoma of skin of other parts of face: Secondary | ICD-10-CM | POA: Diagnosis not present

## 2022-09-08 DIAGNOSIS — L570 Actinic keratosis: Secondary | ICD-10-CM | POA: Diagnosis not present

## 2022-09-19 ENCOUNTER — Other Ambulatory Visit: Payer: Self-pay

## 2022-09-19 ENCOUNTER — Encounter (HOSPITAL_COMMUNITY): Payer: Self-pay | Admitting: Emergency Medicine

## 2022-09-19 ENCOUNTER — Emergency Department (HOSPITAL_COMMUNITY): Payer: Medicare Other

## 2022-09-19 ENCOUNTER — Emergency Department (HOSPITAL_COMMUNITY)
Admission: EM | Admit: 2022-09-19 | Discharge: 2022-09-19 | Disposition: A | Discharge status: Home / Self Care | Payer: Medicare Other | Attending: Emergency Medicine | Admitting: Emergency Medicine

## 2022-09-19 DIAGNOSIS — I700 Atherosclerosis of aorta: Secondary | ICD-10-CM | POA: Diagnosis not present

## 2022-09-19 DIAGNOSIS — R911 Solitary pulmonary nodule: Secondary | ICD-10-CM | POA: Diagnosis not present

## 2022-09-19 DIAGNOSIS — R079 Chest pain, unspecified: Secondary | ICD-10-CM | POA: Diagnosis not present

## 2022-09-19 DIAGNOSIS — S0990XA Unspecified injury of head, initial encounter: Secondary | ICD-10-CM | POA: Diagnosis not present

## 2022-09-19 DIAGNOSIS — S199XXA Unspecified injury of neck, initial encounter: Secondary | ICD-10-CM | POA: Diagnosis not present

## 2022-09-19 DIAGNOSIS — I517 Cardiomegaly: Secondary | ICD-10-CM | POA: Diagnosis not present

## 2022-09-19 DIAGNOSIS — W01198A Fall on same level from slipping, tripping and stumbling with subsequent striking against other object, initial encounter: Secondary | ICD-10-CM | POA: Diagnosis not present

## 2022-09-19 DIAGNOSIS — S3993XA Unspecified injury of pelvis, initial encounter: Secondary | ICD-10-CM | POA: Diagnosis not present

## 2022-09-19 DIAGNOSIS — Z7901 Long term (current) use of anticoagulants: Secondary | ICD-10-CM | POA: Insufficient documentation

## 2022-09-19 DIAGNOSIS — M8588 Other specified disorders of bone density and structure, other site: Secondary | ICD-10-CM | POA: Diagnosis not present

## 2022-09-19 DIAGNOSIS — R0789 Other chest pain: Secondary | ICD-10-CM | POA: Diagnosis not present

## 2022-09-19 DIAGNOSIS — I771 Stricture of artery: Secondary | ICD-10-CM | POA: Diagnosis not present

## 2022-09-19 DIAGNOSIS — S299XXA Unspecified injury of thorax, initial encounter: Secondary | ICD-10-CM | POA: Diagnosis not present

## 2022-09-19 DIAGNOSIS — W19XXXA Unspecified fall, initial encounter: Secondary | ICD-10-CM

## 2022-09-19 DIAGNOSIS — S0101XA Laceration without foreign body of scalp, initial encounter: Secondary | ICD-10-CM | POA: Insufficient documentation

## 2022-09-19 DIAGNOSIS — M160 Bilateral primary osteoarthritis of hip: Secondary | ICD-10-CM | POA: Diagnosis not present

## 2022-09-19 DIAGNOSIS — S20219A Contusion of unspecified front wall of thorax, initial encounter: Secondary | ICD-10-CM | POA: Diagnosis not present

## 2022-09-19 DIAGNOSIS — M47816 Spondylosis without myelopathy or radiculopathy, lumbar region: Secondary | ICD-10-CM | POA: Diagnosis not present

## 2022-09-19 DIAGNOSIS — R220 Localized swelling, mass and lump, head: Secondary | ICD-10-CM | POA: Diagnosis not present

## 2022-09-19 DIAGNOSIS — R0989 Other specified symptoms and signs involving the circulatory and respiratory systems: Secondary | ICD-10-CM | POA: Diagnosis not present

## 2022-09-19 LAB — PROTIME-INR
INR: 1.1 (ref 0.8–1.2)
Prothrombin Time: 14.7 seconds (ref 11.4–15.2)

## 2022-09-19 LAB — COMPREHENSIVE METABOLIC PANEL
ALT: 25 U/L (ref 0–44)
AST: 26 U/L (ref 15–41)
Albumin: 3.6 g/dL (ref 3.5–5.0)
Alkaline Phosphatase: 53 U/L (ref 38–126)
Anion gap: 9 (ref 5–15)
BUN: 21 mg/dL (ref 8–23)
CO2: 24 mmol/L (ref 22–32)
Calcium: 8.8 mg/dL — ABNORMAL LOW (ref 8.9–10.3)
Chloride: 106 mmol/L (ref 98–111)
Creatinine, Ser: 1.04 mg/dL (ref 0.61–1.24)
GFR, Estimated: >60 mL/min (ref >60)
Glucose, Bld: 154 mg/dL — ABNORMAL HIGH (ref 70–99)
Potassium: 3.9 mmol/L (ref 3.5–5.1)
Sodium: 139 mmol/L (ref 135–145)
Total Bilirubin: 0.7 mg/dL (ref 0.3–1.2)
Total Protein: 6.5 g/dL (ref 6.5–8.1)

## 2022-09-19 LAB — TYPE AND SCREEN
ABO/RH(D): A POS
Antibody Screen: NEGATIVE

## 2022-09-19 LAB — CBC
HCT: 39.3 % (ref 39.0–52.0)
Hemoglobin: 12.5 g/dL — ABNORMAL LOW (ref 13.0–17.0)
MCH: 30.6 pg (ref 26.0–34.0)
MCHC: 31.8 g/dL (ref 30.0–36.0)
MCV: 96.3 fL (ref 80.0–100.0)
Platelets: 128 10*3/uL — ABNORMAL LOW (ref 150–400)
RBC: 4.08 MIL/uL — ABNORMAL LOW (ref 4.22–5.81)
RDW: 15.4 % (ref 11.5–15.5)
WBC: 5.6 10*3/uL (ref 4.0–10.5)
nRBC: 0 % (ref 0.0–0.2)

## 2022-09-19 MED ORDER — SODIUM CHLORIDE 0.9 % IV BOLUS
500.0000 mL | Freq: Once | INTRAVENOUS | Status: AC
  Administered 2022-09-19: 500 mL via INTRAVENOUS

## 2022-09-19 MED ORDER — FENTANYL CITRATE PF 50 MCG/ML IJ SOSY
50.0000 ug | PREFILLED_SYRINGE | Freq: Once | INTRAMUSCULAR | Status: AC
  Administered 2022-09-19: 50 ug via INTRAVENOUS
  Filled 2022-09-19: qty 1

## 2022-09-19 NOTE — ED Triage Notes (Signed)
Pt via POV after he fell in the shower at home, hitting the back of his head on the tub. Pt bit his tongue and reports pain to chest and neck. Pt takes thinners. Denies LOC; pt has a large hematoma to posterior scalp but bleeding is currently controlled. A/O x 4 and son is present to assist with history as needed.

## 2022-09-19 NOTE — Discharge Instructions (Addendum)
You were seen in the emergency department for evaluation of injuries from a fall.  You had a CAT scan of your head neck and chest that did not show any traumatic injuries.  You did have a scalp laceration that had 2 staples placed and these will need to be removed in 5 to 7 days.  You may get them wet in shower.  Return if any worsening or concerning symptoms

## 2022-09-19 NOTE — ED Provider Notes (Signed)
EMERGENCY DEPARTMENT AT [facility] Provider Note   CSN: [id number] Arrival date & time: 09/19/22  2139     History  Chief Complaint  Patient presents with   Richard [name] is a 89 y.o. male.  He said he was trying to get into the shower tonight when he slipped on the bath mat and fell down hitting the commode.  He hit his head and has a laceration on the back of his head.  He does not think he lost consciousness.  He is complaining of pain in his head and neck and chest.  He is not sure if he hit his chest going down.  No shortness of breath no abdominal pain vomiting diarrhea.  No numbness or weakness.  He is on Eliquis for atrial fibrillation.  Lives at home with his wife.  Does not usually use a cane or walker.  The history is provided by the patient and the spouse.  Fall This is a new problem. The current episode started 1 to 2 hours ago. The problem has not changed since onset.Associated symptoms include chest pain and headaches. Pertinent negatives include no abdominal pain and no shortness of breath. The symptoms are aggravated by bending and twisting. Nothing relieves the symptoms. He has tried nothing for the symptoms. The treatment provided no relief.       Home Medications Prior to Admission medications   Medication Sig Start Date End Date Taking? Authorizing Provider  acetaminophen (TYLENOL) 500 MG tablet Take 1,000 mg every 6 (six) hours as needed by mouth for mild pain or headache.    [provider]  albuterol (VENTOLIN HFA) 108 (90 Base) MCG/ACT inhaler Inhale 2 puffs into the lungs every 4 (four) hours as needed for wheezing or shortness of breath (cough, shortness of breath or wheezing.). 03/15/22   Cleora Fleet, MD  atorvastatin (LIPITOR) 80 MG tablet Take 1 tablet by mouth in the evening 09/08/22   Jonelle Sidle, MD  dapagliflozin propanediol (FARXIGA) 10 MG TABS tablet Take 10 mg by mouth daily.    [provider]  diclofenac Sodium (VOLTAREN) 1 % GEL Apply 2 g topically 2 (two) times daily as needed (pain). 08/18/21   [provider]  diltiazem (CARDIZEM CD) 240 MG 24 hr capsule Take 1 capsule (240 mg total) by mouth daily. 05/13/17 06/10/22  Jonelle Sidle, MD  ELIQUIS 5 MG TABS tablet TAKE 1 TABLET BY MOUTH TWICE DAILY Patient taking differently: Take 5 mg by mouth 2 (two) times daily. 01/26/18   Jonelle Sidle, MD  furosemide (LASIX) 20 MG tablet Take 20 mg daily as needed for leg swelling 06/10/22   Jonelle Sidle, MD  gabapentin (NEURONTIN) 300 MG capsule Take 300 mg by mouth 2 (two) times daily. 10/20/17   [provider]  isosorbide mononitrate (IMDUR) 30 MG 24 hr tablet TAKE 1 & 1/2 (ONE & ONE-HALF) TABLETS BY MOUTH ONCE DAILY 04/13/22   Jonelle Sidle, MD  losartan (COZAAR) 25 MG tablet Take 1 tablet (25 mg total) by mouth daily. 06/18/22 06/13/23  Jonelle Sidle, MD  metFORMIN (GLUCOPHAGE-XR) 500 MG 24 hr tablet Take 500 mg by mouth 2 (two) times daily.     [provider]  metoprolol succinate (TOPROL-XL) 50 MG 24 hr tablet Take 1 tablet (50 mg total) by mouth in the morning and at bedtime. Take with or immediately following a meal. 06/18/22 06/13/23  Nona Dell  G, MD  Multiple Vitamins-Minerals (PRESERVISION AREDS 2 PO) Take 2 tablets by mouth daily.    [provider]  nitroGLYCERIN (NITROSTAT) 0.4 MG SL tablet DISSOLVE 1 TABLET UNDER THE TONGUE EVERY 5 MINUTES AS NEEDED FOR CHEST PAIN. DO NOT EXCEED A TOTAL OF 3 DOSES IN 15 MINUTES. Patient taking differently: Place 0.4 mg under the tongue every 5 (five) minutes as needed for chest pain. 05/19/20   Jonelle Sidle, MD      Allergies    Niaspan [niacin er]    Review of Systems   Review of Systems  Constitutional:  Negative for fever.  Eyes:  Negative for visual disturbance.  Respiratory:  Negative for shortness of breath.   Cardiovascular:  Positive for chest pain.   Gastrointestinal:  Negative for abdominal pain.  Genitourinary:  Negative for dysuria.  Musculoskeletal:  Positive for neck pain.  Skin:  Positive for wound.  Neurological:  Positive for headaches. Negative for syncope.    Physical Exam Updated Vital Signs BP (!) 145/108   Pulse 97   Temp 98 F (36.7 C) (Oral)   Resp 18   Ht 5\' 8"  (1.727 m)   Wt 79.8 kg   SpO2 98%   BMI 26.76 kg/m  Physical Exam Vitals and nursing note reviewed.  Constitutional:      General: He is not in acute distress.    Appearance: Normal appearance. He is well-developed.  HENT:     Head: Normocephalic.     Comments: He has abrasions on the back of his head Eyes:     Conjunctiva/sclera: Conjunctivae normal.  Cardiovascular:     Rate and Rhythm: Normal rate. Rhythm irregular.     Heart sounds: No murmur heard. Pulmonary:     Effort: Pulmonary effort is normal. No respiratory distress.     Breath sounds: Normal breath sounds.  Abdominal:     Palpations: Abdomen is soft.     Tenderness: There is no abdominal tenderness. There is no guarding or rebound.  Musculoskeletal:        General: No deformity. Normal range of motion.     Cervical back: Neck supple. Tenderness present.     Right lower leg: Edema present.     Left lower leg: Edema present.  Skin:    General: Skin is warm and dry.     Capillary Refill: Capillary refill takes less than 2 seconds.  Neurological:     General: No focal deficit present.     Mental Status: He is alert and oriented to person, place, and time.     Cranial Nerves: No cranial nerve deficit.     Sensory: No sensory deficit.     Motor: No weakness.     ED Results / Procedures / Treatments   Labs (all labs ordered are listed, but only abnormal results are displayed) Labs Reviewed  CBC - Abnormal; Notable for the following components:      Result Value   RBC 4.08 (*)    Hemoglobin 12.5 (*)    Platelets 128 (*)    All other components within normal limits   COMPREHENSIVE METABOLIC PANEL - Abnormal; Notable for the following components:   Glucose, Bld 154 (*)    Calcium 8.8 (*)    All other components within normal limits  PROTIME-INR  TYPE AND SCREEN    EKG EKG Interpretation Date/Time:  Sunday September 19 2022 21:58:41 EDT Ventricular Rate:  106 PR Interval:    QRS Duration:  119 QT  Interval:  346 QTC Calculation: 460 R Axis:   28  Text Interpretation: Atrial fibrillation Ventricular premature complex Incomplete right bundle branch block Low voltage, precordial leads No significant change since prior 1/24 Confirmed by Meridee Score [phone]) on 09/19/2022 10:29:43 PM  Radiology CT Head Wo Contrast  Result Date: 09/19/2022 CLINICAL DATA:  Head trauma, minor (Age >= 65y); Neck trauma (Age >= 65y). Fall EXAM: CT HEAD WITHOUT CONTRAST CT CERVICAL SPINE WITHOUT CONTRAST TECHNIQUE: Multidetector CT imaging of the head and cervical spine was performed following the standard protocol without intravenous contrast. Multiplanar CT image reconstructions of the cervical spine were also generated. RADIATION DOSE REDUCTION: This exam was performed according to the departmental dose-optimization program which includes automated exposure control, adjustment of the mA and/or kV according to patient size and/or use of iterative reconstruction technique. COMPARISON:  None Available. FINDINGS: CT HEAD FINDINGS Brain: Normal anatomic configuration. Parenchymal volume loss is commensurate with the patient's age. Mild periventricular white matter changes are present likely reflecting the sequela of small vessel ischemia. No abnormal intra or extra-axial mass lesion or fluid collection. No abnormal mass effect or midline shift. No evidence of acute intracranial hemorrhage or infarct. Ventricular size is normal. Cerebellum unremarkable. Vascular: No asymmetric hyperdense vasculature at the skull base. Skull: Intact Sinuses/Orbits: Paranasal sinuses are clear. Orbits are  unremarkable. Other: There is fluid opacification of the left mastoid air cells as well as a small amount of fluid seen within the left middle ear cavity. No superimposed osseous erosion. Right mastoid air cells and middle ear cavity are clear. Mild occipital scalp soft tissue swelling. CT CERVICAL SPINE FINDINGS Alignment: Normal. Skull base and vertebrae: Craniocervical alignment is normal. The atlantodental interval is not widened. No acute fracture of the cervical spine. Vertebral body height is preserved. Soft tissues and spinal canal: No prevertebral fluid or swelling. No visible canal hematoma. Posterior disc osteophyte complex at C5-6 results in moderate to severe central canal stenosis with flattening of the thecal sac and an AP diameter of the spinal canal of approximately 5-6 mm. Disc levels: There is intervertebral disc space narrowing and endplate remodeling at C5-6 and C6-7 in keeping with changes of advanced degenerative disc disease. More moderate degenerative changes noted at C3-4. Prevertebral soft tissues are not thickened on sagittal reformats. Facet arthrosis in combination with variant anatomy of the vertebral artery with tortuosity on the right at C5-6 and on the left at C 3 4 and C4-5 results in severe left neuroforaminal narrowing at C3-4 and severe right neuroforaminal narrowing at C5-6. More moderate neuroforaminal narrowing noted on the right at C3-4, on the left at C5-6, and bilaterally at C6-7 Upper chest: Negative. Other: Moderate atherosclerotic calcification within the carotid bifurcations bilaterally. IMPRESSION: 1. No acute intracranial abnormality. No calvarial fracture. Mild occipital scalp soft tissue swelling. 2. Fluid opacification of the left mastoid air cells and middle ear cavity. No superimposed osseous erosion. Correlate for signs and symptoms of left-sided otomastoiditis. 3. No acute fracture or listhesis of the cervical spine. 4. Multilevel degenerative disc and  degenerative joint disease resulting in multilevel neuroforaminal narrowing as described above. Moderate to severe central canal stenosis at C5-6. Electronically Signed   By: Helyn Numbers M.D.   On: 09/19/2022 23:13   CT Cervical Spine Wo Contrast  Result Date: 09/19/2022 CLINICAL DATA:  Head trauma, minor (Age >= 65y); Neck trauma (Age >= 65y). Fall EXAM: CT HEAD WITHOUT CONTRAST CT CERVICAL SPINE WITHOUT CONTRAST TECHNIQUE: Multidetector CT imaging of the head and  cervical spine was performed following the standard protocol without intravenous contrast. Multiplanar CT image reconstructions of the cervical spine were also generated. RADIATION DOSE REDUCTION: This exam was performed according to the departmental dose-optimization program which includes automated exposure control, adjustment of the mA and/or kV according to patient size and/or use of iterative reconstruction technique. COMPARISON:  None Available. FINDINGS: CT HEAD FINDINGS Brain: Normal anatomic configuration. Parenchymal volume loss is commensurate with the patient's age. Mild periventricular white matter changes are present likely reflecting the sequela of small vessel ischemia. No abnormal intra or extra-axial mass lesion or fluid collection. No abnormal mass effect or midline shift. No evidence of acute intracranial hemorrhage or infarct. Ventricular size is normal. Cerebellum unremarkable. Vascular: No asymmetric hyperdense vasculature at the skull base. Skull: Intact Sinuses/Orbits: Paranasal sinuses are clear. Orbits are unremarkable. Other: There is fluid opacification of the left mastoid air cells as well as a small amount of fluid seen within the left middle ear cavity. No superimposed osseous erosion. Right mastoid air cells and middle ear cavity are clear. Mild occipital scalp soft tissue swelling. CT CERVICAL SPINE FINDINGS Alignment: Normal. Skull base and vertebrae: Craniocervical alignment is normal. The atlantodental interval  is not widened. No acute fracture of the cervical spine. Vertebral body height is preserved. Soft tissues and spinal canal: No prevertebral fluid or swelling. No visible canal hematoma. Posterior disc osteophyte complex at C5-6 results in moderate to severe central canal stenosis with flattening of the thecal sac and an AP diameter of the spinal canal of approximately 5-6 mm. Disc levels: There is intervertebral disc space narrowing and endplate remodeling at C5-6 and C6-7 in keeping with changes of advanced degenerative disc disease. More moderate degenerative changes noted at C3-4. Prevertebral soft tissues are not thickened on sagittal reformats. Facet arthrosis in combination with variant anatomy of the vertebral artery with tortuosity on the right at C5-6 and on the left at C 3 4 and C4-5 results in severe left neuroforaminal narrowing at C3-4 and severe right neuroforaminal narrowing at C5-6. More moderate neuroforaminal narrowing noted on the right at C3-4, on the left at C5-6, and bilaterally at C6-7 Upper chest: Negative. Other: Moderate atherosclerotic calcification within the carotid bifurcations bilaterally. IMPRESSION: 1. No acute intracranial abnormality. No calvarial fracture. Mild occipital scalp soft tissue swelling. 2. Fluid opacification of the left mastoid air cells and middle ear cavity. No superimposed osseous erosion. Correlate for signs and symptoms of left-sided otomastoiditis. 3. No acute fracture or listhesis of the cervical spine. 4. Multilevel degenerative disc and degenerative joint disease resulting in multilevel neuroforaminal narrowing as described above. Moderate to severe central canal stenosis at C5-6. Electronically Signed   By: Helyn Numbers M.D.   On: 09/19/2022 23:13   CT Chest Wo Contrast  Result Date: 09/19/2022 CLINICAL DATA:  Chest trauma EXAM: CT CHEST WITHOUT CONTRAST TECHNIQUE: Multidetector CT imaging of the chest was performed following the standard protocol  without IV contrast. RADIATION DOSE REDUCTION: This exam was performed according to the departmental dose-optimization program which includes automated exposure control, adjustment of the mA and/or kV according to patient size and/or use of iterative reconstruction technique. COMPARISON:  Chest CT 10/22/2021, report only. FINDINGS: Cardiovascular: The heart is moderately enlarged. There is no pericardial effusion. There are atherosclerotic calcifications of the aorta and coronary arteries. Mediastinum/Nodes: No enlarged mediastinal or axillary lymph nodes. Thyroid gland, trachea, and esophagus demonstrate no significant findings. Lungs/Pleura: There is a 4 mm nodule in the right lung apex. There some  reticular opacities in the lung bases, nonspecific. There are scattered calcified nodular densities in the right lung base. Upper Abdomen: No acute abnormality. Musculoskeletal: No chest wall mass or suspicious bone lesions identified. Degenerative changes affect the spine. IMPRESSION: 1. No acute cardiopulmonary process. 2. Moderate cardiomegaly. 3. Right solid pulmonary nodule within the upper lobe measuring 4 mm. Per Fleischner Society Guidelines, if patient is low risk for malignancy, no routine follow-up imaging is recommended. If patient is high risk for malignancy, a non-contrast Chest CT at 12 months is optional. If performed and the nodule is stable at 12 months, no further follow-up is recommended. These guidelines do not apply to immunocompromised patients and patients with cancer. Follow up in patients with significant comorbidities as clinically warranted. For lung cancer screening, adhere to Lung-RADS guidelines. Reference: Radiology. 2017; 284(1):228-43. Aortic Atherosclerosis (ICD10-I70.0). Electronically Signed   By: Darliss Cheney M.D.   On: 09/19/2022 23:10   DG Pelvis Portable  Result Date: 09/19/2022 CLINICAL DATA:  Fall injury in the shower at home. EXAM: PORTABLE PELVIS 1-2 VIEWS COMPARISON:   AP pelvis and right hip views 12/19/2016 FINDINGS: There is no evidence of pelvic fracture or diastasis. No pelvic bone lesions are seen. There is osteopenia with mild enthesopathic changes of the bony pelvis and femoral trochanters. There is mild nonerosive degenerative arthrosis at the hips, SI joints. Advanced degenerative change visualized lower lumbar spine. Comparison to prior study reveals no significant interval change. IMPRESSION: Osteopenia and degenerative change without AP evidence of acute fracture or diastasis. Advanced degenerative change visualized lower lumbar spine. Electronically Signed   By: Almira Bar M.D.   On: 09/19/2022 23:09   DG Chest Port 1 View  Result Date: 09/19/2022 CLINICAL DATA:  Chest pain.  Fell in shower at home. EXAM: PORTABLE CHEST 1 VIEW COMPARISON:  Chest radiograph 03/13/2022 and CT chest 09/19/2022 FINDINGS: Cardiomegaly. Aortic atherosclerotic calcification. Low lung volumes with basilar atelectasis and accentuation of the pulmonary vascularity. No pleural effusion or pneumothorax. No definite displaced rib fractures. IMPRESSION: No acute cardiopulmonary process. Electronically Signed   By: Minerva Fester M.D.   On: 09/19/2022 23:07    Procedures Procedures    Procedures .Marland KitchenLaceration Repair   Date/Time: 09/19/2022 11:21 PM   Performed by: Terrilee Files, MD Authorized by: Terrilee Files, MD   Consent:    Consent obtained:  Verbal   Consent given by:  Patient   Risks, benefits, and alternatives were discussed: yes     Risks discussed:  Infection, nerve damage, poor wound healing, pain, retained foreign body, tendon damage and vascular damage   Alternatives discussed:  No treatment, delayed treatment and referral Universal protocol:    Procedure explained and questions answered to patient or proxy's satisfaction: yes     Patient identity confirmed:  Verbally with patient Anesthesia:    Anesthesia method:  None Laceration details:     Location:  Scalp   Scalp location:  Crown   Length (cm):  2 Treatment:    Area cleansed with:  Saline   Amount of cleaning:  Standard   Irrigation solution:  Sterile saline Skin repair:    Repair method:  Staples   Number of staples:  2 Approximation:    Approximation:  Close Repair type:    Repair type:  Simple Post-procedure details:    Dressing:  Open (no dressing)   Procedure completion:  Tolerated well, no immediate complications Medications Ordered in ED Medications  sodium chloride 0.9 % bolus 500 mL (0  mLs Intravenous Stopped 09/19/22 2359)  fentaNYL (SUBLIMAZE) injection 50 mcg (50 mcg Intravenous Given 09/19/22 2320)    ED Course/ Medical Decision Making/ A&P Clinical Course as of 09/20/22 0857  Sun Sep 19, 2022  2312 Chest x-ray and pelvis x-ray do not show any acute traumatic findings.  Awaiting radiology reading. [MB]  2320 CT showing no acute bleed no fracture and CT chest shows no acute traumatic injury.  Does have cardiomegaly and pulmonary nodule that may need follow-up. [MB]    Clinical Course User Index [MB] Terrilee Files, MD                             Medical Decision Making Amount and/or Complexity of Data Reviewed Labs: ordered.  Risk Prescription drug management.   This patient complains of fall head injury head neck chest pain; this involves an extensive number of treatment Options and is a complaint that carries with it a high risk of complications and morbidity. The differential includes contusion, bleed, fracture, pneumothorax  I ordered, reviewed and interpreted labs, which included CBC with normal white count, hemoglobin stable, chemistries with elevated glucose I ordered medication IV pain medicine and fluids and reviewed PMP when indicated. I ordered imaging studies which included chest and pelvis x-ray CT head cervical spine and chest and I independently    visualized and interpreted imaging which showed no acute traumatic  findings Additional history obtained from patient's wife Previous records obtained and reviewed in epic including recent cardiology notes Cardiac monitoring reviewed, atrial fibrillation with variable rate Social determinants considered, no significant barriers Critical Interventions: None  After the interventions stated above, I reevaluated the patient and found awake alert neurologically intact Admission and further testing considered, he feels comfortable going home.  Will make sure he can ambulate in the department.  He has pain medicine at home from prior surgery.  Recommended close follow-up with PCP.  Return instructions discussed         Final Clinical Impression(s) / ED Diagnoses Final diagnoses:  Fall, initial encounter  Injury of head, initial encounter  Laceration of scalp, initial encounter  Contusion of chest wall, unspecified laterality, initial encounter    Rx / DC Orders ED Discharge Orders     None         Terrilee Files, MD 09/20/22 [phone]

## 2022-09-20 DIAGNOSIS — M940 Chondrocostal junction syndrome [Tietze]: Secondary | ICD-10-CM | POA: Diagnosis not present

## 2022-09-20 DIAGNOSIS — Z6827 Body mass index (BMI) 27.0-27.9, adult: Secondary | ICD-10-CM | POA: Diagnosis not present

## 2022-09-20 DIAGNOSIS — R030 Elevated blood-pressure reading, without diagnosis of hypertension: Secondary | ICD-10-CM | POA: Diagnosis not present

## 2022-09-20 DIAGNOSIS — S0101XD Laceration without foreign body of scalp, subsequent encounter: Secondary | ICD-10-CM | POA: Diagnosis not present

## 2022-10-06 DIAGNOSIS — I482 Chronic atrial fibrillation, unspecified: Secondary | ICD-10-CM | POA: Diagnosis not present

## 2022-10-06 DIAGNOSIS — E782 Mixed hyperlipidemia: Secondary | ICD-10-CM | POA: Diagnosis not present

## 2022-10-06 DIAGNOSIS — E119 Type 2 diabetes mellitus without complications: Secondary | ICD-10-CM | POA: Diagnosis not present

## 2022-10-06 DIAGNOSIS — I2510 Atherosclerotic heart disease of native coronary artery without angina pectoris: Secondary | ICD-10-CM | POA: Diagnosis not present

## 2022-10-14 ENCOUNTER — Other Ambulatory Visit: Payer: Self-pay | Admitting: Cardiology

## 2022-11-02 DIAGNOSIS — M25579 Pain in unspecified ankle and joints of unspecified foot: Secondary | ICD-10-CM | POA: Diagnosis not present

## 2022-11-02 DIAGNOSIS — M792 Neuralgia and neuritis, unspecified: Secondary | ICD-10-CM | POA: Diagnosis not present

## 2022-11-02 DIAGNOSIS — I739 Peripheral vascular disease, unspecified: Secondary | ICD-10-CM | POA: Diagnosis not present

## 2022-11-02 DIAGNOSIS — M79674 Pain in right toe(s): Secondary | ICD-10-CM | POA: Diagnosis not present

## 2022-11-02 DIAGNOSIS — E1140 Type 2 diabetes mellitus with diabetic neuropathy, unspecified: Secondary | ICD-10-CM | POA: Diagnosis not present

## 2022-11-02 DIAGNOSIS — M79671 Pain in right foot: Secondary | ICD-10-CM | POA: Diagnosis not present

## 2022-11-02 DIAGNOSIS — M79672 Pain in left foot: Secondary | ICD-10-CM | POA: Diagnosis not present

## 2022-11-02 DIAGNOSIS — M79675 Pain in left toe(s): Secondary | ICD-10-CM | POA: Diagnosis not present

## 2022-11-10 DIAGNOSIS — D229 Melanocytic nevi, unspecified: Secondary | ICD-10-CM | POA: Diagnosis not present

## 2022-11-10 DIAGNOSIS — L578 Other skin changes due to chronic exposure to nonionizing radiation: Secondary | ICD-10-CM | POA: Diagnosis not present

## 2022-11-10 DIAGNOSIS — Z85828 Personal history of other malignant neoplasm of skin: Secondary | ICD-10-CM | POA: Diagnosis not present

## 2022-11-10 DIAGNOSIS — L570 Actinic keratosis: Secondary | ICD-10-CM | POA: Diagnosis not present

## 2022-11-10 DIAGNOSIS — L821 Other seborrheic keratosis: Secondary | ICD-10-CM | POA: Diagnosis not present

## 2022-11-10 DIAGNOSIS — Z08 Encounter for follow-up examination after completed treatment for malignant neoplasm: Secondary | ICD-10-CM | POA: Diagnosis not present

## 2022-11-10 DIAGNOSIS — D485 Neoplasm of uncertain behavior of skin: Secondary | ICD-10-CM | POA: Diagnosis not present

## 2022-11-10 DIAGNOSIS — C44519 Basal cell carcinoma of skin of other part of trunk: Secondary | ICD-10-CM | POA: Diagnosis not present

## 2022-11-10 DIAGNOSIS — C44329 Squamous cell carcinoma of skin of other parts of face: Secondary | ICD-10-CM | POA: Diagnosis not present

## 2022-11-10 DIAGNOSIS — R229 Localized swelling, mass and lump, unspecified: Secondary | ICD-10-CM | POA: Diagnosis not present

## 2022-11-11 DIAGNOSIS — M79671 Pain in right foot: Secondary | ICD-10-CM | POA: Diagnosis not present

## 2022-11-11 DIAGNOSIS — E1140 Type 2 diabetes mellitus with diabetic neuropathy, unspecified: Secondary | ICD-10-CM | POA: Diagnosis not present

## 2022-11-11 DIAGNOSIS — L03031 Cellulitis of right toe: Secondary | ICD-10-CM | POA: Diagnosis not present

## 2022-11-11 DIAGNOSIS — M79674 Pain in right toe(s): Secondary | ICD-10-CM | POA: Diagnosis not present

## 2022-11-18 ENCOUNTER — Other Ambulatory Visit: Payer: Self-pay | Admitting: Cardiology

## 2022-11-18 MED ORDER — LOSARTAN POTASSIUM 25 MG PO TABS: 25.0000 mg | ORAL_TABLET | Freq: Every day | ORAL | 3 refills | Status: DC

## 2022-11-25 DIAGNOSIS — M79671 Pain in right foot: Secondary | ICD-10-CM | POA: Diagnosis not present

## 2022-11-25 DIAGNOSIS — M79674 Pain in right toe(s): Secondary | ICD-10-CM | POA: Diagnosis not present

## 2022-11-25 DIAGNOSIS — L03031 Cellulitis of right toe: Secondary | ICD-10-CM | POA: Diagnosis not present

## 2022-12-01 DIAGNOSIS — C44622 Squamous cell carcinoma of skin of right upper limb, including shoulder: Secondary | ICD-10-CM | POA: Diagnosis not present

## 2022-12-01 DIAGNOSIS — D485 Neoplasm of uncertain behavior of skin: Secondary | ICD-10-CM | POA: Diagnosis not present

## 2022-12-01 DIAGNOSIS — C44519 Basal cell carcinoma of skin of other part of trunk: Secondary | ICD-10-CM | POA: Diagnosis not present

## 2022-12-09 DIAGNOSIS — M79674 Pain in right toe(s): Secondary | ICD-10-CM | POA: Diagnosis not present

## 2022-12-09 DIAGNOSIS — L03031 Cellulitis of right toe: Secondary | ICD-10-CM | POA: Diagnosis not present

## 2022-12-09 DIAGNOSIS — M79671 Pain in right foot: Secondary | ICD-10-CM | POA: Diagnosis not present

## 2022-12-13 ENCOUNTER — Ambulatory Visit: Payer: Medicare Other | Attending: Cardiology | Admitting: Cardiology

## 2022-12-13 ENCOUNTER — Encounter: Payer: Self-pay | Admitting: Cardiology

## 2022-12-13 VITALS — BP 138/80 | HR 96 | Ht 68.0 in | Wt 179.8 lb

## 2022-12-13 DIAGNOSIS — I10 Essential (primary) hypertension: Secondary | ICD-10-CM

## 2022-12-13 DIAGNOSIS — I25119 Atherosclerotic heart disease of native coronary artery with unspecified angina pectoris: Secondary | ICD-10-CM | POA: Diagnosis present

## 2022-12-13 DIAGNOSIS — I4821 Permanent atrial fibrillation: Secondary | ICD-10-CM

## 2022-12-13 DIAGNOSIS — E782 Mixed hyperlipidemia: Secondary | ICD-10-CM | POA: Diagnosis present

## 2022-12-13 NOTE — Progress Notes (Signed)
Cardiology Office Note  Date: 12/13/2022   ID: Richard [name], DOB 06-03-1933, MRN [id number]  History of Present Illness: Richard [name] is an 89 y.o. male last seen in April.  He is here for a routine visit.  Reports no angina or nitroglycerin use, no specific sense of palpitations.  Does not describe any orthostatic dizziness, although did have a fall while he was in the shower back in July.  I reviewed his medications.  Cardiovascular regimen includes Eliquis, Cardizem CD, Lipitor, Imdur, Cozaar, Toprol-XL, Farxiga, and as needed nitroglycerin.  Lab work from April revealed LDL 39.  He does not describe any spontaneous bleeding problems or stool changes.  Renal function and hemoglobin stable as of July.  I rechecked his blood pressure today at 138/80 in the right arm.  Physical Exam: VS:  BP 138/80 (BP Location: Right Arm)   Pulse 96   Ht 5\' 8"  (1.727 m)   Wt 179 lb 12.8 oz (81.6 kg)   SpO2 93%   BMI 27.34 kg/m , BMI Body mass index is 27.34 kg/m.  Wt Readings from Last 3 Encounters:  12/13/22 179 lb 12.8 oz (81.6 kg)  09/19/22 176 lb (79.8 kg)  06/10/22 176 lb (79.8 kg)    General: Patient appears comfortable at rest. HEENT: Conjunctiva and lids normal. Neck: Supple, no elevated JVP or carotid bruits. Lungs: Clear to auscultation, nonlabored breathing at rest. Cardiac: Irregularly irregular, 1/6 systolic murmur, no gallop. Extremities: Mild ankle edema.  ECG:  An ECG dated 09/19/2022 was personally reviewed today and demonstrated:  Atrial fibrillation with nonspecific ST changes, PVC, heart rate 106 bpm.  Labwork: 03/15/2022: Magnesium 2.2 09/19/2022: ALT 25; AST 26; BUN 21; Creatinine, Ser 1.04; Hemoglobin 12.5; Platelets 128; Potassium 3.9; Sodium 139     Component Value Date/Time   CHOL 86 06/09/2022 0818   TRIG 35 06/09/2022 0818   HDL 40 (L) 06/09/2022 0818   CHOLHDL 2.2 06/09/2022 0818   VLDL 7 06/09/2022 0818   LDLCALC 39 06/09/2022 0818    Other Studies Reviewed Today:  Echocardiogram 05/08/2021:  1. Left ventricular ejection fraction, by estimation, is 50%. The left  ventricle has low normal function. The left ventricle has no regional wall  motion abnormalities. There is mild left ventricular hypertrophy. Left  ventricular diastolic parameters are   indeterminate.   2. Right ventricular systolic function is low normal. The right  ventricular size is moderately enlarged. There is mildly elevated  pulmonary artery systolic pressure.   3. Left atrial size was moderately dilated.   4. The mitral valve is abnormal. Moderate mitral valve regurgitation. No  evidence of mitral stenosis.   5. The tricuspid valve is abnormal. Tricuspid valve regurgitation is mild  to moderate.   6. The aortic valve is tricuspid. There is moderate calcification of the  aortic valve. There is moderate thickening of the aortic valve. Aortic  valve regurgitation is mild to moderate.   7. There is mild dilatation of the aortic root, measuring 42 mm.   8. The inferior vena cava is normal in size with greater than 50%  respiratory variability, suggesting right atrial pressure of 3 mmHg.  Assessment and Plan:  1.  CAD with occluded RCA associated with collaterals and mild to moderate residual left system disease managed medically as of 2013.  LVEF 50% by echocardiogram in March 2023.  Ischemic testing via Lexiscan Myoview in March 2023 indicated inferior infarct scar with mild peri-infarct ischemia.  He remains symptomatically stable  without angina or interval nitroglycerin use.  Plan to continue observation on medical therapy at this time.  Continue Lipitor, Imdur and as needed nitroglycerin.   2.  Permanent atrial fibrillation with CHA2DS2-VASc score of 3.  Asymptomatic in terms of palpitations and heart rate control adequate today on Cardizem CD and Toprol-XL.  Continue Eliquis for stroke prophylaxis.  He denies any spontaneous bleeding problems,  interval lab work reviewed.   3.  Mitral regurgitation, moderate by echocardiogram in March 2023.  Asymptomatic.  4.  Primary hypertension.  Stage I hypertension at this point.  Continue Cozaar, Cardizem CD, and Toprol-XL.  Disposition:  Follow up  6 months.  Signed, Jonelle Sidle, M.D., F.A.C.C. [facility] at [facility]

## 2022-12-13 NOTE — Patient Instructions (Signed)

## 2022-12-15 DIAGNOSIS — C44519 Basal cell carcinoma of skin of other part of trunk: Secondary | ICD-10-CM | POA: Diagnosis not present

## 2022-12-15 DIAGNOSIS — D485 Neoplasm of uncertain behavior of skin: Secondary | ICD-10-CM | POA: Diagnosis not present

## 2022-12-15 DIAGNOSIS — L570 Actinic keratosis: Secondary | ICD-10-CM | POA: Diagnosis not present

## 2022-12-15 DIAGNOSIS — L820 Inflamed seborrheic keratosis: Secondary | ICD-10-CM | POA: Diagnosis not present

## 2022-12-17 ENCOUNTER — Other Ambulatory Visit: Payer: Self-pay | Admitting: Cardiology

## 2022-12-17 DIAGNOSIS — Z23 Encounter for immunization: Secondary | ICD-10-CM | POA: Diagnosis not present

## 2022-12-29 DIAGNOSIS — L570 Actinic keratosis: Secondary | ICD-10-CM | POA: Diagnosis not present

## 2022-12-29 DIAGNOSIS — C44622 Squamous cell carcinoma of skin of right upper limb, including shoulder: Secondary | ICD-10-CM | POA: Diagnosis not present

## 2023-01-05 ENCOUNTER — Telehealth: Payer: Self-pay | Admitting: Cardiology

## 2023-01-05 NOTE — Telephone Encounter (Signed)
I told patients wife that we have not called patient

## 2023-01-05 NOTE — Telephone Encounter (Signed)
Spouse calling due to receiving call but not sure from whom or why or if it is for her or pt . Requesting cb

## 2023-01-06 DIAGNOSIS — E119 Type 2 diabetes mellitus without complications: Secondary | ICD-10-CM | POA: Diagnosis not present

## 2023-01-11 DIAGNOSIS — M792 Neuralgia and neuritis, unspecified: Secondary | ICD-10-CM | POA: Diagnosis not present

## 2023-01-11 DIAGNOSIS — M25579 Pain in unspecified ankle and joints of unspecified foot: Secondary | ICD-10-CM | POA: Diagnosis not present

## 2023-01-11 DIAGNOSIS — E1140 Type 2 diabetes mellitus with diabetic neuropathy, unspecified: Secondary | ICD-10-CM | POA: Diagnosis not present

## 2023-01-11 DIAGNOSIS — M79675 Pain in left toe(s): Secondary | ICD-10-CM | POA: Diagnosis not present

## 2023-01-11 DIAGNOSIS — M79672 Pain in left foot: Secondary | ICD-10-CM | POA: Diagnosis not present

## 2023-01-11 DIAGNOSIS — M79674 Pain in right toe(s): Secondary | ICD-10-CM | POA: Diagnosis not present

## 2023-01-11 DIAGNOSIS — M79671 Pain in right foot: Secondary | ICD-10-CM | POA: Diagnosis not present

## 2023-01-11 DIAGNOSIS — I739 Peripheral vascular disease, unspecified: Secondary | ICD-10-CM | POA: Diagnosis not present

## 2023-01-17 ENCOUNTER — Telehealth: Payer: Self-pay | Admitting: Cardiology

## 2023-01-17 MED ORDER — LOSARTAN POTASSIUM 25 MG PO TABS: 25.0000 mg | ORAL_TABLET | Freq: Every day | ORAL | 3 refills | Status: DC

## 2023-01-17 NOTE — Telephone Encounter (Signed)
Spoke to pt and verbalized that pt is supposed to take Losartan 25 mg every day, Imdur 30mg  (1.5 tablets) every day, and Toprol XL 50 mg tablets BID. Pt verbalized understanding and stated he would start taking Losartan 25 mg just once daily. Pt had no further questions at this time. Refill for losartan sent to [facility] pharmacy in [place] per pt's request.

## 2023-01-17 NOTE — Telephone Encounter (Signed)
Pt c/o medication issue:  1. Name of Medication:   losartan (COZAAR) 25 MG tablet  2. How are you currently taking this medication (dosage and times per day)?   3. Are you having a reaction (difficulty breathing--STAT)?   4. What is your medication issue?    Wife stated patient has been taking 2 tablets daily and is now completely out of this medication.  Wife wants clarification on the correct dose patient is supposed to take.  Wife wants refill sent to [facility] [address] [place] - [address]  See also previous note.

## 2023-01-17 NOTE — Telephone Encounter (Signed)
Patient verbalized understanding  

## 2023-01-17 NOTE — Telephone Encounter (Signed)
Pt c/o medication issue:  1. Name of Medication: losartan (COZAAR) 25 MG tablet   2. How are you currently taking this medication (dosage and times per day)?    3. Are you having a reaction (difficulty breathing--STAT)? no  4. What is your medication issue? Calling to see if the patient suppose to be taking 2 pills. The prescription says one. If its two a prescription needs to be sent out. Please advise

## 2023-01-17 NOTE — Telephone Encounter (Signed)
I spoke with the patient directly I advised him per medication list and per patient's last visit with Dr.McDowell  he is only supposed to take Losartan daily

## 2023-03-08 ENCOUNTER — Other Ambulatory Visit: Payer: Self-pay | Admitting: Cardiology

## 2023-03-15 DIAGNOSIS — E1165 Type 2 diabetes mellitus with hyperglycemia: Secondary | ICD-10-CM | POA: Diagnosis not present

## 2023-03-15 DIAGNOSIS — I4821 Permanent atrial fibrillation: Secondary | ICD-10-CM | POA: Diagnosis not present

## 2023-03-15 DIAGNOSIS — I10 Essential (primary) hypertension: Secondary | ICD-10-CM | POA: Diagnosis not present

## 2023-03-15 DIAGNOSIS — I2510 Atherosclerotic heart disease of native coronary artery without angina pectoris: Secondary | ICD-10-CM | POA: Diagnosis not present

## 2023-03-15 DIAGNOSIS — Z6829 Body mass index (BMI) 29.0-29.9, adult: Secondary | ICD-10-CM | POA: Diagnosis not present

## 2023-03-22 DIAGNOSIS — M25579 Pain in unspecified ankle and joints of unspecified foot: Secondary | ICD-10-CM | POA: Diagnosis not present

## 2023-03-22 DIAGNOSIS — E1140 Type 2 diabetes mellitus with diabetic neuropathy, unspecified: Secondary | ICD-10-CM | POA: Diagnosis not present

## 2023-03-22 DIAGNOSIS — M79671 Pain in right foot: Secondary | ICD-10-CM | POA: Diagnosis not present

## 2023-03-22 DIAGNOSIS — M79674 Pain in right toe(s): Secondary | ICD-10-CM | POA: Diagnosis not present

## 2023-03-22 DIAGNOSIS — M79675 Pain in left toe(s): Secondary | ICD-10-CM | POA: Diagnosis not present

## 2023-03-22 DIAGNOSIS — M792 Neuralgia and neuritis, unspecified: Secondary | ICD-10-CM | POA: Diagnosis not present

## 2023-03-22 DIAGNOSIS — M79672 Pain in left foot: Secondary | ICD-10-CM | POA: Diagnosis not present

## 2023-03-22 DIAGNOSIS — I739 Peripheral vascular disease, unspecified: Secondary | ICD-10-CM | POA: Diagnosis not present

## 2023-03-28 DIAGNOSIS — M19042 Primary osteoarthritis, left hand: Secondary | ICD-10-CM | POA: Diagnosis not present

## 2023-04-08 DIAGNOSIS — I10 Essential (primary) hypertension: Secondary | ICD-10-CM | POA: Diagnosis not present

## 2023-04-08 DIAGNOSIS — I482 Chronic atrial fibrillation, unspecified: Secondary | ICD-10-CM | POA: Diagnosis not present

## 2023-04-08 DIAGNOSIS — E119 Type 2 diabetes mellitus without complications: Secondary | ICD-10-CM | POA: Diagnosis not present

## 2023-04-20 ENCOUNTER — Other Ambulatory Visit: Payer: Self-pay | Admitting: Cardiology

## 2023-05-06 DIAGNOSIS — I482 Chronic atrial fibrillation, unspecified: Secondary | ICD-10-CM | POA: Diagnosis not present

## 2023-05-06 DIAGNOSIS — E119 Type 2 diabetes mellitus without complications: Secondary | ICD-10-CM | POA: Diagnosis not present

## 2023-05-06 DIAGNOSIS — I10 Essential (primary) hypertension: Secondary | ICD-10-CM | POA: Diagnosis not present

## 2023-05-11 DIAGNOSIS — R229 Localized swelling, mass and lump, unspecified: Secondary | ICD-10-CM | POA: Diagnosis not present

## 2023-05-11 DIAGNOSIS — Z08 Encounter for follow-up examination after completed treatment for malignant neoplasm: Secondary | ICD-10-CM | POA: Diagnosis not present

## 2023-05-11 DIAGNOSIS — D229 Melanocytic nevi, unspecified: Secondary | ICD-10-CM | POA: Diagnosis not present

## 2023-05-11 DIAGNOSIS — C44329 Squamous cell carcinoma of skin of other parts of face: Secondary | ICD-10-CM | POA: Diagnosis not present

## 2023-05-11 DIAGNOSIS — Z85828 Personal history of other malignant neoplasm of skin: Secondary | ICD-10-CM | POA: Diagnosis not present

## 2023-05-11 DIAGNOSIS — C44319 Basal cell carcinoma of skin of other parts of face: Secondary | ICD-10-CM | POA: Diagnosis not present

## 2023-05-11 DIAGNOSIS — D485 Neoplasm of uncertain behavior of skin: Secondary | ICD-10-CM | POA: Diagnosis not present

## 2023-05-11 DIAGNOSIS — L570 Actinic keratosis: Secondary | ICD-10-CM | POA: Diagnosis not present

## 2023-05-11 DIAGNOSIS — L821 Other seborrheic keratosis: Secondary | ICD-10-CM | POA: Diagnosis not present

## 2023-05-23 DIAGNOSIS — C44319 Basal cell carcinoma of skin of other parts of face: Secondary | ICD-10-CM | POA: Diagnosis not present

## 2023-05-31 DIAGNOSIS — I739 Peripheral vascular disease, unspecified: Secondary | ICD-10-CM | POA: Diagnosis not present

## 2023-05-31 DIAGNOSIS — M792 Neuralgia and neuritis, unspecified: Secondary | ICD-10-CM | POA: Diagnosis not present

## 2023-05-31 DIAGNOSIS — E1140 Type 2 diabetes mellitus with diabetic neuropathy, unspecified: Secondary | ICD-10-CM | POA: Diagnosis not present

## 2023-05-31 DIAGNOSIS — M25579 Pain in unspecified ankle and joints of unspecified foot: Secondary | ICD-10-CM | POA: Diagnosis not present

## 2023-05-31 DIAGNOSIS — M79674 Pain in right toe(s): Secondary | ICD-10-CM | POA: Diagnosis not present

## 2023-05-31 DIAGNOSIS — M79675 Pain in left toe(s): Secondary | ICD-10-CM | POA: Diagnosis not present

## 2023-05-31 DIAGNOSIS — M79671 Pain in right foot: Secondary | ICD-10-CM | POA: Diagnosis not present

## 2023-05-31 DIAGNOSIS — M79672 Pain in left foot: Secondary | ICD-10-CM | POA: Diagnosis not present

## 2023-06-06 DIAGNOSIS — I482 Chronic atrial fibrillation, unspecified: Secondary | ICD-10-CM | POA: Diagnosis not present

## 2023-06-06 DIAGNOSIS — I10 Essential (primary) hypertension: Secondary | ICD-10-CM | POA: Diagnosis not present

## 2023-06-06 DIAGNOSIS — E119 Type 2 diabetes mellitus without complications: Secondary | ICD-10-CM | POA: Diagnosis not present

## 2023-06-13 DIAGNOSIS — M7021 Olecranon bursitis, right elbow: Secondary | ICD-10-CM | POA: Diagnosis not present

## 2023-06-14 DIAGNOSIS — M792 Neuralgia and neuritis, unspecified: Secondary | ICD-10-CM | POA: Diagnosis not present

## 2023-06-14 DIAGNOSIS — M79675 Pain in left toe(s): Secondary | ICD-10-CM | POA: Diagnosis not present

## 2023-06-14 DIAGNOSIS — M79672 Pain in left foot: Secondary | ICD-10-CM | POA: Diagnosis not present

## 2023-06-14 DIAGNOSIS — M79674 Pain in right toe(s): Secondary | ICD-10-CM | POA: Diagnosis not present

## 2023-06-14 DIAGNOSIS — M25579 Pain in unspecified ankle and joints of unspecified foot: Secondary | ICD-10-CM | POA: Diagnosis not present

## 2023-06-14 DIAGNOSIS — E1140 Type 2 diabetes mellitus with diabetic neuropathy, unspecified: Secondary | ICD-10-CM | POA: Diagnosis not present

## 2023-06-14 DIAGNOSIS — M79671 Pain in right foot: Secondary | ICD-10-CM | POA: Diagnosis not present

## 2023-06-14 DIAGNOSIS — I739 Peripheral vascular disease, unspecified: Secondary | ICD-10-CM | POA: Diagnosis not present

## 2023-06-22 DIAGNOSIS — M7021 Olecranon bursitis, right elbow: Secondary | ICD-10-CM | POA: Diagnosis not present

## 2023-06-29 ENCOUNTER — Other Ambulatory Visit: Payer: Self-pay | Admitting: Cardiology

## 2023-06-29 DIAGNOSIS — Z6828 Body mass index (BMI) 28.0-28.9, adult: Secondary | ICD-10-CM | POA: Diagnosis not present

## 2023-06-29 DIAGNOSIS — R42 Dizziness and giddiness: Secondary | ICD-10-CM | POA: Diagnosis not present

## 2023-06-29 DIAGNOSIS — R5383 Other fatigue: Secondary | ICD-10-CM | POA: Diagnosis not present

## 2023-06-29 DIAGNOSIS — K529 Noninfective gastroenteritis and colitis, unspecified: Secondary | ICD-10-CM | POA: Diagnosis not present

## 2023-06-29 DIAGNOSIS — Z20828 Contact with and (suspected) exposure to other viral communicable diseases: Secondary | ICD-10-CM | POA: Diagnosis not present

## 2023-06-29 DIAGNOSIS — M7021 Olecranon bursitis, right elbow: Secondary | ICD-10-CM | POA: Diagnosis not present

## 2023-06-29 DIAGNOSIS — R519 Headache, unspecified: Secondary | ICD-10-CM | POA: Diagnosis not present

## 2023-07-04 ENCOUNTER — Ambulatory Visit: Admitting: Cardiology

## 2023-07-06 DIAGNOSIS — I482 Chronic atrial fibrillation, unspecified: Secondary | ICD-10-CM | POA: Diagnosis not present

## 2023-07-06 DIAGNOSIS — I10 Essential (primary) hypertension: Secondary | ICD-10-CM | POA: Diagnosis not present

## 2023-07-06 DIAGNOSIS — E119 Type 2 diabetes mellitus without complications: Secondary | ICD-10-CM | POA: Diagnosis not present

## 2023-07-12 DIAGNOSIS — I739 Peripheral vascular disease, unspecified: Secondary | ICD-10-CM | POA: Diagnosis not present

## 2023-07-12 DIAGNOSIS — M79671 Pain in right foot: Secondary | ICD-10-CM | POA: Diagnosis not present

## 2023-07-12 DIAGNOSIS — M79674 Pain in right toe(s): Secondary | ICD-10-CM | POA: Diagnosis not present

## 2023-07-12 DIAGNOSIS — E1140 Type 2 diabetes mellitus with diabetic neuropathy, unspecified: Secondary | ICD-10-CM | POA: Diagnosis not present

## 2023-07-12 DIAGNOSIS — M25579 Pain in unspecified ankle and joints of unspecified foot: Secondary | ICD-10-CM | POA: Diagnosis not present

## 2023-07-12 DIAGNOSIS — M792 Neuralgia and neuritis, unspecified: Secondary | ICD-10-CM | POA: Diagnosis not present

## 2023-07-12 DIAGNOSIS — M79672 Pain in left foot: Secondary | ICD-10-CM | POA: Diagnosis not present

## 2023-07-12 DIAGNOSIS — M79675 Pain in left toe(s): Secondary | ICD-10-CM | POA: Diagnosis not present

## 2023-07-19 DIAGNOSIS — Z48817 Encounter for surgical aftercare following surgery on the skin and subcutaneous tissue: Secondary | ICD-10-CM | POA: Diagnosis not present

## 2023-07-20 DIAGNOSIS — M7021 Olecranon bursitis, right elbow: Secondary | ICD-10-CM | POA: Diagnosis not present

## 2023-07-27 DIAGNOSIS — H353132 Nonexudative age-related macular degeneration, bilateral, intermediate dry stage: Secondary | ICD-10-CM | POA: Diagnosis not present

## 2023-08-05 DIAGNOSIS — E119 Type 2 diabetes mellitus without complications: Secondary | ICD-10-CM | POA: Diagnosis not present

## 2023-08-05 DIAGNOSIS — I10 Essential (primary) hypertension: Secondary | ICD-10-CM | POA: Diagnosis not present

## 2023-08-05 DIAGNOSIS — I482 Chronic atrial fibrillation, unspecified: Secondary | ICD-10-CM | POA: Diagnosis not present

## 2023-08-09 DIAGNOSIS — M79671 Pain in right foot: Secondary | ICD-10-CM | POA: Diagnosis not present

## 2023-08-09 DIAGNOSIS — M79672 Pain in left foot: Secondary | ICD-10-CM | POA: Diagnosis not present

## 2023-08-09 DIAGNOSIS — M25579 Pain in unspecified ankle and joints of unspecified foot: Secondary | ICD-10-CM | POA: Diagnosis not present

## 2023-08-09 DIAGNOSIS — M792 Neuralgia and neuritis, unspecified: Secondary | ICD-10-CM | POA: Diagnosis not present

## 2023-08-09 DIAGNOSIS — M79675 Pain in left toe(s): Secondary | ICD-10-CM | POA: Diagnosis not present

## 2023-08-09 DIAGNOSIS — E1140 Type 2 diabetes mellitus with diabetic neuropathy, unspecified: Secondary | ICD-10-CM | POA: Diagnosis not present

## 2023-08-09 DIAGNOSIS — M79674 Pain in right toe(s): Secondary | ICD-10-CM | POA: Diagnosis not present

## 2023-08-09 DIAGNOSIS — I739 Peripheral vascular disease, unspecified: Secondary | ICD-10-CM | POA: Diagnosis not present

## 2023-08-16 DIAGNOSIS — M79674 Pain in right toe(s): Secondary | ICD-10-CM | POA: Diagnosis not present

## 2023-08-16 DIAGNOSIS — M79675 Pain in left toe(s): Secondary | ICD-10-CM | POA: Diagnosis not present

## 2023-08-16 DIAGNOSIS — M79672 Pain in left foot: Secondary | ICD-10-CM | POA: Diagnosis not present

## 2023-08-16 DIAGNOSIS — M792 Neuralgia and neuritis, unspecified: Secondary | ICD-10-CM | POA: Diagnosis not present

## 2023-08-16 DIAGNOSIS — E1140 Type 2 diabetes mellitus with diabetic neuropathy, unspecified: Secondary | ICD-10-CM | POA: Diagnosis not present

## 2023-08-16 DIAGNOSIS — M79671 Pain in right foot: Secondary | ICD-10-CM | POA: Diagnosis not present

## 2023-08-16 DIAGNOSIS — M25579 Pain in unspecified ankle and joints of unspecified foot: Secondary | ICD-10-CM | POA: Diagnosis not present

## 2023-08-16 DIAGNOSIS — I739 Peripheral vascular disease, unspecified: Secondary | ICD-10-CM | POA: Diagnosis not present

## 2023-09-05 DIAGNOSIS — E119 Type 2 diabetes mellitus without complications: Secondary | ICD-10-CM | POA: Diagnosis not present

## 2023-09-05 DIAGNOSIS — I10 Essential (primary) hypertension: Secondary | ICD-10-CM | POA: Diagnosis not present

## 2023-09-05 DIAGNOSIS — I482 Chronic atrial fibrillation, unspecified: Secondary | ICD-10-CM | POA: Diagnosis not present

## 2023-09-08 DIAGNOSIS — C44329 Squamous cell carcinoma of skin of other parts of face: Secondary | ICD-10-CM | POA: Diagnosis not present

## 2023-09-08 DIAGNOSIS — D485 Neoplasm of uncertain behavior of skin: Secondary | ICD-10-CM | POA: Diagnosis not present

## 2023-09-08 DIAGNOSIS — L578 Other skin changes due to chronic exposure to nonionizing radiation: Secondary | ICD-10-CM | POA: Diagnosis not present

## 2023-09-14 ENCOUNTER — Ambulatory Visit: Admitting: Cardiology

## 2023-09-14 ENCOUNTER — Encounter: Payer: Self-pay | Admitting: Cardiology

## 2023-09-14 ENCOUNTER — Ambulatory Visit: Attending: Cardiology | Admitting: Cardiology

## 2023-09-14 VITALS — BP 138/82 | HR 63 | Ht 68.0 in | Wt 185.6 lb

## 2023-09-14 DIAGNOSIS — M79672 Pain in left foot: Secondary | ICD-10-CM | POA: Diagnosis not present

## 2023-09-14 DIAGNOSIS — E782 Mixed hyperlipidemia: Secondary | ICD-10-CM | POA: Diagnosis not present

## 2023-09-14 DIAGNOSIS — M25579 Pain in unspecified ankle and joints of unspecified foot: Secondary | ICD-10-CM | POA: Diagnosis not present

## 2023-09-14 DIAGNOSIS — I739 Peripheral vascular disease, unspecified: Secondary | ICD-10-CM | POA: Diagnosis not present

## 2023-09-14 DIAGNOSIS — I25119 Atherosclerotic heart disease of native coronary artery with unspecified angina pectoris: Secondary | ICD-10-CM | POA: Insufficient documentation

## 2023-09-14 DIAGNOSIS — M79675 Pain in left toe(s): Secondary | ICD-10-CM | POA: Diagnosis not present

## 2023-09-14 DIAGNOSIS — E1140 Type 2 diabetes mellitus with diabetic neuropathy, unspecified: Secondary | ICD-10-CM | POA: Diagnosis not present

## 2023-09-14 DIAGNOSIS — M792 Neuralgia and neuritis, unspecified: Secondary | ICD-10-CM | POA: Diagnosis not present

## 2023-09-14 DIAGNOSIS — I10 Essential (primary) hypertension: Secondary | ICD-10-CM | POA: Diagnosis not present

## 2023-09-14 DIAGNOSIS — I4821 Permanent atrial fibrillation: Secondary | ICD-10-CM | POA: Insufficient documentation

## 2023-09-14 DIAGNOSIS — M79671 Pain in right foot: Secondary | ICD-10-CM | POA: Diagnosis not present

## 2023-09-14 DIAGNOSIS — M79674 Pain in right toe(s): Secondary | ICD-10-CM | POA: Diagnosis not present

## 2023-09-14 NOTE — Patient Instructions (Signed)
 Medication Instructions:  Your physician recommends that you continue on your current medications as directed. Please refer to the Current Medication list given to you today.   Labwork: None today  Testing/Procedures: None today  Follow-Up: 6 months  Any Other Special Instructions Will Be Listed Below (If Applicable).  If you need a refill on your cardiac medications before your next appointment, please call your pharmacy.

## 2023-09-14 NOTE — Progress Notes (Signed)
 Cardiology Office Note  Date: 09/14/2023   ID: MOSTAFA YUAN, DOB [birth year], MRN [id number]  History of Present Illness: Richard [name] is an 89 y.o. male last seen in October 2024.  He is here for a routine visit.  Reports no specific change in stamina, no exertional angina or sense of palpitations.  Remains functional with ADLs.  No dizziness or syncope.  We went over his medications.  He reports compliance, had not taken his morning medications as yet today.  I rechecked his blood pressure at 138/82 in the right arm.  He continues to follow at [facility].  I went over his interval lab work.  He does not report any spontaneous bleeding problems on Eliquis .  I reviewed his ECG today which shows rate controlled atrial fibrillation with nonspecific ST-T changes.  Physical Exam: VS:  BP (!) 145/89 (BP Location: Right Arm, Cuff Size: Large)   Pulse 63   Ht 5' 8 (1.727 m)   Wt 185 lb 9.6 oz (84.2 kg)   SpO2 100%   BMI 28.22 kg/m , BMI Body mass index is 28.22 kg/m.  Wt Readings from Last 3 Encounters:  09/14/23 185 lb 9.6 oz (84.2 kg)  12/13/22 179 lb 12.8 oz (81.6 kg)  09/19/22 176 lb (79.8 kg)    General: Patient appears comfortable at rest. HEENT: Conjunctiva and lids normal. Neck: Supple, no elevated JVP or carotid bruits. Lungs: Clear to auscultation, nonlabored breathing at rest. Cardiac: Irregularly irregular, 1/6 systolic murmur, no gallop. Extremities: No pitting edema.  ECG:  An ECG dated 09/19/2022 was personally reviewed today and demonstrated:  Atrial fibrillation with nonspecific ST changes, PVC, heart rate 106 bpm.  Labwork: 09/19/2022: ALT 25; AST 26; BUN 21; Creatinine, Ser 1.04; Hemoglobin 12.5; Platelets 128; Potassium 3.9; Sodium 139     Component Value Date/Time   CHOL 86 06/09/2022 0818   TRIG 35 06/09/2022 0818   HDL 40 (L) 06/09/2022 0818   CHOLHDL 2.2 06/09/2022 0818   VLDL 7 06/09/2022 0818   LDLCALC 39 06/09/2022 0818  April 2025: BUN  19, creatinine 0.75, GFR 87, potassium 3.9, hemoglobin 14.3, platelets 118  Other Studies Reviewed Today:  No interval cardiac testing for review today.  Assessment and Plan:  1.  CAD with occluded RCA associated with collaterals and mild to moderate residual left system disease managed medically as of 2013.  LVEF 50% by echocardiogram in March 2023.  Ischemic testing via Lexiscan  Myoview  in March 2023 indicated inferior infarct scar with mild peri-infarct ischemia.  Plan to continue medical therapy and observation, he reports no active angina.  Continue Imdur  45 mg daily, Lipitor 80 mg daily, Farxiga  10 mg daily, and as needed nitroglycerin .   2.  Permanent atrial fibrillation with CHA2DS2-VASc score of 3.  Asymptomatic and heart rate adequately controlled today.  Continue Cardizem  CD 240 mg daily, Toprol -XL 50 mg twice daily, and Eliquis  5 mg twice daily.   3.  Mitral regurgitation, moderate by echocardiogram in March 2023.  Asymptomatic.  No significant change in murmur, continue observation for now.  Can consider reimaging over the next year.   4.  Primary hypertension.  No changes made to present regimen.  He is also on Cozaar  25 mg daily.  Track measurements at home and keep follow-up with PCP.  5.  Mixed hyperlipidemia.  Last LDL 39 on Lipitor 80 mg daily.  Disposition:  Follow up 6 months.  Signed, Jayson JUDITHANN Sierras, M.D., F.A.C.C. [facility] at [facility]  [place]

## 2023-09-16 DIAGNOSIS — I10 Essential (primary) hypertension: Secondary | ICD-10-CM | POA: Diagnosis not present

## 2023-09-16 DIAGNOSIS — I482 Chronic atrial fibrillation, unspecified: Secondary | ICD-10-CM | POA: Diagnosis not present

## 2023-09-16 DIAGNOSIS — Z0000 Encounter for general adult medical examination without abnormal findings: Secondary | ICD-10-CM | POA: Diagnosis not present

## 2023-09-16 DIAGNOSIS — Z0001 Encounter for general adult medical examination with abnormal findings: Secondary | ICD-10-CM | POA: Diagnosis not present

## 2023-09-16 DIAGNOSIS — Z1331 Encounter for screening for depression: Secondary | ICD-10-CM | POA: Diagnosis not present

## 2023-09-16 DIAGNOSIS — E119 Type 2 diabetes mellitus without complications: Secondary | ICD-10-CM | POA: Diagnosis not present

## 2023-09-16 DIAGNOSIS — Z1389 Encounter for screening for other disorder: Secondary | ICD-10-CM | POA: Diagnosis not present

## 2023-09-16 DIAGNOSIS — Z6828 Body mass index (BMI) 28.0-28.9, adult: Secondary | ICD-10-CM | POA: Diagnosis not present

## 2023-09-16 DIAGNOSIS — Z23 Encounter for immunization: Secondary | ICD-10-CM | POA: Diagnosis not present

## 2023-09-23 DIAGNOSIS — H353122 Nonexudative age-related macular degeneration, left eye, intermediate dry stage: Secondary | ICD-10-CM | POA: Diagnosis not present

## 2023-10-06 DIAGNOSIS — I482 Chronic atrial fibrillation, unspecified: Secondary | ICD-10-CM | POA: Diagnosis not present

## 2023-10-06 DIAGNOSIS — I10 Essential (primary) hypertension: Secondary | ICD-10-CM | POA: Diagnosis not present

## 2023-10-06 DIAGNOSIS — E119 Type 2 diabetes mellitus without complications: Secondary | ICD-10-CM | POA: Diagnosis not present

## 2023-10-07 DIAGNOSIS — C44329 Squamous cell carcinoma of skin of other parts of face: Secondary | ICD-10-CM | POA: Diagnosis not present

## 2023-10-12 DIAGNOSIS — M792 Neuralgia and neuritis, unspecified: Secondary | ICD-10-CM | POA: Diagnosis not present

## 2023-10-12 DIAGNOSIS — M79671 Pain in right foot: Secondary | ICD-10-CM | POA: Diagnosis not present

## 2023-10-12 DIAGNOSIS — M79675 Pain in left toe(s): Secondary | ICD-10-CM | POA: Diagnosis not present

## 2023-10-12 DIAGNOSIS — M79674 Pain in right toe(s): Secondary | ICD-10-CM | POA: Diagnosis not present

## 2023-10-12 DIAGNOSIS — I739 Peripheral vascular disease, unspecified: Secondary | ICD-10-CM | POA: Diagnosis not present

## 2023-10-12 DIAGNOSIS — M79672 Pain in left foot: Secondary | ICD-10-CM | POA: Diagnosis not present

## 2023-10-12 DIAGNOSIS — E1140 Type 2 diabetes mellitus with diabetic neuropathy, unspecified: Secondary | ICD-10-CM | POA: Diagnosis not present

## 2023-10-12 DIAGNOSIS — M25579 Pain in unspecified ankle and joints of unspecified foot: Secondary | ICD-10-CM | POA: Diagnosis not present

## 2023-10-18 DIAGNOSIS — M79674 Pain in right toe(s): Secondary | ICD-10-CM | POA: Diagnosis not present

## 2023-10-18 DIAGNOSIS — I739 Peripheral vascular disease, unspecified: Secondary | ICD-10-CM | POA: Diagnosis not present

## 2023-10-18 DIAGNOSIS — M79671 Pain in right foot: Secondary | ICD-10-CM | POA: Diagnosis not present

## 2023-10-18 DIAGNOSIS — M792 Neuralgia and neuritis, unspecified: Secondary | ICD-10-CM | POA: Diagnosis not present

## 2023-10-18 DIAGNOSIS — M25579 Pain in unspecified ankle and joints of unspecified foot: Secondary | ICD-10-CM | POA: Diagnosis not present

## 2023-10-18 DIAGNOSIS — E1140 Type 2 diabetes mellitus with diabetic neuropathy, unspecified: Secondary | ICD-10-CM | POA: Diagnosis not present

## 2023-10-18 DIAGNOSIS — M79675 Pain in left toe(s): Secondary | ICD-10-CM | POA: Diagnosis not present

## 2023-10-18 DIAGNOSIS — M79672 Pain in left foot: Secondary | ICD-10-CM | POA: Diagnosis not present

## 2023-11-04 DIAGNOSIS — I10 Essential (primary) hypertension: Secondary | ICD-10-CM | POA: Diagnosis not present

## 2023-11-04 DIAGNOSIS — I482 Chronic atrial fibrillation, unspecified: Secondary | ICD-10-CM | POA: Diagnosis not present

## 2023-11-04 DIAGNOSIS — E119 Type 2 diabetes mellitus without complications: Secondary | ICD-10-CM | POA: Diagnosis not present

## 2023-11-08 DIAGNOSIS — N181 Chronic kidney disease, stage 1: Secondary | ICD-10-CM | POA: Diagnosis not present

## 2023-11-10 DIAGNOSIS — M792 Neuralgia and neuritis, unspecified: Secondary | ICD-10-CM | POA: Diagnosis not present

## 2023-11-10 DIAGNOSIS — M25579 Pain in unspecified ankle and joints of unspecified foot: Secondary | ICD-10-CM | POA: Diagnosis not present

## 2023-11-10 DIAGNOSIS — M79672 Pain in left foot: Secondary | ICD-10-CM | POA: Diagnosis not present

## 2023-11-10 DIAGNOSIS — I739 Peripheral vascular disease, unspecified: Secondary | ICD-10-CM | POA: Diagnosis not present

## 2023-11-10 DIAGNOSIS — M79674 Pain in right toe(s): Secondary | ICD-10-CM | POA: Diagnosis not present

## 2023-11-10 DIAGNOSIS — E1140 Type 2 diabetes mellitus with diabetic neuropathy, unspecified: Secondary | ICD-10-CM | POA: Diagnosis not present

## 2023-11-10 DIAGNOSIS — M79675 Pain in left toe(s): Secondary | ICD-10-CM | POA: Diagnosis not present

## 2023-11-10 DIAGNOSIS — M79671 Pain in right foot: Secondary | ICD-10-CM | POA: Diagnosis not present

## 2023-11-25 DIAGNOSIS — C44329 Squamous cell carcinoma of skin of other parts of face: Secondary | ICD-10-CM | POA: Diagnosis not present

## 2023-12-06 DIAGNOSIS — I10 Essential (primary) hypertension: Secondary | ICD-10-CM | POA: Diagnosis not present

## 2023-12-06 DIAGNOSIS — E119 Type 2 diabetes mellitus without complications: Secondary | ICD-10-CM | POA: Diagnosis not present

## 2023-12-06 DIAGNOSIS — I482 Chronic atrial fibrillation, unspecified: Secondary | ICD-10-CM | POA: Diagnosis not present

## 2023-12-18 ENCOUNTER — Encounter (HOSPITAL_COMMUNITY): Payer: Self-pay

## 2023-12-18 ENCOUNTER — Emergency Department (HOSPITAL_COMMUNITY)
Admission: EM | Admit: 2023-12-18 | Discharge: 2023-12-18 | Disposition: A | Discharge status: Home / Self Care | Attending: Emergency Medicine | Admitting: Emergency Medicine

## 2023-12-18 ENCOUNTER — Emergency Department (HOSPITAL_COMMUNITY)

## 2023-12-18 ENCOUNTER — Other Ambulatory Visit: Payer: Self-pay

## 2023-12-18 DIAGNOSIS — M1812 Unilateral primary osteoarthritis of first carpometacarpal joint, left hand: Secondary | ICD-10-CM | POA: Diagnosis not present

## 2023-12-18 DIAGNOSIS — M19042 Primary osteoarthritis, left hand: Secondary | ICD-10-CM | POA: Diagnosis not present

## 2023-12-18 DIAGNOSIS — M25532 Pain in left wrist: Secondary | ICD-10-CM | POA: Diagnosis not present

## 2023-12-18 DIAGNOSIS — Z7901 Long term (current) use of anticoagulants: Secondary | ICD-10-CM | POA: Diagnosis not present

## 2023-12-18 DIAGNOSIS — M19032 Primary osteoarthritis, left wrist: Secondary | ICD-10-CM | POA: Diagnosis not present

## 2023-12-18 DIAGNOSIS — M79642 Pain in left hand: Secondary | ICD-10-CM

## 2023-12-18 DIAGNOSIS — G5602 Carpal tunnel syndrome, left upper limb: Secondary | ICD-10-CM | POA: Insufficient documentation

## 2023-12-18 DIAGNOSIS — M7989 Other specified soft tissue disorders: Secondary | ICD-10-CM | POA: Diagnosis not present

## 2023-12-18 MED ORDER — OXYCODONE-ACETAMINOPHEN 5-325 MG PO TABS: 1.0000 | ORAL_TABLET | Freq: Four times a day (QID) | ORAL | 0 refills | Status: AC | PRN

## 2023-12-18 MED ORDER — OXYCODONE-ACETAMINOPHEN 5-325 MG PO TABS
1.0000 | ORAL_TABLET | Freq: Once | ORAL | Status: AC
  Administered 2023-12-18: 1 via ORAL
  Filled 2023-12-18: qty 1

## 2023-12-18 NOTE — Discharge Instructions (Addendum)
 Please ice and elevate your hand and wrist.  Ace wrap for comfort.  Tylenol  for pain. Oxycodone  for severe pain.  Monitor for causing constipation.  follow-up with your primary care doctor and Dr. Margrette orthopedics.  Return if any worsening or concerning symptoms

## 2023-12-18 NOTE — ED Notes (Signed)
 ED Provider at bedside.

## 2023-12-18 NOTE — ED Triage Notes (Signed)
 Patient come in POV Complaint of left hand pain stated  It woke me up this morning and effecting thumb, pointer and middle finger.

## 2023-12-18 NOTE — ED Provider Notes (Signed)
 [place] EMERGENCY DEPARTMENT AT [facility] Provider Note   CSN: [id number] Arrival date & time: 12/18/23  9247     Patient presents with: Hand Pain   Richard [name] is a 89 y.o. male.  He is here with complaints of left hand and wrist swelling and pain.  It woke him up around 3 AM and has been hurting ever since then.  Radiates into his thumb index and middle finger.  Worse with movement.  No numbness or weakness.  He does not recall any specific trauma but he said he was trying to twist something that was very stuck yesterday.  He is right-hand dominant.  He is on a blood thinner.   The history is provided by the patient.  Hand Pain This is a new problem. The current episode started 3 to 5 hours ago. The problem occurs constantly. The problem has not changed since onset.Pertinent negatives include no chest pain, no abdominal pain, no headaches and no shortness of breath. The symptoms are aggravated by bending and twisting. Nothing relieves the symptoms. He has tried nothing for the symptoms. The treatment provided no relief.       Prior to Admission medications   Medication Sig Start Date End Date Taking? Authorizing Provider  acetaminophen  (TYLENOL ) 500 MG tablet Take 1,000 mg every 6 (six) hours as needed by mouth for mild pain or headache.    [provider]  albuterol  (VENTOLIN  HFA) 108 (90 Base) MCG/ACT inhaler Inhale 2 puffs into the lungs every 4 (four) hours as needed for wheezing or shortness of breath (cough, shortness of breath or wheezing.). 03/15/22   Vicci Afton CROME, MD  atorvastatin  (LIPITOR) 80 MG tablet Take 1 tablet by mouth in the evening 03/08/23   Debera Jayson MATSU, MD  dapagliflozin  propanediol (FARXIGA ) 10 MG TABS tablet Take 10 mg by mouth daily.    [provider]  diclofenac Sodium (VOLTAREN) 1 % GEL Apply 2 g topically 2 (two) times daily as needed (pain). 08/18/21   [provider]  diltiazem  (CARDIZEM  CD) 240  MG 24 hr capsule Take 1 capsule (240 mg total) by mouth daily. 05/13/17 09/13/96  Debera Jayson MATSU, MD  ELIQUIS  5 MG TABS tablet TAKE 1 TABLET BY MOUTH TWICE DAILY 01/26/18   Debera Jayson MATSU, MD  furosemide  (LASIX ) 20 MG tablet Take 20 mg daily as needed for leg swelling 06/10/22   Debera Jayson MATSU, MD  gabapentin  (NEURONTIN ) 300 MG capsule Take 300 mg by mouth 2 (two) times daily. 10/20/17   [provider]  isosorbide  mononitrate (IMDUR ) 30 MG 24 hr tablet TAKE 1 & 1/2 (ONE & ONE-HALF) TABLETS BY MOUTH ONCE DAILY 04/20/23   Debera Jayson MATSU, MD  losartan  (COZAAR ) 25 MG tablet Take 1 tablet (25 mg total) by mouth daily. 01/17/23 01/12/24  Debera Jayson MATSU, MD  metFORMIN  (GLUCOPHAGE -XR) 500 MG 24 hr tablet Take 500 mg by mouth 2 (two) times daily.     [provider]  metoprolol  succinate (TOPROL -XL) 50 MG 24 hr tablet TAKE 1 TABLET BY MOUTH IN THE MORNING AND 1 AT BEDTIME WITH OR IMMEDIATELY FOLLOWING A MEAL 06/29/23   Debera Jayson MATSU, MD  Multiple Vitamins-Minerals (PRESERVISION AREDS 2 PO) Take 2 tablets by mouth daily.    [provider]  nitroGLYCERIN  (NITROSTAT ) 0.4 MG SL tablet DISSOLVE ONE TABLET UNDER THE TONGUE EVERY 5 MINUTES AS NEEDED FOR CHEST PAIN.  DO NOT EXCEED A TOTAL OF 3 DOSES IN 15 MINUTES  12/17/22   Debera Jayson MATSU, MD    Allergies: Niaspan [niacin er (antihyperlipidemic)]    Review of Systems  Constitutional:  Negative for fever.  Respiratory:  Negative for shortness of breath.   Cardiovascular:  Negative for chest pain.  Gastrointestinal:  Negative for abdominal pain.  Neurological:  Negative for weakness, numbness and headaches.    Updated Vital Signs BP 118/84   Pulse 81   Temp 97.7 F (36.5 C) (Oral)   Resp 16   Ht 5' 8 (1.727 m)   Wt 81.6 kg   SpO2 93%   BMI 27.37 kg/m   Physical Exam Vitals and nursing note reviewed.  Constitutional:      Appearance: Normal appearance. He is well-developed.  HENT:     Head:  Normocephalic and atraumatic.  Eyes:     Conjunctiva/sclera: Conjunctivae normal.  Pulmonary:     Effort: Pulmonary effort is normal.  Musculoskeletal:        General: Swelling and tenderness present. Normal range of motion.     Cervical back: Neck supple.     Comments: Left hand he has moderate swelling over his wrist and dorsum of hand.  There is some palpable tenderness.  No overlying wounds.  Distal motor and sensation intact.  Radial pulse 2+.  No erythema or warmth.  Skin:    General: Skin is warm and dry.  Neurological:     General: No focal deficit present.     Mental Status: He is alert.     GCS: GCS eye subscore is 4. GCS verbal subscore is 5. GCS motor subscore is 6.     Sensory: No sensory deficit.     Motor: No weakness.     (all labs ordered are listed, but only abnormal results are displayed) Labs Reviewed - No data to display  EKG: None  Radiology: DG Hand Complete Left Result Date: 12/18/2023 EXAM: 3 OR MORE VIEW(S) XRAY OF THE LEFT HAND 12/18/2023 08:32:42 AM COMPARISON: Left wrist series reported separately today. CLINICAL HISTORY: 89 year old male with left wrist and hand pain, swelling, and difficulty moving first three fingers. No injury. FINDINGS: BONES AND JOINTS: Normal background bone mineralization for age. Degenerative changes in the left hand are less pronounced than in the wrist. No acute osseous abnormality identified. No joint dislocation. SOFT TISSUES: Soft tissue swelling at the wrist, carpal bones, proximal hand. IMPRESSION: 1. Soft tissue swelling. . No acute osseous abnormality identified in the left hand. Electronically signed by: Helayne Hurst MD 12/18/2023 08:41 AM EDT RP Workstation: [id number]   DG Wrist Complete Left Result Date: 12/18/2023 EXAM: 3 or more VIEW(S) XRAY OF THE LEFT WRIST 12/18/2023 08:32:42 AM COMPARISON: None available. CLINICAL HISTORY: 89 year old male with left wrist and hand pain, swelling, and difficulty moving first 3  fingers, starting yesterday. No injury. FINDINGS: BONES AND JOINTS: Chronic degeneration at the distal radial ulnar joint, radial carpal joint, 1st carpometacarpal joint, and the radial aspect of the carpal bones. No acute fracture or dislocation is identified about the left wrist. SOFT TISSUES: Generalized soft tissue swelling. IMPRESSION: 1. No acute fracture or dislocation identified about the left wrist. 2. Generalized soft tissue swelling. 3. Chronic degenerative changes. Electronically signed by: Helayne Hurst MD 12/18/2023 08:39 AM EDT RP Workstation: [id number]     Procedures   Medications Ordered in the ED - No data to display  Clinical Course as of 12/18/23 1639  Sun Dec 18, 2023  0951 Reviewed results of x-rays with patient and  wife.  They are comfortable plan for Ace wrap ice and pain medication.  Given contact information for hand outpatient follow-up.  Return instructions discussed [MB]    Clinical Course User Index [MB] Towana Ozell BROCKS, MD                                 Medical Decision Making Amount and/or Complexity of Data Reviewed Radiology: ordered.  Risk Prescription drug management.   This patient complains of left hand and wrist pain and swelling; this involves an extensive number of treatment Options and is a complaint that carries with it a high risk of complications and morbidity. The differential includes fracture, contusion, dislocation, infection, arthritis  I ordered medication oral pain medicine and reviewed PMP when indicated. I ordered imaging studies which included x-rays of wrist and hand and I independently    visualized and interpreted imaging which showed soft tissue swelling Additional history obtained from patient's wife Previous records obtained and reviewed in epic including recent PCP notes Social determinants considered, no significant barriers Critical Interventions: None  After the interventions stated above, I reevaluated the  patient and found patient to be nontoxic-appearing and neurovascularly intact Admission and further testing considered, no indications for admission at this time.  Will treat symptomatically having him ice and elevate.  Given contact information for outpatient hand surgery.  Return instructions discussed      Final diagnoses:  Left hand pain  Carpal tunnel syndrome of left wrist    ED Discharge Orders          Ordered    oxyCODONE -acetaminophen  (PERCOCET/ROXICET) 5-325 MG tablet  Every 6 hours PRN        12/18/23 0917               Towana Ozell BROCKS, MD 12/18/23 [phone]

## 2023-12-26 DIAGNOSIS — M19042 Primary osteoarthritis, left hand: Secondary | ICD-10-CM | POA: Diagnosis not present

## 2023-12-26 DIAGNOSIS — G5602 Carpal tunnel syndrome, left upper limb: Secondary | ICD-10-CM | POA: Diagnosis not present

## 2023-12-27 DIAGNOSIS — E1140 Type 2 diabetes mellitus with diabetic neuropathy, unspecified: Secondary | ICD-10-CM | POA: Diagnosis not present

## 2023-12-27 DIAGNOSIS — M79675 Pain in left toe(s): Secondary | ICD-10-CM | POA: Diagnosis not present

## 2023-12-27 DIAGNOSIS — I739 Peripheral vascular disease, unspecified: Secondary | ICD-10-CM | POA: Diagnosis not present

## 2023-12-27 DIAGNOSIS — M79671 Pain in right foot: Secondary | ICD-10-CM | POA: Diagnosis not present

## 2023-12-27 DIAGNOSIS — M79674 Pain in right toe(s): Secondary | ICD-10-CM | POA: Diagnosis not present

## 2023-12-27 DIAGNOSIS — M792 Neuralgia and neuritis, unspecified: Secondary | ICD-10-CM | POA: Diagnosis not present

## 2023-12-27 DIAGNOSIS — M25579 Pain in unspecified ankle and joints of unspecified foot: Secondary | ICD-10-CM | POA: Diagnosis not present

## 2023-12-27 DIAGNOSIS — M79672 Pain in left foot: Secondary | ICD-10-CM | POA: Diagnosis not present

## 2024-01-06 ENCOUNTER — Other Ambulatory Visit: Payer: Self-pay | Admitting: Cardiology

## 2024-01-06 DIAGNOSIS — I482 Chronic atrial fibrillation, unspecified: Secondary | ICD-10-CM | POA: Diagnosis not present

## 2024-01-06 DIAGNOSIS — I10 Essential (primary) hypertension: Secondary | ICD-10-CM | POA: Diagnosis not present

## 2024-01-06 DIAGNOSIS — E119 Type 2 diabetes mellitus without complications: Secondary | ICD-10-CM | POA: Diagnosis not present

## 2024-01-09 DIAGNOSIS — M19042 Primary osteoarthritis, left hand: Secondary | ICD-10-CM | POA: Diagnosis not present

## 2024-01-09 DIAGNOSIS — G5602 Carpal tunnel syndrome, left upper limb: Secondary | ICD-10-CM | POA: Diagnosis not present

## 2024-01-09 DIAGNOSIS — M79642 Pain in left hand: Secondary | ICD-10-CM | POA: Diagnosis not present

## 2024-01-10 DIAGNOSIS — Z23 Encounter for immunization: Secondary | ICD-10-CM | POA: Diagnosis not present

## 2024-01-29 DIAGNOSIS — G5602 Carpal tunnel syndrome, left upper limb: Secondary | ICD-10-CM | POA: Diagnosis not present

## 2024-01-29 DIAGNOSIS — E1142 Type 2 diabetes mellitus with diabetic polyneuropathy: Secondary | ICD-10-CM | POA: Diagnosis not present

## 2024-02-03 DIAGNOSIS — E119 Type 2 diabetes mellitus without complications: Secondary | ICD-10-CM | POA: Diagnosis not present

## 2024-02-03 DIAGNOSIS — I10 Essential (primary) hypertension: Secondary | ICD-10-CM | POA: Diagnosis not present

## 2024-02-03 DIAGNOSIS — I482 Chronic atrial fibrillation, unspecified: Secondary | ICD-10-CM | POA: Diagnosis not present

## 2024-02-07 DIAGNOSIS — M1812 Unilateral primary osteoarthritis of first carpometacarpal joint, left hand: Secondary | ICD-10-CM | POA: Diagnosis not present

## 2024-02-07 DIAGNOSIS — G5602 Carpal tunnel syndrome, left upper limb: Secondary | ICD-10-CM | POA: Diagnosis not present

## 2024-02-08 ENCOUNTER — Telehealth (HOSPITAL_BASED_OUTPATIENT_CLINIC_OR_DEPARTMENT_OTHER): Payer: Self-pay

## 2024-02-08 NOTE — Telephone Encounter (Signed)
   Pre-operative Risk Assessment    Patient Name: Richard [name]  DOB: 1933-06-07 MRN: [id number]   Date of last office visit: 09/14/23 with Debera Date of next office visit: 03/21/24 with Debera  Request for Surgical Clearance    Procedure:  left endoscopic carpal tunnel release   Date of Surgery:  Clearance 02/22/24                                 Surgeon:  Dr. Sebastian Surgeon's Group or Practice Name:  [facility] Phone number:  [phone] Fax number:  [phone]   Type of Clearance Requested:   - Medical  - Pharmacy:  Hold Apixaban  (Eliquis ) not indicated   Type of Anesthesia:  choice   Additional requests/questions:    [name]   02/08/2024, 3:23 PM

## 2024-02-09 ENCOUNTER — Telehealth (HOSPITAL_BASED_OUTPATIENT_CLINIC_OR_DEPARTMENT_OTHER): Payer: Self-pay | Admitting: *Deleted

## 2024-02-09 NOTE — Telephone Encounter (Signed)
 I tried the #'s on file for both the pt and his wife but neither # working. I s/w DPR pt's son Darin who said they got rid of their land line and got new cell phones, new # for the pt is [phone] and pt's wife Jenkins cell # [phone]. Darin said he will call his dad and let them know I am going to be calling to schedule tele preop appt.   Left message for the pt to call back to schedule tele preop appt.

## 2024-02-09 NOTE — Telephone Encounter (Signed)
 I was sent a secure chat from Alan Corona the pt walked in the office and said he has called multiple #'s for over an hour trying to reach our office to s/w the preop team to schedule a tele preop appt. While pt was there with Alan Corona, we scheduled the pt tele preop appt 02/16/24 @ 10 am. Pt agreed. Consent done, but meds to be reviewed during tele appt.       Patient Consent for Virtual Visit        Richard [name] has provided verbal consent on 02/09/2024 for a virtual visit (video or telephone).   CONSENT FOR VIRTUAL VISIT FOR:  Richard [name]  By participating in this virtual visit I agree to the following:  I hereby voluntarily request, consent and authorize [facility] and its employed or contracted physicians, physician assistants, nurse practitioners or other licensed health care professionals (the Practitioner), to provide me with telemedicine health care services (the "Services) as deemed necessary by the treating Practitioner. I acknowledge and consent to receive the Services by the Practitioner via telemedicine. I understand that the telemedicine visit will involve communicating with the Practitioner through live audiovisual communication technology and the disclosure of certain medical information by electronic transmission. I acknowledge that I have been given the opportunity to request an in-person assessment or other available alternative prior to the telemedicine visit and am voluntarily participating in the telemedicine visit.  I understand that I have the right to withhold or withdraw my consent to the use of telemedicine in the course of my care at any time, without affecting my right to future care or treatment, and that the Practitioner or I may terminate the telemedicine visit at any time. I understand that I have the right to inspect all information obtained and/or recorded in the course of the telemedicine visit and may receive copies of available  information for a reasonable fee.  I understand that some of the potential risks of receiving the Services via telemedicine include:  Delay or interruption in medical evaluation due to technological equipment failure or disruption; Information transmitted may not be sufficient (e.g. poor resolution of images) to allow for appropriate medical decision making by the Practitioner; and/or  In rare instances, security protocols could fail, causing a breach of personal health information.  Furthermore, I acknowledge that it is my responsibility to provide information about my medical history, conditions and care that is complete and accurate to the best of my ability. I acknowledge that Practitioner's advice, recommendations, and/or decision may be based on factors not within their control, such as incomplete or inaccurate data provided by me or distortions of diagnostic images or specimens that may result from electronic transmissions. I understand that the practice of medicine is not an exact science and that Practitioner makes no warranties or guarantees regarding treatment outcomes. I acknowledge that a copy of this consent can be made available to me via my patient portal [facility] MyChart), or I can request a printed copy by calling the office of [facility].    I understand that my insurance will be billed for this visit.   I have read or had this consent read to me. I understand the contents of this consent, which adequately explains the benefits and risks of the Services being provided via telemedicine.  I have been provided ample opportunity to ask questions regarding this consent and the Services and have had my questions answered to my satisfaction. I give my  informed consent for the services to be provided through the use of telemedicine in my medical care

## 2024-02-09 NOTE — Telephone Encounter (Signed)
 I was sent a secure chat from Alan Corona the pt walked in the office and said he has called multiple #'s for over an hour trying to reach our office to s/w the preop team to schedule a tele preop appt. While pt was there with Alan Corona, we scheduled the pt tele preop appt 02/16/24 @ 10 am. Pt agreed. Consent done, but meds to be reviewed during tele appt.

## 2024-02-09 NOTE — Telephone Encounter (Signed)
 Patient with diagnosis of PAF on Eliquis  for anticoagulation.    Procedure:   left endoscopic carpal tunnel release  Date of procedure: 02/22/24     CHA2DS2-VASc Score = 5   This indicates a 7.2% annual risk of stroke. The patient's score is based upon: CHF History: 0 HTN History: 1 Diabetes History: 1 Stroke History: 0 Vascular Disease History: 1 Age Score: 2 Gender Score: 0      CrCl 78 mL/min  Platelet 118 K (06/09/23 -[facility])   Patient has not had an Afib/aflutter ablation in the last 3 months, DCCV within the last 4 weeks or a watchman implanted in the last 45 days     Per office protocol, patient can hold Eliquis  for 2 days prior to procedure.   Patient will not need bridging with Lovenox (enoxaparin) around procedure.  **This guidance is not considered finalized until pre-operative APP has relayed final recommendations.**

## 2024-02-09 NOTE — Telephone Encounter (Signed)
   Name: Richard [name]  DOB: 10/30/1933  MRN: [id number]  Primary Cardiologist: Jayson Sierras, MD   Preoperative team, please contact this patient and set up a phone call appointment for further preoperative risk assessment. Please obtain consent and complete medication review. Thank you for your help. Procedure scheduled for 02/22/2024. In office appt with Dr. Sierras is in January.   I confirm that guidance regarding antiplatelet and oral anticoagulation therapy has been completed and, if necessary, noted below.  Pharmacy 02/09/2024 Per office protocol, patient can hold Eliquis  for 2 days prior to procedure.  Patient will not need bridging with Lovenox (enoxaparin) around procedure.  I also confirmed the patient resides in the state of [place] . As per [employer] Medical Board telemedicine laws, the patient must reside in the state in which the provider is licensed.   Lamarr Satterfield, NP 02/09/2024, 9:20 AM [facility]

## 2024-02-16 ENCOUNTER — Ambulatory Visit: Attending: Internal Medicine | Admitting: Emergency Medicine

## 2024-02-16 DIAGNOSIS — Z01810 Encounter for preprocedural cardiovascular examination: Secondary | ICD-10-CM | POA: Diagnosis not present

## 2024-02-16 NOTE — Progress Notes (Signed)
 Virtual Visit via Telephone Note   Because of Richard [name] co-morbid illnesses, he is at least at moderate risk for complications without adequate follow up.  This format is felt to be most appropriate for this patient at this time.  Due to technical limitations with video connection (technology), today's appointment will be conducted as an audio only telehealth visit, and Richard [name] verbally agreed to proceed in this manner.   All issues noted in this document were discussed and addressed.  No physical exam could be performed with this format.  Evaluation Performed:  Preoperative cardiovascular risk assessment _____________   Date:  02/16/2024   Patient ID:  Richard [name], DOB 1933-07-07, MRN [id number] Patient Location:  Home Provider location:   Office  Primary Care Provider:  [facility] Primary Cardiologist:  Jayson Sierras, MD  Chief Complaint / Patient Profile   89 y.o. y/o male with a h/o coronary artery disease, permanent atrial fibrillation, mitral regurgitation, primary hypertension, mixed hyperlipidemia who is pending left endoscopic carpal tunnel release on 02/22/2024 with [facility] and presents today for telephonic preoperative cardiovascular risk assessment.  History of Present Illness    Richard [name] is a 89 y.o. male who presents via audio/video conferencing for a telehealth visit today.  Pt was last seen in cardiology clinic on 09/14/2023 by Dr. Sierras.  At that time Richard [name] was doing well.  The patient is now pending procedure as outlined above. Since his last visit, he denies chest pain, lower extremity edema, fatigue, palpitations, melena, hematuria, hemoptysis, diaphoresis, weakness, presyncope, syncope, orthopnea, and PND.  Today patient tells me he is doing well without acute cardiovascular concerns or complaints.  He is without any chest pains.  Remains asymptomatic from atrial fibrillation standpoint.   He continues to remain active as he does yard work, maintains a garden, fishes, and camps without exertional symptoms.  No symptoms to suggest active angina.  He is able to complete greater than 4 METS.  Past Medical History    Past Medical History:  Diagnosis Date   Atrial fibrillation (HCC)    CAD (coronary artery disease)    Mild-moderate LM with occluded RCA (collaterals) - managed medically 2013   GERD (gastroesophageal reflux disease)    History of cardioversion    4-5 years ago   Hypertensive heart disease    Obesity (BMI 30.0-34.9)    Old inferior myocardial infarction 1995   PAF (paroxysmal atrial fibrillation) (HCC)    Pulmonary embolus (HCC)    [name] 2015   Past Surgical History:  Procedure Laterality Date   ACHILLES TENDON REPAIR     APPENDECTOMY     LEFT HEART CATHETERIZATION WITH CORONARY ANGIOGRAM N/A 01/24/2012   Procedure: LEFT HEART CATHETERIZATION WITH CORONARY ANGIOGRAM;  Surgeon: Elsie GORMAN Somerset, MD;  Location: [facility] CATH LAB;  Service: Cardiovascular;  Laterality: N/A;   SHOULDER SURGERY      Allergies  Allergies[1]  Home Medications    Prior to Admission medications  Medication Sig Start Date End Date Taking? Authorizing Provider  acetaminophen  (TYLENOL ) 500 MG tablet Take 1,000 mg every 6 (six) hours as needed by mouth for mild pain or headache.    [provider]  albuterol  (VENTOLIN  HFA) 108 (90 Base) MCG/ACT inhaler Inhale 2 puffs into the lungs every 4 (four) hours as needed for wheezing or shortness of breath (cough, shortness of breath or wheezing.). 03/15/22   Johnson, Clanford L, MD  atorvastatin  (LIPITOR) 80 MG tablet Take 1  tablet by mouth in the evening 03/08/23   Debera Jayson MATSU, MD  dapagliflozin  propanediol (FARXIGA ) 10 MG TABS tablet Take 10 mg by mouth daily.    [provider]  diclofenac Sodium (VOLTAREN) 1 % GEL Apply 2 g topically 2 (two) times daily as needed (pain). 08/18/21   [provider]  diltiazem   (CARDIZEM  CD) 240 MG 24 hr capsule Take 1 capsule (240 mg total) by mouth daily. 05/13/17 09/13/96  Debera Jayson MATSU, MD  ELIQUIS  5 MG TABS tablet TAKE 1 TABLET BY MOUTH TWICE DAILY 01/26/18   Debera Jayson MATSU, MD  furosemide  (LASIX ) 20 MG tablet Take 20 mg daily as needed for leg swelling 06/10/22   Debera Jayson MATSU, MD  gabapentin  (NEURONTIN ) 300 MG capsule Take 300 mg by mouth 2 (two) times daily. 10/20/17   [provider]  isosorbide  mononitrate (IMDUR ) 30 MG 24 hr tablet TAKE 1 & 1/2 (ONE & ONE-HALF) TABLETS BY MOUTH ONCE DAILY 04/20/23   Debera Jayson MATSU, MD  losartan  (COZAAR ) 25 MG tablet Take 1 tablet (25 mg total) by mouth daily. 01/17/23 01/12/24  Debera Jayson MATSU, MD  metFORMIN  (GLUCOPHAGE -XR) 500 MG 24 hr tablet Take 500 mg by mouth 2 (two) times daily.     [provider]  metoprolol  succinate (TOPROL -XL) 50 MG 24 hr tablet Take 1 tablet (50 mg total) by mouth in the morning and at bedtime. WITH OR IMMEDIATELY FOLLOWING A MEAL 01/06/24   Debera Jayson MATSU, MD  Multiple Vitamins-Minerals (PRESERVISION AREDS 2 PO) Take 2 tablets by mouth daily.    [provider]  nitroGLYCERIN  (NITROSTAT ) 0.4 MG SL tablet DISSOLVE ONE TABLET UNDER THE TONGUE EVERY 5 MINUTES AS NEEDED FOR CHEST PAIN.  DO NOT EXCEED A TOTAL OF 3 DOSES IN 15 MINUTES 12/17/22   Debera Jayson MATSU, MD  oxyCODONE -acetaminophen  (PERCOCET/ROXICET) 5-325 MG tablet Take 1 tablet by mouth every 6 (six) hours as needed for severe pain (pain score 7-10). 12/18/23   Towana Ozell BROCKS, MD    Physical Exam    Vital Signs:  Richard [name] does not have vital signs available for review today.  Given telephonic nature of communication, physical exam is limited. AAOx3. NAD. Normal affect.  Speech and respirations are unlabored.  Accessory Clinical Findings    None  Assessment & Plan    1.  Preoperative Cardiovascular Risk Assessment: According to the Revised Cardiac Risk Index (RCRI), his  Perioperative Risk of Major Cardiac Event is (%): 0.9. His Functional Capacity in METs is: 5.62 according to the Duke Activity Status Index (DASI).  Therefore, based on ACC/AHA guidelines, patient would be at acceptable risk for the planned procedure without further cardiovascular testing. I will route this recommendation to the requesting party via Epic fax function.  The patient was advised that if he develops new symptoms prior to surgery to contact our office to arrange for a follow-up visit, and he verbalized understanding.  Per office protocol, patient can hold Eliquis  for 2 days prior to procedure.   Patient will not need bridging with Lovenox (enoxaparin) around procedure.  A copy of this note will be routed to requesting surgeon.  Time:   Today, I have spent 10 minutes with the patient with telehealth technology discussing medical history, symptoms, and management plan.     Lum LITTIE Louis, NP  02/16/2024, 10:05 AM     [1]  Allergies Allergen Reactions   Niaspan [Niacin Er (Antihyperlipidemic)]     flushing

## 2024-02-20 ENCOUNTER — Other Ambulatory Visit: Payer: Self-pay | Admitting: Cardiology

## 2024-02-22 DIAGNOSIS — G5602 Carpal tunnel syndrome, left upper limb: Secondary | ICD-10-CM | POA: Diagnosis not present

## 2024-03-19 ENCOUNTER — Other Ambulatory Visit: Payer: Self-pay | Admitting: Cardiology

## 2024-03-20 ENCOUNTER — Other Ambulatory Visit: Payer: Self-pay | Admitting: Cardiology

## 2024-03-20 MED ORDER — ATORVASTATIN CALCIUM 80 MG PO TABS: 80.0000 mg | ORAL_TABLET | Freq: Every evening | ORAL | 2 refills | Status: AC

## 2024-03-21 ENCOUNTER — Encounter: Payer: Self-pay | Admitting: Cardiology

## 2024-03-21 ENCOUNTER — Ambulatory Visit: Attending: Cardiology | Admitting: Cardiology

## 2024-03-21 VITALS — BP 138/86 | HR 65 | Ht 68.0 in | Wt 187.0 lb

## 2024-03-21 DIAGNOSIS — I340 Nonrheumatic mitral (valve) insufficiency: Secondary | ICD-10-CM | POA: Insufficient documentation

## 2024-03-21 DIAGNOSIS — I10 Essential (primary) hypertension: Secondary | ICD-10-CM | POA: Insufficient documentation

## 2024-03-21 DIAGNOSIS — I4821 Permanent atrial fibrillation: Secondary | ICD-10-CM | POA: Insufficient documentation

## 2024-03-21 DIAGNOSIS — I25119 Atherosclerotic heart disease of native coronary artery with unspecified angina pectoris: Secondary | ICD-10-CM | POA: Diagnosis not present

## 2024-03-21 NOTE — Progress Notes (Signed)
"  ° ° °  Cardiology Office Note  Date: 03/21/2024   ID: Richard [name], DOB 19-Oct-1933, MRN [id number]  History of Present Illness: Richard [name] is an 89 y.o. male last seen in July 2025.  He is here for a routine visit.  He does not report any sense of palpitations, no angina.  Describes NYHA class II-III dyspnea depending on level of activity.  Indicates that he can do chores and ADLs without difficulty as long as he is able to rest periodically.  No dizziness or syncope.  No recent falls.  We went over his medications today.  He reports compliance with current regimen, no spontaneous bleeding problems on Eliquis .  States that he just had lab work at [facility], we are requesting the results.  No interval use of nitroglycerin .  We discussed getting a follow-up echocardiogram to follow-up on mitral regurgitation and LVEF.  Physical Exam: VS:  BP 138/86 (BP Location: Right Arm, Cuff Size: Normal)   Pulse 65   Ht 5' 8 (1.727 m)   Wt 187 lb (84.8 kg)   SpO2 99%   BMI 28.43 kg/m , BMI Body mass index is 28.43 kg/m.  Wt Readings from Last 3 Encounters:  03/21/24 187 lb (84.8 kg)  12/18/23 180 lb (81.6 kg)  09/14/23 185 lb 9.6 oz (84.2 kg)    General: Patient appears comfortable at rest. HEENT: Conjunctiva and lids normal. Neck: Supple, no elevated JVP or carotid bruits. Lungs: Clear to auscultation, nonlabored breathing at rest. Cardiac: Irregularly irregular, 1/6 systolic murmur without gallop. Extremities: No pitting edema.  ECG:  An ECG dated 09/14/2023 was personally reviewed today and demonstrated:  Rate controlled atrial fibrillation with nonspecific ST-T changes.  Labwork:  August 2025: Hemoglobin 14.3, platelets 118 September 2025: Potassium 4.5, BUN 15, creatinine 0.76, GFR 86  Other Studies Reviewed Today:  No interval cardiac testing for review today.  Assessment and Plan:  1.  CAD with occluded RCA associated with collaterals and mild to moderate residual  left system disease managed medically as of 2013.  LVEF 50% by echocardiogram in March 2023.  Ischemic testing via Lexiscan  Myoview  in March 2023 indicated inferior infarct scar with mild peri-infarct ischemia.  He remains symptomatically stable with no definite angina, remains functional with ADLs and chores.  Plan to continue observation on medical therapy which includes Imdur  30 mg daily, Farxiga  10 mg daily, statin, and as needed nitroglycerin .   2.  Permanent atrial fibrillation with CHA2DS2-VASc score of 3.  Heart rate controlled on Cardizem  CD 240 mg daily as well as Toprol -XL 50 mg twice daily.  Asymptomatic in terms of palpitations.  Continue Eliquis  5 mg twice daily for stroke prophylaxis.  Requesting interval lab work from [facility].   3.  Mitral regurgitation, moderate by echocardiogram in March 2023.  Plan to update echocardiogram.   4.  Primary hypertension.  No changes made to current regimen which also includes Cozaar  25 mg daily.   5.  Mixed hyperlipidemia.  LDL 39 in April 2024 on Lipitor 80 mg daily.  Disposition:  Follow up 6 months.  Signed, Jayson JUDITHANN Sierras, M.D., F.A.C.C. [facility] "

## 2024-03-21 NOTE — Patient Instructions (Addendum)
 Medication Instructions:   Your physician recommends that you continue on your current medications as directed. Please refer to the Current Medication list given to you today.   Labwork: None today  Testing/Procedures: Your physician has requested that you have an echocardiogram. Echocardiography is a painless test that uses sound waves to create images of your heart. It provides your doctor with information about the size and shape of your heart and how well your hearts chambers and valves are working. This procedure takes approximately one hour. There are no restrictions for this procedure. Please do NOT wear cologne, perfume, aftershave, or lotions (deodorant is allowed). Please arrive 15 minutes prior to your appointment time.  Please note: We ask at that you not bring children with you during ultrasound (echo/ vascular) testing. Due to room size and safety concerns, children are not allowed in the ultrasound rooms during exams. Our front office staff cannot provide observation of children in our lobby area while testing is being conducted. An adult accompanying a patient to their appointment will only be allowed in the ultrasound room at the discretion of the ultrasound technician under special circumstances. We apologize for any inconvenience.   Follow-Up: 6 months Dr.McDowell  Any Other Special Instructions Will Be Listed Below (If Applicable).  If you need a refill on your cardiac medications before your next appointment, please call your pharmacy.

## 2024-03-27 ENCOUNTER — Ambulatory Visit (HOSPITAL_COMMUNITY)
Admission: RE | Admit: 2024-03-27 | Discharge: 2024-03-27 | Disposition: A | Discharge status: Home / Self Care | Source: Ambulatory Visit | Attending: Cardiology | Admitting: Cardiology

## 2024-03-27 ENCOUNTER — Ambulatory Visit: Payer: Self-pay | Admitting: Cardiology

## 2024-03-27 DIAGNOSIS — I340 Nonrheumatic mitral (valve) insufficiency: Secondary | ICD-10-CM | POA: Diagnosis not present

## 2024-03-27 LAB — ECHOCARDIOGRAM COMPLETE
AR max vel: 4.01 cm2
AV Area VTI: 4.16 cm2
AV Area mean vel: 3.99 cm2
AV Mean grad: 3.6 mmHg
AV Peak grad: 7.3 mmHg
Ao pk vel: 1.35 m/s
Area-P 1/2: 5.46 cm2
S' Lateral: 3.5 cm

## 2024-03-27 NOTE — Progress Notes (Signed)
 Echocardiogram 2D Echocardiogram has been performed.  Juliene JINNY Rucks 03/27/2024, 11:27 AM
